# Patient Record
Sex: Male | Born: 1950 | Race: White | Hispanic: No | Marital: Married | Smoking: Former smoker
Health system: Southern US, Community
[De-identification: ages and names within clinical notes are randomized; demographics above are authoritative.]

## PROBLEM LIST (undated history)

## (undated) DIAGNOSIS — I739 Peripheral vascular disease, unspecified: Secondary | ICD-10-CM

## (undated) DIAGNOSIS — I714 Abdominal aortic aneurysm, without rupture, unspecified: Secondary | ICD-10-CM

## (undated) DIAGNOSIS — I10 Essential (primary) hypertension: Secondary | ICD-10-CM

## (undated) DIAGNOSIS — I509 Heart failure, unspecified: Secondary | ICD-10-CM

## (undated) DIAGNOSIS — I214 Non-ST elevation (NSTEMI) myocardial infarction: Secondary | ICD-10-CM

## (undated) DIAGNOSIS — D696 Thrombocytopenia, unspecified: Secondary | ICD-10-CM

## (undated) DIAGNOSIS — IMO0001 Reserved for inherently not codable concepts without codable children: Secondary | ICD-10-CM

## (undated) DIAGNOSIS — Z8719 Personal history of other diseases of the digestive system: Secondary | ICD-10-CM

## (undated) DIAGNOSIS — Z720 Tobacco use: Secondary | ICD-10-CM

## (undated) DIAGNOSIS — I2510 Atherosclerotic heart disease of native coronary artery without angina pectoris: Secondary | ICD-10-CM

## (undated) HISTORY — DX: Atherosclerotic heart disease of native coronary artery without angina pectoris: I25.10

## (undated) HISTORY — PX: CARDIAC CATHETERIZATION: SHX172

## (undated) HISTORY — DX: Heart failure, unspecified: I50.9

## (undated) HISTORY — PX: HERNIA REPAIR: SHX51

## (undated) HISTORY — PX: CORONARY STENT PLACEMENT: SHX1402

## (undated) HISTORY — DX: Abdominal aortic aneurysm, without rupture: I71.4

## (undated) HISTORY — DX: Peripheral vascular disease, unspecified: I73.9

## (undated) HISTORY — DX: Abdominal aortic aneurysm, without rupture, unspecified: I71.40

---

## 2007-02-10 ENCOUNTER — Ambulatory Visit: Payer: Self-pay | Admitting: Gastroenterology

## 2010-03-17 ENCOUNTER — Ambulatory Visit: Payer: Self-pay | Admitting: Surgery

## 2012-05-28 ENCOUNTER — Emergency Department (HOSPITAL_COMMUNITY)
Admission: EM | Admit: 2012-05-28 | Discharge: 2012-05-28 | Discharge status: Against Medical Advice | Payer: BC Managed Care – PPO | Attending: Emergency Medicine | Admitting: Emergency Medicine

## 2012-05-28 ENCOUNTER — Inpatient Hospital Stay (HOSPITAL_COMMUNITY)
Admission: EM | Admit: 2012-05-28 | Discharge: 2012-05-31 | DRG: 808 | Disposition: A | Discharge status: Home / Self Care | Payer: BC Managed Care – PPO | Attending: Cardiology | Admitting: Cardiology

## 2012-05-28 ENCOUNTER — Encounter (HOSPITAL_COMMUNITY): Payer: Self-pay

## 2012-05-28 ENCOUNTER — Emergency Department (HOSPITAL_COMMUNITY): Payer: BC Managed Care – PPO

## 2012-05-28 DIAGNOSIS — R079 Chest pain, unspecified: Secondary | ICD-10-CM | POA: Insufficient documentation

## 2012-05-28 DIAGNOSIS — F17200 Nicotine dependence, unspecified, uncomplicated: Secondary | ICD-10-CM | POA: Diagnosis present

## 2012-05-28 DIAGNOSIS — Z955 Presence of coronary angioplasty implant and graft: Secondary | ICD-10-CM

## 2012-05-28 DIAGNOSIS — I214 Non-ST elevation (NSTEMI) myocardial infarction: Principal | ICD-10-CM

## 2012-05-28 DIAGNOSIS — I10 Essential (primary) hypertension: Secondary | ICD-10-CM

## 2012-05-28 DIAGNOSIS — Z8249 Family history of ischemic heart disease and other diseases of the circulatory system: Secondary | ICD-10-CM

## 2012-05-28 DIAGNOSIS — I2510 Atherosclerotic heart disease of native coronary artery without angina pectoris: Secondary | ICD-10-CM | POA: Diagnosis present

## 2012-05-28 DIAGNOSIS — I739 Peripheral vascular disease, unspecified: Secondary | ICD-10-CM | POA: Diagnosis present

## 2012-05-28 DIAGNOSIS — D72819 Decreased white blood cell count, unspecified: Secondary | ICD-10-CM | POA: Diagnosis not present

## 2012-05-28 DIAGNOSIS — Z720 Tobacco use: Secondary | ICD-10-CM

## 2012-05-28 DIAGNOSIS — D696 Thrombocytopenia, unspecified: Secondary | ICD-10-CM | POA: Diagnosis present

## 2012-05-28 HISTORY — DX: Tobacco use: Z72.0

## 2012-05-28 HISTORY — DX: Essential (primary) hypertension: I10

## 2012-05-28 HISTORY — DX: Thrombocytopenia, unspecified: D69.6

## 2012-05-28 HISTORY — DX: Non-ST elevation (NSTEMI) myocardial infarction: I21.4

## 2012-05-28 LAB — POCT I-STAT TROPONIN I
Troponin i, poc: 0.12 ng/mL (ref 0.00–0.08)
Troponin i, poc: 3.67 ng/mL (ref 0.00–0.08)

## 2012-05-28 LAB — CK TOTAL AND CKMB (NOT AT ARMC)
CK, MB: 19.5 ng/mL (ref 0.3–4.0)
CK, MB: 14.4 ng/mL (ref 0.3–4.0)
Relative Index: 8.7 — ABNORMAL HIGH (ref 0.0–2.5)
Total CK: 199 U/L (ref 7–232)
Total CK: 165 U/L (ref 7–232)

## 2012-05-28 LAB — CBC
MCH: 37.1 pg — ABNORMAL HIGH (ref 26.0–34.0)
MCHC: 35.4 g/dL (ref 30.0–36.0)
MCV: 104.8 fL — ABNORMAL HIGH (ref 78.0–100.0)
MCV: 104.7 fL — ABNORMAL HIGH (ref 78.0–100.0)
Platelets: 131 10*3/uL — ABNORMAL LOW (ref 150–400)
Platelets: 116 10*3/uL — ABNORMAL LOW (ref 150–400)
RBC: 3.97 MIL/uL — ABNORMAL LOW (ref 4.22–5.81)
RDW: 12.7 % (ref 11.5–15.5)
RDW: 12.8 % (ref 11.5–15.5)
WBC: 4.1 10*3/uL (ref 4.0–10.5)

## 2012-05-28 LAB — HEPARIN LEVEL (UNFRACTIONATED): Heparin Unfractionated: 0.34 IU/mL (ref 0.30–0.70)

## 2012-05-28 LAB — COMPREHENSIVE METABOLIC PANEL
ALT: 24 U/L (ref 0–53)
AST: 38 U/L — ABNORMAL HIGH (ref 0–37)
Albumin: 3.7 g/dL (ref 3.5–5.2)
Alkaline Phosphatase: 98 U/L (ref 39–117)
CO2: 24 mEq/L (ref 19–32)
Chloride: 96 mEq/L (ref 96–112)
GFR calc non Af Amer: 54 mL/min — ABNORMAL LOW (ref >90)
Potassium: 3.9 mEq/L (ref 3.5–5.1)
Sodium: 134 mEq/L — ABNORMAL LOW (ref 135–145)
Total Bilirubin: 0.4 mg/dL (ref 0.3–1.2)

## 2012-05-28 LAB — URINALYSIS, ROUTINE W REFLEX MICROSCOPIC
Glucose, UA: NEGATIVE mg/dL
Hgb urine dipstick: NEGATIVE
Leukocytes, UA: NEGATIVE
Protein, ur: NEGATIVE mg/dL
Specific Gravity, Urine: 1.005 (ref 1.005–1.030)

## 2012-05-28 LAB — HEMOGLOBIN A1C: Hgb A1c MFr Bld: 5 % (ref <5.7)

## 2012-05-28 LAB — TSH: TSH: 2.018 u[IU]/mL (ref 0.350–4.500)

## 2012-05-28 LAB — BASIC METABOLIC PANEL
CO2: 25 mEq/L (ref 19–32)
Chloride: 97 mEq/L (ref 96–112)
Creatinine, Ser: 1.34 mg/dL (ref 0.50–1.35)
GFR calc Af Amer: 64 mL/min — ABNORMAL LOW (ref >90)
Potassium: 3.8 mEq/L (ref 3.5–5.1)

## 2012-05-28 LAB — TROPONIN I: Troponin I: 0.33 ng/mL (ref <0.30)

## 2012-05-28 MED ORDER — NICOTINE 14 MG/24HR TD PT24
14.0000 mg | MEDICATED_PATCH | Freq: Every day | TRANSDERMAL | Status: DC
  Administered 2012-05-28 – 2012-05-31 (×3): 14 mg via TRANSDERMAL
  Filled 2012-05-28 (×4): qty 1

## 2012-05-28 MED ORDER — CLOPIDOGREL BISULFATE 300 MG PO TABS
300.0000 mg | ORAL_TABLET | Freq: Once | ORAL | Status: AC
  Administered 2012-05-28: 300 mg via ORAL
  Filled 2012-05-28: qty 1

## 2012-05-28 MED ORDER — HEPARIN (PORCINE) IN NACL 100-0.45 UNIT/ML-% IJ SOLN: 12.0000 [IU]/kg/h | INTRAMUSCULAR | Status: DC

## 2012-05-28 MED ORDER — ONDANSETRON HCL 4 MG/2ML IJ SOLN: 4.0000 mg | Freq: Four times a day (QID) | INTRAMUSCULAR | Status: DC | PRN

## 2012-05-28 MED ORDER — ACETAMINOPHEN 325 MG PO TABS
650.0000 mg | ORAL_TABLET | Freq: Once | ORAL | Status: AC
  Administered 2012-05-28: 650 mg via ORAL
  Filled 2012-05-28: qty 2

## 2012-05-28 MED ORDER — ATORVASTATIN CALCIUM 80 MG PO TABS
80.0000 mg | ORAL_TABLET | Freq: Every day | ORAL | Status: DC
  Administered 2012-05-28 – 2012-05-29 (×2): 80 mg via ORAL
  Filled 2012-05-28 (×3): qty 1

## 2012-05-28 MED ORDER — METOPROLOL TARTRATE 1 MG/ML IV SOLN
5.0000 mg | Freq: Once | INTRAVENOUS | Status: AC
  Administered 2012-05-28: 5 mg via INTRAVENOUS
  Filled 2012-05-28: qty 5

## 2012-05-28 MED ORDER — METOPROLOL TARTRATE 1 MG/ML IV SOLN
5.0000 mg | INTRAVENOUS | Status: AC | PRN
  Administered 2012-05-28 (×3): 5 mg via INTRAVENOUS
  Filled 2012-05-28: qty 15

## 2012-05-28 MED ORDER — LORAZEPAM 1 MG PO TABS: 1.0000 mg | ORAL_TABLET | Freq: Four times a day (QID) | ORAL | Status: DC | PRN

## 2012-05-28 MED ORDER — ACETAMINOPHEN 325 MG PO TABS
650.0000 mg | ORAL_TABLET | ORAL | Status: DC | PRN
  Administered 2012-05-28 – 2012-05-29 (×5): 650 mg via ORAL
  Filled 2012-05-28 (×5): qty 2

## 2012-05-28 MED ORDER — HEPARIN (PORCINE) IN NACL 100-0.45 UNIT/ML-% IJ SOLN
750.0000 [IU]/h | INTRAMUSCULAR | Status: DC
  Administered 2012-05-28 – 2012-05-29 (×2): 750 [IU]/h via INTRAVENOUS
  Filled 2012-05-28 (×4): qty 250

## 2012-05-28 MED ORDER — NITROGLYCERIN IN D5W 200-5 MCG/ML-% IV SOLN
20.0000 ug/min | Freq: Once | INTRAVENOUS | Status: AC
  Administered 2012-05-28: 20 ug/min via INTRAVENOUS
  Filled 2012-05-28: qty 250

## 2012-05-28 MED ORDER — NITROGLYCERIN IN D5W 200-5 MCG/ML-% IV SOLN
20.0000 ug/min | INTRAVENOUS | Status: DC
  Administered 2012-05-30: 10 ug/min via INTRAVENOUS
  Filled 2012-05-28: qty 250

## 2012-05-28 MED ORDER — EPTIFIBATIDE 75 MG/100ML IV SOLN
1.0000 ug/kg/min | INTRAVENOUS | Status: DC
  Administered 2012-05-28 – 2012-05-29 (×3): 1 ug/kg/min via INTRAVENOUS
  Filled 2012-05-28 (×6): qty 100

## 2012-05-28 MED ORDER — LABETALOL HCL 5 MG/ML IV SOLN: 20.0000 mg | Freq: Four times a day (QID) | INTRAVENOUS | Status: DC | PRN

## 2012-05-28 MED ORDER — LISINOPRIL 5 MG PO TABS
5.0000 mg | ORAL_TABLET | Freq: Every day | ORAL | Status: DC
  Administered 2012-05-28 – 2012-05-31 (×3): 5 mg via ORAL
  Filled 2012-05-28 (×4): qty 1

## 2012-05-28 MED ORDER — HEPARIN BOLUS VIA INFUSION: 60.0000 [IU]/kg | Freq: Once | INTRAVENOUS | Status: DC

## 2012-05-28 MED ORDER — ONDANSETRON HCL 4 MG PO TABS: 4.0000 mg | ORAL_TABLET | Freq: Four times a day (QID) | ORAL | Status: DC | PRN

## 2012-05-28 MED ORDER — HEPARIN BOLUS VIA INFUSION
4000.0000 [IU] | Freq: Once | INTRAVENOUS | Status: AC
  Administered 2012-05-28: 4000 [IU] via INTRAVENOUS

## 2012-05-28 MED ORDER — METOPROLOL TARTRATE 25 MG PO TABS: 25.0000 mg | ORAL_TABLET | Freq: Two times a day (BID) | ORAL | Status: DC

## 2012-05-28 MED ORDER — CARVEDILOL 3.125 MG PO TABS
3.1250 mg | ORAL_TABLET | Freq: Two times a day (BID) | ORAL | Status: DC
  Administered 2012-05-28 – 2012-05-30 (×5): 3.125 mg via ORAL
  Filled 2012-05-28 (×8): qty 1

## 2012-05-28 NOTE — Progress Notes (Signed)
ANTICOAGULATION CONSULT NOTE - Follow-up  Pharmacy Consult for heparin + integrilin Indication: chest pain/ACS  Allergies  Allergen Reactions  . Codeine Itching and Rash    Patient Measurements: Height: 5\' 11"  (180.3 cm) Weight: 140 lb (63.504 kg) IBW/kg (Calculated) : 75.3  Heparin Dosing Weight: 64 kg  Vital Signs: Temp: 98.9 F (37.2 C) (09/21 1954) Temp src: Oral (09/21 1954) BP: 147/91 mmHg (09/21 1954) Pulse Rate: 74  (09/21 1954)  Labs:  Basename 05/28/12 2002 05/28/12 1830 05/28/12 1204 05/28/12 1203 05/28/12 0538 05/28/12 0512 05/28/12 0355  HGB 13.5 -- -- -- -- -- 14.7  HCT 38.1* -- -- -- -- -- 41.6  PLT 116* -- -- -- -- -- 131*  APTT -- -- -- -- 36 -- --  LABPROT -- -- -- -- 13.1 -- --  INR -- -- -- -- 1.00 -- --  HEPARINUNFRC 0.30 -- 0.34 -- -- -- --  CREATININE -- -- -- -- 1.38* -- 1.34  CKTOTAL -- 165 -- 199 -- -- --  CKMB -- 14.4* -- 19.5* -- -- --  TROPONINI -- 12.82* -- 12.69* -- 0.33* --    Estimated Creatinine Clearance: 50.5 ml/min (by C-G formula based on Cr of 1.38).  Assessment: 61 yo male presented with chest pain and +troponin. Pt continues on integrilin + heparin. Heparin level remains therapeutic at 0.3. No bleeding noted. H/H and plts with slight trend down. Will need to watch closely  Goal of Therapy:  INR 2-3 Heparin level 0.3-0.7 units/ml Monitor platelets by anticoagulation protocol: Yes   Plan:  1. Continue heparin at 750units/hr 2. Continue integrilin at 52mcg/kg/min 3. F/u AM heparin level and CBC  Jashay Roddy, Drake Leach 05/28/2012,9:09 PM

## 2012-05-28 NOTE — ED Provider Notes (Signed)
History     CSN: [id number]  Arrival date & time 05/28/12  0340   First MD Initiated Contact with Patient 05/28/12 0402      Chief Complaint  Patient presents with  . Chest Pain    (Consider location/radiation/quality/duration/timing/severity/associated sxs/prior treatment) HPI  Please note that this is a late entry. This patient was seen and evaluated by me shortly after his presentation to the emergency department with complaints of chest pain.  The patient is a 60 year old man has not received any regular medical care. He has history of hypertension and was prescribed metoprolol in December of 2011 but has not taken any of his prescribed medication. He smokes one pack per day of cigarettes and has a 45-pack-year smoking history. He also notes that his mother had early coronary artery disease.  The patient awoke from sleep about 2 hours prior to his presentation with severe, 10/10, aching left-sided chest pain which radiates to left shoulder. He had associated diaphoresis as well as nausea and shortness of breath. The patient said he waited before calling 911 in hopes that his pain would resolve but, it did not. When EMS responded, the patient was treated with 325 mg of aspirin as well as three 0.4 mg sublingual nitroglycerin tablets. The patient said that this helped but did not believe his chest pain. At the time of my evaluation he described his pain as 8/10.   Patient notes that he experienced similar but less severe pain about 6 months ago but did not seek treatment. At that time, the pain lasted several hours and resolved without intervention.  Past Medical History  Diagnosis Date  . Hypertension     No past surgical history on file.  No family history on file.  History  Substance Use Topics  . Smoking status: Current Every Day Smoker  . Smokeless tobacco: Not on file  . Alcohol Use: Yes   Patient drinks about 4 to 5 mixed drinks per day along with a couple of 12 oz  beers.    Review of Systems Gen: no weight loss, fevers, chills, night sweats Eyes: no discharge or drainage, no occular pain or visual changes Nose: no epistaxis or rhinorrhea Mouth: no dental pain, no sore throat Neck: no neck pain Lungs: As per history of present illness, otherwise negative CV: As per history of present illness, otherwise negative Abd: no abdominal pain, nausea, vomiting GU: no dysuria or gross hematuria MSK: no myalgias or arthralgias Neuro: no headache, no focal neurologic deficits Skin: no rash Psyche: negative.   Allergies  Codeine  Home Medications   Current Outpatient Rx  Name Route Sig Dispense Refill  . BC FAST PAIN RELIEF PO Oral Take 1 Package by mouth every 8 (eight) hours as needed. Headache or pain    . POLYETHYLENE GLYCOL 3350 PO PACK Oral Take 17 g by mouth daily as needed. constipation      BP 164/92  Pulse 72  Temp 97.8 F (36.6 C) (Oral)  Resp 14  Ht 5\' 11"  (1.803 m)  Wt 140 lb (63.504 kg)  BMI 19.53 kg/m2  SpO2 100%  Physical Exam  Gen: well developed and well nourished appearing, appears uncomfortable Head: NCAT Eyes: PERL, EOMI Nose: no epistaixis or rhinorrhea Mouth/throat: mucosa is moist and pink Neck: supple, no stridor Lungs: CTA B, no wheezing, rhonchi or rales, RR 24/min CV: RRR, no murmur, extremities are well perfused.  Abd: soft, notender, nondistended Back: no ttp, no cva ttp Skin: no rashese, wnl  Neuro: CN ii-xii grossly intact, no focal deficits Psyche; normal affect,  calm and cooperative.   ED Course  Procedures (including critical care time)  Results for orders placed during the hospital encounter of 05/28/12 (from the past 24 hour(s))  CBC     Status: Abnormal   Collection Time   05/28/12  3:55 AM      Component Value Range   WBC 4.1  4.0 - 10.5 K/uL   RBC 3.97 (*) 4.22 - 5.81 MIL/uL   Hemoglobin 14.7  13.0 - 17.0 g/dL   HCT 16.1  09.6 - 04.5 %   MCV 104.8 (*) 78.0 - 100.0 fL   MCH 37.0 (*)  26.0 - 34.0 pg   MCHC 35.3  30.0 - 36.0 g/dL   RDW 40.9  81.1 - 91.4 %   Platelets 131 (*) 150 - 400 K/uL  BASIC METABOLIC PANEL     Status: Abnormal   Collection Time   05/28/12  3:55 AM      Component Value Range   Sodium 135  135 - 145 mEq/L   Potassium 3.8  3.5 - 5.1 mEq/L   Chloride 97  96 - 112 mEq/L   CO2 25  19 - 32 mEq/L   Glucose, Bld 81  70 - 99 mg/dL   BUN 8  6 - 23 mg/dL   Creatinine, Ser 7.82  0.50 - 1.35 mg/dL   Calcium 9.3  8.4 - 95.6 mg/dL   GFR calc non Af Amer 56 (*) >90 mL/min   GFR calc Af Amer 64 (*) >90 mL/min  TROPONIN I     Status: Abnormal   Collection Time   05/28/12  5:12 AM      Component Value Range   Troponin I 0.33 (*) <0.30 ng/mL  POCT I-STAT TROPONIN I     Status: Abnormal   Collection Time   05/28/12  5:27 AM      Component Value Range   Troponin i, poc 0.12 (*) 0.00 - 0.08 ng/mL   Comment NOTIFIED PHYSICIAN     Comment 3           COMPREHENSIVE METABOLIC PANEL     Status: Abnormal   Collection Time   05/28/12  5:38 AM      Component Value Range   Sodium 134 (*) 135 - 145 mEq/L   Potassium 3.9  3.5 - 5.1 mEq/L   Chloride 96  96 - 112 mEq/L   CO2 24  19 - 32 mEq/L   Glucose, Bld 79  70 - 99 mg/dL   BUN 8  6 - 23 mg/dL   Creatinine, Ser 2.13 (*) 0.50 - 1.35 mg/dL   Calcium 9.4  8.4 - 08.6 mg/dL   Total Protein 7.4  6.0 - 8.3 g/dL   Albumin 3.7  3.5 - 5.2 g/dL   AST 38 (*) 0 - 37 U/L   ALT 24  0 - 53 U/L   Alkaline Phosphatase 98  39 - 117 U/L   Total Bilirubin 0.4  0.3 - 1.2 mg/dL   GFR calc non Af Amer 54 (*) >90 mL/min   GFR calc Af Amer 62 (*) >90 mL/min  MAGNESIUM     Status: Normal   Collection Time   05/28/12  5:38 AM      Component Value Range   Magnesium 2.0  1.5 - 2.5 mg/dL  PROTIME-INR     Status: Normal   Collection Time   05/28/12  5:38 AM  Component Value Range   Prothrombin Time 13.1  11.6 - 15.2 seconds   INR 1.00  0.00 - 1.49  APTT     Status: Normal   Collection Time   05/28/12  5:38 AM      Component Value  Range   aPTT 36  24 - 37 seconds  URINALYSIS, ROUTINE W REFLEX MICROSCOPIC     Status: Normal   Collection Time   05/28/12  5:57 AM      Component Value Range   Color, Urine YELLOW  YELLOW   APPearance CLEAR  CLEAR   Specific Gravity, Urine 1.005  1.005 - 1.030   pH 6.0  5.0 - 8.0   Glucose, UA NEGATIVE  NEGATIVE mg/dL   Hgb urine dipstick NEGATIVE  NEGATIVE   Bilirubin Urine NEGATIVE  NEGATIVE   Ketones, ur NEGATIVE  NEGATIVE mg/dL   Protein, ur NEGATIVE  NEGATIVE mg/dL   Urobilinogen, UA 0.2  0.0 - 1.0 mg/dL   Nitrite NEGATIVE  NEGATIVE   Leukocytes, UA NEGATIVE  NEGATIVE    Dg Chest Portable 1 View  05/28/2012  *RADIOLOGY REPORT*  Clinical Data: Chest pain  PORTABLE CHEST - 1 VIEW  Comparison: None  Findings: The heart size and mediastinal contours are within normal limits.  Both lungs are clear.  The visualized skeletal structures are unremarkable.  IMPRESSION: Negative exam.   Original Report Authenticated By: Rosealee Albee, M.D.      EKG: NSR, normal axis, normal intervals, Q waves in leads V1 and V2 suggest prior MI, ST depression in inferolateral leads is concerning for acute ischemia. No olds for comparison  DDX: ACS, pneumothorax, pneumonia, pericardial or pleural effusion, gastritis, GERD/PUD, musculoskeletal pain.     MDM  This is a high risk patient with sx consistent with ACS and dynamic EKG changes along with positive troponin. We are treating for NSTEMI. Patient received aspirin prior to arrival. Started on a nitroglycerin drip at 20 mcg per minute. This has been titrated to chest pain free. The patient is currently chest pain-free. He has received metoprolol 5 mg in 5 minute increments for a total of 3 doses along with Plavix and heparin load followed by gtt.    I consulted Dr. Tresa Endo, on call Cardiologist, by phone. He declined to evaluate the patient in the ED but asked that we admit the patient to the [facility] on call. He agrees with ED management and  has no further recommendations regarding treatment.    Call placed to [facility] at [phone].  Call back received from Dr. Lovell Sheehan at [phone].  I gave Dr. Lovell Sheehan a thorough presentation regarding this patient and asked her to admit the patient with plan for Cardiology consultation.  She stated that she would be seen by the oncoming service after 0700.  I called the Cardiology service back and spoke with PA, Corine Shelter, and asked that their service consult in the ED. He reports that oncoming Cardiology team will see the patient at 0730.   Of note, patient has near resolution of ST depression in inferolateral leads with adequate treatment of chest pain.   CRITICAL CARE Performed by: Brandt Loosen   Total critical care time: 40  Critical care time was exclusive of separately billable procedures and treating other patients.  Critical care was necessary to treat or prevent imminent or life-threatening deterioration.  Critical care was time spent personally by me on the following activities: development of treatment plan with patient and/or surrogate as well  as nursing, discussions with consultants, evaluation of patient's response to treatment, examination of patient, obtaining history from patient or surrogate, ordering and performing treatments and interventions, ordering and review of laboratory studies, ordering and review of radiographic studies, pulse oximetry and re-evaluation of patient's condition.         Brandt Loosen, MD 05/28/12 [phone]

## 2012-05-28 NOTE — ED Notes (Signed)
Troponin reported to dr Lavella Lemons. 0.33

## 2012-05-28 NOTE — ED Notes (Signed)
Pt presents with chest pain onset last night while asleep, sts center of chest and pressure like, making left arm numb, hx of htn, ekg with ems noted depression and ems reports onset of septal elevation.

## 2012-05-28 NOTE — Progress Notes (Signed)
ANTICOAGULATION CONSULT NOTE - Initial Consult  Pharmacy Consult for heparin  Indication: chest pain/ACS  Allergies  Allergen Reactions  . Codeine Itching and Rash    Patient Measurements: Height: 5\' 11"  (180.3 cm) Weight: 140 lb (63.504 kg) IBW/kg (Calculated) : 75.3  Heparin Dosing Weight: 64 kg  Vital Signs: Temp: 97.8 F (36.6 C) (09/21 0344) Temp src: Oral (09/21 0344) BP: 164/92 mmHg (09/21 0600) Pulse Rate: 72  (09/21 0600)  Labs:  Alvira Philips 05/28/12 0538 05/28/12 0512 05/28/12 0355  HGB -- -- 14.7  HCT -- -- 41.6  PLT -- -- 131*  APTT 36 -- --  LABPROT 13.1 -- --  INR 1.00 -- --  HEPARINUNFRC -- -- --  CREATININE -- -- 1.34  CKTOTAL -- -- --  CKMB -- -- --  TROPONINI -- 0.33* --    Estimated Creatinine Clearance: 52 ml/min (by C-G formula based on Cr of 1.34).   Medical History: Past Medical History  Diagnosis Date  . Hypertension     Medications:   (Not in a hospital admission) Scheduled:    . clopidogrel  300 mg Oral Once  . heparin  60 Units/kg Intravenous Once  . nitroGLYCERIN  20 mcg/min Intravenous Once    Assessment: 61 yo male presented with chest pain and +troponin. Pharmacy consulted to manage IV heparin.  Goal of Therapy:  INR 2-3 Heparin level 0.3-0.7 units/ml Monitor platelets by anticoagulation protocol: Yes   Plan:  1. Heparin 4000 units IV x 1, then IV infusion of 750 units/hr. 2. Heparin level in 6 hours.  3. Daily CBC, heparin level  Emeline Gins 05/28/2012,6:18 AM

## 2012-05-28 NOTE — ED Notes (Signed)
Pt here for complaints of chest pain by ems, pt sts woke from sleep with pain several hours ago

## 2012-05-28 NOTE — ED Notes (Signed)
Pt ambulated to restroom. 

## 2012-05-28 NOTE — Consult Note (Addendum)
Reason for Consult: NSTEMI  Referring Physician: ER MD and [facility]   Adam Lopez is an 61 y.o. male.    Chief Complaint: chest pain that awakened him from sleep   HPI: 61 year old white married male with no prior cardiac history presented to the emergency room this morning after developing chest pain that awakened him from sleep. Pain was a numbness in the left anterior chest radiation down his left arm left arm was numb as well. This was associated with nausea diaphoresis and shortness of breath. His EKG by EMS revealed ST depression in II,III and aVF as well as the lateral leads. The patient was given nitroglycerin by EMS with improvement of his chest pain. Here in the emergency room he is currently on IV heparin IV nitroglycerin at 20 mcgs/min and he is pain-free.  EKG now is improved with less ST depression.  Minutes several weeks ago he also had an episode of chest pain was not as severe and did not last long while he was awake.  History of hypertension but no longer medicated, He also has a family history of coronary disease with his mother and his sister.  Patient has received 325 mg of aspirin, 3--0.4 mg sublingual nitroglycerin and 5 mg of IV Lopressor x3.  Past Medical History  Diagnosis Date  . Hypertension   . NSTEMI (non-ST elevated myocardial infarction). 05/28/12 05/28/2012  . HTN (hypertension) 05/28/2012  . Tobacco abuse 05/28/2012    Past Surgical History  Procedure Date  . Hernia repair     Family History  Problem Relation Age of Onset  . Coronary artery disease Mother   . Coronary artery disease Sister    Social History:  reports that he has been smoking Cigarettes.  He has never used smokeless tobacco. He reports that he drinks about 25.2 ounces of alcohol per week. He reports that he does not use illicit drugs. He is married and unemployed.  He smokes One pack per day and has for many many years  Allergies:  Allergies  Allergen Reactions  . Codeine  Itching and Rash    Outpatient medications:  Prilosec 20 mg daily  MiraLAX 17 g in liquid as needed for constipation BC powders as needed   Results for orders placed during the hospital encounter of 05/28/12 (from the past 48 hour(s))  CBC     Status: Abnormal   Collection Time   05/28/12  3:55 AM      Component Value Range Comment   WBC 4.1  4.0 - 10.5 K/uL    RBC 3.97 (*) 4.22 - 5.81 MIL/uL    Hemoglobin 14.7  13.0 - 17.0 g/dL    HCT 16.1  09.6 - 04.5 %    MCV 104.8 (*) 78.0 - 100.0 fL    MCH 37.0 (*) 26.0 - 34.0 pg    MCHC 35.3  30.0 - 36.0 g/dL    RDW 40.9  81.1 - 91.4 %    Platelets 131 (*) 150 - 400 K/uL   BASIC METABOLIC PANEL     Status: Abnormal   Collection Time   05/28/12  3:55 AM      Component Value Range Comment   Sodium 135  135 - 145 mEq/L    Potassium 3.8  3.5 - 5.1 mEq/L    Chloride 97  96 - 112 mEq/L    CO2 25  19 - 32 mEq/L    Glucose, Bld 81  70 - 99 mg/dL    BUN  8  6 - 23 mg/dL    Creatinine, Ser 4.09  0.50 - 1.35 mg/dL    Calcium 9.3  8.4 - 81.1 mg/dL    GFR calc non Af Amer 56 (*) >90 mL/min    GFR calc Af Amer 64 (*) >90 mL/min   TROPONIN I     Status: Abnormal   Collection Time   05/28/12  5:12 AM      Component Value Range Comment   Troponin I 0.33 (*) <0.30 ng/mL   POCT I-STAT TROPONIN I     Status: Abnormal   Collection Time   05/28/12  5:27 AM      Component Value Range Comment   Troponin i, poc 0.12 (*) 0.00 - 0.08 ng/mL    Comment NOTIFIED PHYSICIAN      Comment 3            COMPREHENSIVE METABOLIC PANEL     Status: Abnormal   Collection Time   05/28/12  5:38 AM      Component Value Range Comment   Sodium 134 (*) 135 - 145 mEq/L    Potassium 3.9  3.5 - 5.1 mEq/L    Chloride 96  96 - 112 mEq/L    CO2 24  19 - 32 mEq/L    Glucose, Bld 79  70 - 99 mg/dL    BUN 8  6 - 23 mg/dL    Creatinine, Ser 9.14 (*) 0.50 - 1.35 mg/dL    Calcium 9.4  8.4 - 78.2 mg/dL    Total Protein 7.4  6.0 - 8.3 g/dL    Albumin 3.7  3.5 - 5.2 g/dL    AST 38  (*) 0 - 37 U/L    ALT 24  0 - 53 U/L    Alkaline Phosphatase 98  39 - 117 U/L    Total Bilirubin 0.4  0.3 - 1.2 mg/dL    GFR calc non Af Amer 54 (*) >90 mL/min    GFR calc Af Amer 62 (*) >90 mL/min   MAGNESIUM     Status: Normal   Collection Time   05/28/12  5:38 AM      Component Value Range Comment   Magnesium 2.0  1.5 - 2.5 mg/dL   PROTIME-INR     Status: Normal   Collection Time   05/28/12  5:38 AM      Component Value Range Comment   Prothrombin Time 13.1  11.6 - 15.2 seconds    INR 1.00  0.00 - 1.49   APTT     Status: Normal   Collection Time   05/28/12  5:38 AM      Component Value Range Comment   aPTT 36  24 - 37 seconds   URINALYSIS, ROUTINE W REFLEX MICROSCOPIC     Status: Normal   Collection Time   05/28/12  5:57 AM      Component Value Range Comment   Color, Urine YELLOW  YELLOW    APPearance CLEAR  CLEAR    Specific Gravity, Urine 1.005  1.005 - 1.030    pH 6.0  5.0 - 8.0    Glucose, UA NEGATIVE  NEGATIVE mg/dL    Hgb urine dipstick NEGATIVE  NEGATIVE    Bilirubin Urine NEGATIVE  NEGATIVE    Ketones, ur NEGATIVE  NEGATIVE mg/dL    Protein, ur NEGATIVE  NEGATIVE mg/dL    Urobilinogen, UA 0.2  0.0 - 1.0 mg/dL    Nitrite NEGATIVE  NEGATIVE  Leukocytes, UA NEGATIVE  NEGATIVE MICROSCOPIC NOT DONE ON URINES WITH NEGATIVE PROTEIN, BLOOD, LEUKOCYTES, NITRITE, OR GLUCOSE <1000 mg/dL.  POCT I-STAT TROPONIN I     Status: Abnormal   Collection Time   06/22/2012  8:03 AM      Component Value Range Comment   Troponin i, poc 3.67 (*) 0.00 - 0.08 ng/mL    Comment NOTIFIED PHYSICIAN      Comment 3             Dg Chest Portable 1 View  2012-06-22  *RADIOLOGY REPORT*  Clinical Data: Chest pain  PORTABLE CHEST - 1 VIEW  Comparison: None  Findings: The heart size and mediastinal contours are within normal limits.  Both lungs are clear.  The visualized skeletal structures are unremarkable.  IMPRESSION: Negative exam.   Original Report Authenticated By: Rosealee Albee, M.D.     ROS: General:No recent colds or fevers no recent weight loss Skin:No rashes or ulcers HEENT: no Blurred vision no double vision ZO:XWRUE pain as described  See HPI; Also notes claudication with walking L>R PUL: Shortness of breath with this chest pain this morning, continued tobacco use GI:The diarrhea constipation or melena, History of hemorrhoidectomy in further bleeding since that and GU:No hematuria or dysuria AV:[id number] arthritic pains Neuro:No syncope no lightheadedness no dizziness Endo:No diabetes or thyroid disease   Blood pressure 147/80, pulse 74, temperature 97.8 F (36.6 C), temperature source Oral, resp. rate 17, height 5\' 11"  (1.803 m), weight 63.504 kg (140 lb), SpO2 100.00%. PE: General: Alert oriented white male currently without pain no acute distress Skin:Warm and dry brisk capillary refill HEENT:Normocephalic sclera clear Neck:Supple no JVD no carotid bruit Heart:S1-S2 regular rate and rhythm without murmur gallop rub or click Lungs:Diminished in the bases with rhonchi and occasional crackles WGN:FAOZ non Tender positive bowel sounds do not palpate liver spleen or masses Ext:2+ pedal pulses bilaterally, 2+ pedal pulses bilaterally; bilateral Femoral bruits; R groin surgical scar from prior Herniorraphy Neuro:Alert and oriented moves all extremities follows commands   Assessment/Plan Active Problems:  NSTEMI (non-ST elevated myocardial infarction). 06/22/2012  HTN (hypertension)  Tobacco abuse  PLAN:  Pt. presents with non-ST elevation MI with inferior lateral ischemia on his EKG That has improved with treatment.  Currently on IV nitroglycerin and IV heparin Current peak troponin of 3.67.  Patient is currently pain-free. Patient was admitted by [facility], we will be glad to assume care of this cardiac patient. We'll plan cardiac catheterization on Monday unless patient becomes unstable in the meantime. Also evaluate cholesterol panel. Pt is adamant about  leaving the hospital, his family would like him to stay.  He stated he would sign out AMA.  We discussed the danger of leaving the hospital with evolving MI, and high risk of death.  INGOLD,LAURA R 06-22-2012, 8:26 AM  ATTENDING ATTESTATION:  I have seen and examined the patient along with Nada Boozer, NP.  I have reviewed the chart, notes and new data.  I agree with Laura's findings, examination & recommendations as noted above.  Brief Description: 61 year old chronic smoker with symptoms that sound like claudication, is hypertensive on monitor, and had at least one episode of exertional chest pain several weeks ago who presents now after being awakened by substernal chest discomfort with nausea and shortness of breath.  Initial ECG findings demonstrated inferior posterolateral ST depressions consistent with ischemia and his troponins are now positive consistent with non-ST elevation MI.  He also has a FH of CAD. With only  1 episode of Angina, his TIMI Risk score is 3 - moderate to high risk.     Key new complaints: Has HA from NTG  Key examination changes: I agree with findings above, no rales, no JVD c/w CHF.  Currently CP free, but BP elevated.  Key new findings / data: ECG & troponin noted.  Renal Fxn normal.  I spent at least 15 minutes (> 50% of the time in consultation) discussing his condition along with the options for care.  I feel that it is of paramount importance for him to remain in the hospital on intravenous nitroglycerin heparin to keep her chest pain-free. Any further episodes of chest pain with potentially warrant taking for cardiac catheterization lab urgently, otherwise if stable plan on catheterization on Monday morning. Will happily take them our service. Appreciate [facility] assistance with admission.  PLAN:  Admit to step down unit on IV heparin and nitroglycerin. He was loaded on a clopidogrel per ACS guidelines; would consider changing to Brilinta or  Prasugrel it PCI is warranted. For initial consultation Dr. Tresa Endo the patient was started on Integrilin drip which will continue.  Was administered IV beta blockers the emergency room and and start on standing beta blocker regimen.  We'll initiate statin therapy and check lipid panel.  2D Echocardiogram in the morning.  His long-term smoker, will use a NicoDerm Patch for smoking cessation counseling which I did spend at least 5 minutes discussing the importance of smoking cessation. He asked with a NicoDerm patch.  With his bilateral claudication, would recommend outpatient lower extremity Dopplers with ABIs on discharge.  Marykay Lex, M.D., M.S. [facility] [address] [place], [place]  [address]  [phone]  05/28/2012 11:17 AM

## 2012-05-28 NOTE — H&P (Signed)
[facility] History and Physical  Shriram Jin VQQ:[id number] DOB: [birth year] DOA: 05/28/2012   PCP: No primary provider on file.   Chief Complaint: chest pain this morning.   HPI: Adam Lopez is a 61 y.o. male came in to ED for substernal chest pain with radiation to the left arm associated with  Nausea, and shortness of breath. His EKG was abnormal with st t wave changes. Cardiology consult was obtained by ED and he was started on iv nitroglycerin and iv heparin for NSTEMI. His chest pain resolved and his EKG changes improved. He is being admitted for management of NSTEMI.   Review of Systems: The patient denies anorexia, fever, weight loss,, vision loss, decreased hearing, hoarseness, , syncope,, peripheral edema, balance deficits, hemoptysis, abdominal pain, melena, hematochezia, severe indigestion/heartburn, hematuria, incontinence, genital sores, muscle weakness, suspicious skin lesions, transient blindness, difficulty walking, depression, unusual weight change, abnormal bleeding, enlarged lymph nodes, angioedema, and breast masses.    Past Medical History  Diagnosis Date  . Hypertension    No past surgical history on file. Social History:  reports that he has been smoking.  He does not have any smokeless tobacco history on file. He reports that he drinks alcohol. He reports that he does not use illicit drugs.   Allergies  Allergen Reactions  . Codeine Itching and Rash    No family history on file.   Prior to Admission medications   Medication Sig Start Date End Date Taking? Authorizing Provider  Aspirin-Caffeine (BC FAST PAIN RELIEF PO) Take 1 Package by mouth every 8 (eight) hours as needed. Headache or pain   Yes Historical Provider, MD  polyethylene glycol (MIRALAX / GLYCOLAX) packet Take 17 g by mouth daily as needed. constipation   Yes Historical Provider, MD   Physical Exam: Filed Vitals:   05/28/12 0600 05/28/12 0615 05/28/12 0617 05/28/12 0630  BP:  164/92 150/91  147/80  Pulse: 72 71  74  Temp:      TempSrc:      Resp: 14 16  17   Height:   5\' 11"  (1.803 m)   Weight:   63.504 kg (140 lb)   SpO2: 100% 100%  100%    Constitutional: Vital signs reviewed.  Patient is a well-developed and well-nourished  in no acute distress and cooperative with exam. Alert and oriented x3.  Head: Normocephalic and atraumatic Ear: TM normal bilaterally Mouth: no erythema or exudates, MMM Eyes: PERRL, EOMI, conjunctivae normal, No scleral icterus.  Neck: Supple, Trachea midline normal ROM, No JVD, mass, thyromegaly, or carotid bruit present.  Cardiovascular: RRR, S1 normal, S2 normal, no MRG, pulses symmetric and intact bilaterally Pulmonary/Chest: CTAB, no wheezes, rales, or rhonchi Abdominal: Soft. Non-tender, non-distended, bowel sounds are normal, no masses, organomegaly, or guarding present.  Musculoskeletal: No joint deformities, erythema, or stiffness, ROM full and no nontender Hematology: no cervical, inginal, or axillary adenopathy.  Neurological: A&O x3, Strength is normal and symmetric bilaterally, cranial nerve II-XII are grossly intact, no focal motor deficit, sensory intact to light touch bilaterally.  Skin: Warm, dry and intact. No rash, cyanosis, or clubbing.  Psychiatric: Normal mood and affect.  Labs on Admission:  Basic Metabolic Panel:  Lab 05/28/12 4332 05/28/12 0355  NA 134* 135  K 3.9 3.8  CL 96 97  CO2 24 25  GLUCOSE 79 81  BUN 8 8  CREATININE 1.38* 1.34  CALCIUM 9.4 9.3  MG 2.0 --  PHOS -- --   Liver Function Tests:  Lab 05/28/12 9518  AST 38*  ALT 24  ALKPHOS 98  BILITOT 0.4  PROT 7.4  ALBUMIN 3.7   No results found for this basename: LIPASE:5,AMYLASE:5 in the last 168 hours No results found for this basename: AMMONIA:5 in the last 168 hours CBC:  Lab 05/28/12 0355  WBC 4.1  NEUTROABS --  HGB 14.7  HCT 41.6  MCV 104.8*  PLT 131*   Cardiac Enzymes:  Lab 05/28/12 0512  CKTOTAL --  CKMB --    CKMBINDEX --  TROPONINI 0.33*    BNP (last 3 results) No results found for this basename: PROBNP:3 in the last 8760 hours CBG: No results found for this basename: GLUCAP:5 in the last 168 hours  Radiological Exams on Admission: Dg Chest Portable 1 View  05/28/2012  *RADIOLOGY REPORT*  Clinical Data: Chest pain  PORTABLE CHEST - 1 VIEW  Comparison: None  Findings: The heart size and mediastinal contours are within normal limits.  Both lungs are clear.  The visualized skeletal structures are unremarkable.  IMPRESSION: Negative exam.   Original Report Authenticated By: Rosealee Albee, M.D.     EKG: ST T WAVE DEPRESSIONS  Assessment/Plan Active Problems:    1. Nstemi:  - admit to step down - continue with IV Heparin and IV nitroglycerin - continue with anti platelets medication - cardiology will take over the care as he might need possibly cardiac catheterization.  - 2 D echo pending.   2. Hypertension; controlled.  - resume b blockers.  3. DVT prophylaxis: on therapeutic anticoagulation.    Time spent: 40 min  Adam Lopez [facility] Pager [phone]  If 7PM-7AM, please contact night-coverage [email or web address] Password [facility] 05/28/2012, 8:03 AM

## 2012-05-28 NOTE — Progress Notes (Signed)
Pharmacy Consult - Heparin  First Heparin level therapeutic at 0.34 Goal range = 0.3-0.5 with Integrilin  Plan:  1) Continue heparin at current rate 2) Recheck heparin level with CBC for Integrilin  Thank you. Okey Regal, PharmD [phone]

## 2012-05-28 NOTE — Progress Notes (Signed)
  Echocardiogram 2D Echocardiogram has been performed.  Orva Gwaltney 05/28/2012, 3:25 PM

## 2012-05-28 NOTE — Progress Notes (Signed)
ANTICOAGULATION CONSULT NOTE - Follow Up Consult  Pharmacy Consult for integrillin Indication: NSTEMI  Allergies  Allergen Reactions  . Codeine Itching and Rash    Patient Measurements: Height: 5\' 11"  (180.3 cm) Weight: 140 lb (63.504 kg) IBW/kg (Calculated) : 75.3    Vital Signs: Temp: 97.8 F (36.6 C) (09/21 0935) Temp src: Oral (09/21 0935) BP: 166/89 mmHg (09/21 1200) Pulse Rate: 74  (09/21 1200)  Labs:  Basename 05/28/12 0538 05/28/12 0512 05/28/12 0355  HGB -- -- 14.7  HCT -- -- 41.6  PLT -- -- 131*  APTT 36 -- --  LABPROT 13.1 -- --  INR 1.00 -- --  HEPARINUNFRC -- -- --  CREATININE 1.38* -- 1.34  CKTOTAL -- -- --  CKMB -- -- --  TROPONINI -- 0.33* --    Estimated Creatinine Clearance: 50.5 ml/min (by C-G formula based on Cr of 1.38).   Medications:  Scheduled:    . acetaminophen  650 mg Oral Once  . atorvastatin  80 mg Oral q1800  . clopidogrel  300 mg Oral Once  . heparin  4,000 Units Intravenous Once  . metoprolol  5 mg Intravenous Once  . metoprolol tartrate  25 mg Oral BID  . nicotine  14 mg Transdermal Daily  . nitroGLYCERIN  20 mcg/min Intravenous Once  . DISCONTD: heparin  60 Units/kg Intravenous Once    Assessment: 61 yo male presented with chest pain with radiation to arm 10/10 and +troponin 0.33. Patient is smoker with med noncompliance. Pharmacy consulted to manage Iintegrillin and heparin. No noted bleeding plts 131. Crcl 50.29ml/min with scr trend up to 1.38 therefore will dose for crcl,<64ml/min. Will not bolus as patient is on heparin and received plavix load.  Goal of Therapy:  Heparin level 0.3-0.7 units/ml Monitor platelets by anticoagulation protocol: Yes   Plan:  -Initiate Integrillin infusion 55mcg/kg/min  -Follow up 8 hour CBC -Monitor for s/sx of bleeding  Bola A. Wandra Feinstein D Clinical Pharmacist Pager:[phone] Phone [phone] 05/28/2012 12:17 PM

## 2012-05-28 NOTE — ED Notes (Signed)
Pt was given ntg and asa by ems

## 2012-05-29 ENCOUNTER — Encounter (HOSPITAL_COMMUNITY): Payer: Self-pay | Admitting: Cardiology

## 2012-05-29 DIAGNOSIS — D72819 Decreased white blood cell count, unspecified: Secondary | ICD-10-CM | POA: Diagnosis not present

## 2012-05-29 DIAGNOSIS — D696 Thrombocytopenia, unspecified: Secondary | ICD-10-CM | POA: Diagnosis present

## 2012-05-29 HISTORY — DX: Thrombocytopenia, unspecified: D69.6

## 2012-05-29 LAB — BASIC METABOLIC PANEL
BUN: 13 mg/dL (ref 6–23)
Calcium: 9.3 mg/dL (ref 8.4–10.5)
Creatinine, Ser: 1.36 mg/dL — ABNORMAL HIGH (ref 0.50–1.35)
GFR calc non Af Amer: 55 mL/min — ABNORMAL LOW (ref >90)
Glucose, Bld: 88 mg/dL (ref 70–99)
Potassium: 4 mEq/L (ref 3.5–5.1)

## 2012-05-29 LAB — CBC
HCT: 37.1 % — ABNORMAL LOW (ref 39.0–52.0)
MCH: 37.1 pg — ABNORMAL HIGH (ref 26.0–34.0)
MCHC: 35 g/dL (ref 30.0–36.0)
MCV: 106 fL — ABNORMAL HIGH (ref 78.0–100.0)
RDW: 13 % (ref 11.5–15.5)

## 2012-05-29 LAB — LIPID PANEL
HDL: 55 mg/dL (ref >39)
LDL Cholesterol: 89 mg/dL (ref 0–99)

## 2012-05-29 MED ORDER — BUTALBITAL-APAP-CAFFEINE 50-325-40 MG PO TABS
2.0000 | ORAL_TABLET | Freq: Four times a day (QID) | ORAL | Status: DC | PRN
  Filled 2012-05-29: qty 2

## 2012-05-29 MED ORDER — ZOLPIDEM TARTRATE 5 MG PO TABS: 10.0000 mg | ORAL_TABLET | Freq: Every evening | ORAL | Status: DC | PRN

## 2012-05-29 MED ORDER — POLYETHYLENE GLYCOL 3350 17 G PO PACK
17.0000 g | PACK | Freq: Every day | ORAL | Status: DC | PRN
  Filled 2012-05-29: qty 1

## 2012-05-29 MED ORDER — ASPIRIN 81 MG PO CHEW
324.0000 mg | CHEWABLE_TABLET | ORAL | Status: AC
  Administered 2012-05-30: 324 mg via ORAL
  Filled 2012-05-29: qty 4

## 2012-05-29 MED ORDER — SODIUM CHLORIDE 0.9 % IJ SOLN: 3.0000 mL | INTRAMUSCULAR | Status: DC | PRN

## 2012-05-29 MED ORDER — SODIUM CHLORIDE 0.9 % IJ SOLN
3.0000 mL | Freq: Two times a day (BID) | INTRAMUSCULAR | Status: DC
  Administered 2012-05-29: 3 mL via INTRAVENOUS

## 2012-05-29 MED ORDER — SODIUM CHLORIDE 0.9 % IV SOLN: 250.0000 mL | INTRAVENOUS | Status: DC | PRN

## 2012-05-29 MED ORDER — SODIUM CHLORIDE 0.9 % IV SOLN
1.0000 mL/kg/h | INTRAVENOUS | Status: DC
  Administered 2012-05-29: 1 mL/kg/h via INTRAVENOUS

## 2012-05-29 MED ORDER — ASPIRIN 325 MG PO TABS
325.0000 mg | ORAL_TABLET | Freq: Every day | ORAL | Status: DC
  Administered 2012-05-29: 325 mg via ORAL
  Filled 2012-05-29 (×3): qty 1

## 2012-05-29 MED ORDER — DOCUSATE SODIUM 100 MG PO CAPS
100.0000 mg | ORAL_CAPSULE | Freq: Two times a day (BID) | ORAL | Status: DC
  Administered 2012-05-29 (×2): 100 mg via ORAL
  Filled 2012-05-29 (×6): qty 1

## 2012-05-29 MED ORDER — DIAZEPAM 5 MG PO TABS
5.0000 mg | ORAL_TABLET | ORAL | Status: AC
  Administered 2012-05-30: 5 mg via ORAL
  Filled 2012-05-29: qty 1

## 2012-05-29 NOTE — Progress Notes (Signed)
Subjective: No further chest pain More relaxed today about hospitalization  Objective: Vital signs in last 24 hours: Temp:  [97.8 F (36.6 C)-98.9 F (37.2 C)] 98.2 F (36.8 C) (09/22 0745) Pulse Rate:  [66-77] 72  (09/22 0329) Resp:  [16-20] 18  (09/22 0329) BP: (130-172)/(76-100) 136/76 mmHg (09/22 0329) SpO2:  [100 %] 100 % (09/22 0329) Weight change:  Last BM Date: 05/28/12 Intake/Output from previous day: -648 09/21 0701 - 09/22 0700 In: 1153.8 [P.O.:720; I.V.:433.8] Out: 1200 [Urine:1200] Intake/Output this shift:   PE: General:alert and oriented, pleasant affect Heart:S1S2 RRR Lungs:+ occ. Wheezes, no rales Abd:+ BS, soft non tender Ext:no edema, bilateral femoral bruit; R groin scar & tenderness from prior herniorraphy. Neuro: CN II-XII grossly intact.  Lab Results:  Basename 05/29/12 0445 05/28/12 2002  WBC 3.1* 2.8*  HGB 13.0 13.5  HCT 37.1* 38.1*  PLT 115* 116*   BMET  Basename 05/29/12 0445 05/28/12 0538  NA 133* 134*  K 4.0 3.9  CL 99 96  CO2 24 24  GLUCOSE 88 79  BUN 13 8  CREATININE 1.36* 1.38*  CALCIUM 9.3 9.4    Basename 05/28/12 2357 05/28/12 1830  TROPONINI 5.36* 12.82*    Lab Results  Component Value Date   CHOL 168 05/29/2012   HDL 55 05/29/2012   LDLCALC 89 05/29/2012   TRIG 122 05/29/2012   CHOLHDL 3.1 05/29/2012   Lab Results  Component Value Date   HGBA1C 5.0 05/28/2012     Lab Results  Component Value Date   TSH 2.018 05/28/2012    Hepatic Function Panel  Basename 05/28/12 0538  PROT 7.4  ALBUMIN 3.7  AST 38*  ALT 24  ALKPHOS 98  BILITOT 0.4  BILIDIR --  IBILI --    Basename 05/29/12 0445  CHOL 168   No results found for this basename: PROTIME in the last 72 hours    EKG: Orders placed during the hospital encounter of 05/28/12  . EKG 12-LEAD  . EKG 12-LEAD  . ED EKG  . ED EKG  . ED EKG  . ED EKG  . EKG 12-LEAD  . EKG 12-LEAD  . EKG 12-LEAD  . EKG 12-LEAD  . EKG 12-LEAD  . EKG 12-LEAD  . EKG  12-LEAD    Studies/Results: Dg Chest Portable 1 View  05/28/2012  *RADIOLOGY REPORT*  Clinical Data: Chest pain  PORTABLE CHEST - 1 VIEW  Comparison: None  Findings: The heart size and mediastinal contours are within normal limits.  Both lungs are clear.  The visualized skeletal structures are unremarkable.  IMPRESSION: Negative exam.   Original Report Authenticated By: Rosealee Albee, M.D.     Medications: I have reviewed the patient's current medications.    Marland Kitchen acetaminophen  650 mg Oral Once  . atorvastatin  80 mg Oral q1800  . carvedilol  3.125 mg Oral BID WC  . lisinopril  5 mg Oral Daily  . metoprolol  5 mg Intravenous Once  . nicotine  14 mg Transdermal Daily  . DISCONTD: metoprolol tartrate  25 mg Oral BID   Assessment/Plan: Principal Problem:  *NSTEMI (non-ST elevated myocardial infarction). 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  NTG is at 20 mcg  BP improved control.   WBC and plts. Decreased. ? Check HIT? Stop integrillin Pk troponin is 12.82, pk MB 19.5  EKG with LVH Continues  with NTG H/A will add Fioricet for head ache -- will consider weaning once his blood pressure comes down. Plan for cath in am. Given Plavix 300 on admit but not on daily Plavix ? Wait until after cath?  Have added daily ASA.   LOS: 1 day   INGOLD,LAURA R 05/29/2012, 7:59 AM  I seen and evaluated the patient along with Nada Boozer, NP. I agree with her findings, examination and assessment/plan. Overall he is doing much better today, his troponin levels peaked and are now declining. We're closer monitoring his CBCs as he has had a slight drop in platelet count as well as white count and hemoglobin. This is likely dilutional but we need to cautious as we can potentially stop the Integrilin if the levels continue to drop.  His blood pressure is  improving some we have added carvedilol and lisinopril. Passes allow [place] to decrease the nitroglycerin drip to mitigate his headaches. I would not want to completely stop it however. The nitroglycerin is giving him a headache  As per yesterday the plan will be for cardiac catheterization in the morning of possible PCI by Dr. Tresa Endo. Will prep both groins would prefer to go the left groin as he has had prior surgery in the right groin for herniorrhaphy and is somewhat tender.  He is on aspirin with Plavix being stopped as is now on Integrilin. Beta blocker and ACE inhibitor.  Long-term smoker on NicoDerm patch seems to be stable with no real COPD findings.  2-D echocardiogram was performed yesterday we'll look forward to read today. Bilateral femoral bruits with claudication left worse than right. We'll need to have ABIs/LEA Dopplers on discharge.  Marykay Lex, M.D., M.S. [facility] [address] [place], [place]  [address]  [phone] Pager # [phone]  05/29/2012 8:50 AM

## 2012-05-29 NOTE — Progress Notes (Signed)
ANTICOAGULATION CONSULT NOTE - Follow Up Consult  Pharmacy Consult for Heparin and Integrilin Indication: NSTEMI  Allergies  Allergen Reactions  . Codeine Itching and Rash   Vital Signs: Temp: 98.2 F (36.8 C) (09/22 0745) Temp src: Oral (09/22 0745) BP: 136/76 mmHg (09/22 0329) Pulse Rate: 72  (09/22 0329)  Labs:  Alvira Philips 05/29/12 0445 05/28/12 2357 05/28/12 2002 05/28/12 1830 05/28/12 1204 05/28/12 1203 05/28/12 0538 05/28/12 0355  HGB 13.0 -- 13.5 -- -- -- -- --  HCT 37.1* -- 38.1* -- -- -- -- 41.6  PLT 115* -- 116* -- -- -- -- 131*  APTT -- -- -- -- -- -- 36 --  LABPROT -- -- -- -- -- -- 13.1 --  INR -- -- -- -- -- -- 1.00 --  HEPARINUNFRC 0.37 -- 0.30 -- 0.34 -- -- --  CREATININE 1.36* -- -- -- -- -- 1.38* 1.34  CKTOTAL -- -- -- 165 -- 199 -- --  CKMB -- -- -- 14.4* -- 19.5* -- --  TROPONINI -- 5.36* -- 12.82* -- 12.69* -- --    Estimated Creatinine Clearance: 51.2 ml/min (by C-G formula based on Cr of 1.36).   Medications:  Heparin @ 750 units/hr Integrilin @ 74mcg/kg/min  Assessment: 61yom continues on heparin and integrilin for NSTEMI, awaiting CATH tomorrow. Heparin level is therapeutic. Platelets are low but stable for now. Hgb/Hct with slight trend down. No bleeding noted. Renal function stable.  Goal of Therapy:  Heparin level 0.3-0.5 units/ml Monitor platelets by anticoagulation protocol: Yes   Plan:  1) Continue heparin at 750 units/hr 2) Continue integrilin at 1 mcg/kg/min 3) Follow up heparin level and CBC in AM  Fredrik Rigger 05/29/2012,9:16 AM

## 2012-05-30 ENCOUNTER — Encounter (HOSPITAL_COMMUNITY)
Admission: EM | Disposition: A | Discharge status: Home / Self Care | Payer: Self-pay | Source: Home / Self Care | Attending: Cardiology

## 2012-05-30 HISTORY — PX: LEFT HEART CATHETERIZATION WITH CORONARY ANGIOGRAM: SHX5451

## 2012-05-30 LAB — DIFFERENTIAL
Eosinophils Absolute: 0.1 10*3/uL (ref 0.0–0.7)
Eosinophils Relative: 3 % (ref 0–5)
Lymphs Abs: 0.4 10*3/uL — ABNORMAL LOW (ref 0.7–4.0)
Monocytes Absolute: 0.4 10*3/uL (ref 0.1–1.0)
Monocytes Relative: 14 % — ABNORMAL HIGH (ref 3–12)

## 2012-05-30 LAB — BASIC METABOLIC PANEL
BUN: 15 mg/dL (ref 6–23)
Calcium: 9.4 mg/dL (ref 8.4–10.5)
GFR calc non Af Amer: 57 mL/min — ABNORMAL LOW (ref >90)
Glucose, Bld: 84 mg/dL (ref 70–99)
Sodium: 135 mEq/L (ref 135–145)

## 2012-05-30 LAB — CBC
HCT: 39.8 % (ref 39.0–52.0)
MCHC: 34.9 g/dL (ref 30.0–36.0)
MCV: 105.9 fL — ABNORMAL HIGH (ref 78.0–100.0)
RDW: 12.9 % (ref 11.5–15.5)

## 2012-05-30 SURGERY — LEFT HEART CATHETERIZATION WITH CORONARY ANGIOGRAM
Anesthesia: LOCAL

## 2012-05-30 MED ORDER — SODIUM CHLORIDE 0.9 % IV SOLN: INTRAVENOUS | Status: DC

## 2012-05-30 MED ORDER — BIVALIRUDIN 250 MG IV SOLR
INTRAVENOUS | Status: AC
  Filled 2012-05-30: qty 250

## 2012-05-30 MED ORDER — FENTANYL CITRATE 0.05 MG/ML IJ SOLN
INTRAMUSCULAR | Status: AC
  Filled 2012-05-30: qty 2

## 2012-05-30 MED ORDER — LIDOCAINE HCL (PF) 1 % IJ SOLN
INTRAMUSCULAR | Status: AC
  Filled 2012-05-30: qty 30

## 2012-05-30 MED ORDER — CLOPIDOGREL BISULFATE 75 MG PO TABS
75.0000 mg | ORAL_TABLET | Freq: Every day | ORAL | Status: DC
Start: 2012-05-31 — End: 2012-05-31
  Filled 2012-05-30: qty 1

## 2012-05-30 MED ORDER — SODIUM CHLORIDE 0.9 % IV SOLN: 0.2500 mg/kg/h | INTRAVENOUS | Status: AC

## 2012-05-30 MED ORDER — NITROGLYCERIN IN D5W 200-5 MCG/ML-% IV SOLN: 2.0000 ug/min | INTRAVENOUS | Status: DC

## 2012-05-30 MED ORDER — MIDAZOLAM HCL 2 MG/2ML IJ SOLN
INTRAMUSCULAR | Status: AC
  Filled 2012-05-30: qty 2

## 2012-05-30 MED ORDER — NITROGLYCERIN 0.2 MG/ML ON CALL CATH LAB
INTRAVENOUS | Status: AC
  Filled 2012-05-30: qty 1

## 2012-05-30 MED ORDER — ONDANSETRON HCL 4 MG/2ML IJ SOLN: 4.0000 mg | Freq: Four times a day (QID) | INTRAMUSCULAR | Status: DC | PRN

## 2012-05-30 MED ORDER — ACETAMINOPHEN 325 MG PO TABS
650.0000 mg | ORAL_TABLET | ORAL | Status: DC | PRN
  Administered 2012-05-30: 650 mg via ORAL
  Filled 2012-05-30: qty 2

## 2012-05-30 MED ORDER — CLOPIDOGREL BISULFATE 300 MG PO TABS
ORAL_TABLET | ORAL | Status: AC
  Filled 2012-05-30: qty 1

## 2012-05-30 MED ORDER — ATORVASTATIN CALCIUM 20 MG PO TABS
20.0000 mg | ORAL_TABLET | Freq: Every day | ORAL | Status: DC
  Administered 2012-05-30 (×2): 20 mg via ORAL
  Filled 2012-05-30 (×2): qty 1

## 2012-05-30 MED ORDER — BIVALIRUDIN BOLUS VIA INFUSION: 0.1000 mg/kg | Freq: Once | INTRAVENOUS | Status: DC

## 2012-05-30 MED ORDER — SODIUM CHLORIDE 0.9 % IV SOLN: 0.2500 mg/kg/h | INTRAVENOUS | Status: DC

## 2012-05-30 MED ORDER — ASPIRIN EC 81 MG PO TBEC
81.0000 mg | DELAYED_RELEASE_TABLET | Freq: Every day | ORAL | Status: DC
  Administered 2012-05-31: 81 mg via ORAL
  Filled 2012-05-30: qty 1

## 2012-05-30 NOTE — Progress Notes (Signed)
[facility] Progress Note  Subjective:  No further chest pain. No active bleeding.  Objective:   Vital Signs in the last 24 hours: Temp:  [97.6 F (36.4 C)-98.8 F (37.1 C)] 98.4 F (36.9 C) (09/23 0800) Pulse Rate:  [80-85] 85  (09/23 0800) Resp:  [10-22] 18  (09/23 0800) BP: (134-153)/(73-94) 153/89 mmHg (09/23 0800) SpO2:  [95 %-99 %] 95 % (09/23 0800)  Intake/Output from previous day: 09/22 0701 - 09/23 0700 In: 2491.8 [P.O.:1320; I.V.:1061.8; IV Piggyback:110] Out: 1928 [Urine:1925; Stool:3]  Scheduled:   . aspirin  324 mg Oral Pre-Cath  . aspirin  325 mg Oral Daily  . atorvastatin  20 mg Oral q1800  . bivalirudin      . carvedilol  3.125 mg Oral BID WC  . clopidogrel      . diazepam  5 mg Oral On Call  . docusate sodium  100 mg Oral BID  . fentaNYL      . lidocaine      . lisinopril  5 mg Oral Daily  . midazolam      . midazolam      . nicotine  14 mg Transdermal Daily  . nitroGLYCERIN      . sodium chloride  3 mL Intravenous Q12H  . DISCONTD: atorvastatin  80 mg Oral q1800    Physical Exam:   General appearance: alert, cooperative and no distress Neck: no JVD Lungs: clear to auscultation bilaterally Heart: regular rate and rhythm Abdomen: soft, non-tender; bowel sounds normal; no masses,  no organomegaly Extremities: no edema, redness or tenderness in the calves or thighs   Rate: 85  Rhythm: normal sinus rhythm  Lab Results:    Basename 05/30/12 0600 05/29/12 0445  NA 135 133*  K 4.2 4.0  CL 100 99  CO2 24 24  GLUCOSE 84 88  BUN 15 13  CREATININE 1.32 1.36*    Basename 05/28/12 2357 05/28/12 1830  TROPONINI 5.36* 12.82*   Hepatic Function Panel  Basename 05/28/12 0538  PROT 7.4  ALBUMIN 3.7  AST 38*  ALT 24  ALKPHOS 98  BILITOT 0.4  BILIDIR --  IBILI --    Basename 05/30/12 0600  INR 0.94    Lipid Panel     Component Value Date/Time   CHOL 168 05/29/2012 0445   TRIG 122 05/29/2012 0445   HDL 55 05/29/2012 0445   CHOLHDL 3.1 05/29/2012 0445   VLDL 24 05/29/2012 0445   LDLCALC 89 05/29/2012 0445     Imaging:  No results found.    Assessment/Plan:   Principal Problem:  *NSTEMI (non-ST elevated myocardial infarction). 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 Platelets decreased may be contributed by 2b3a inhibition. For cath and possible PCI today.  Lennette Bihari, MD, [facility] 05/30/2012, 11:39 AM

## 2012-05-30 NOTE — CV Procedure (Signed)
Cardiac catherization/PCI RCA  Adam Lopez, 61 y.o., male  Full note dictated; see diagram  DICTATION # [phone], [id number]  Ao: 135/81 LV:135/11  Calcified coronarries  LM: no sig luminal obstruction LAD: 205 prox and mid LCX; irregularites prox, 40 diffuse distal RCA: 99% just [prox to crux with 80% beyond crux  LV: hyperdynamic with near cavity obliteration  PCI RCA with ultimate 3.0 x 28 BMS Vision post dilated to 3.2 to 3.08 taper.  Tolerated well.  Lennette Bihari, MD, [facility] 05/30/2012 11:48 AM

## 2012-05-30 NOTE — Progress Notes (Signed)
54fr sheath removed from right groin. Manual pressure held for 25 minutes. Patient noted to have small amount of capillary oozing around insertion site. Small raised area lateral to insertion site noted, patient states "I had hernia surgery there a while ago". Patient verbalized understanding of post sheath removal instructions. +2 pedal pulses, vital signs stable.

## 2012-05-30 NOTE — Progress Notes (Signed)
Cath lab RN and [phone] RN at bedside for sheath pull. Pt had some oozing at groin site. Both RN's held pressure for 30 minutes. VSS. OOzing under control. Pt aware of post sheath removal instructions. Demonstrated. Will continue to monitor site. Right groin level 0.

## 2012-05-30 NOTE — Care Management Note (Addendum)
    Page 1 of 1   05/31/2012     10:05:55 AM   CARE MANAGEMENT NOTE 05/31/2012  Patient:  Adam Lopez, Adam Lopez   Account Number:  [id number]  Date Initiated:  05/30/2012  Documentation initiated by:  Junius Creamer  Subjective/Objective Assessment:   adm w mi     Action/Plan:   lives w wife   Anticipated DC Date:     Anticipated DC Plan:  HOME/SELF CARE      DC Planning Services  CM consult      Choice offered to / List presented to:             Status of service:   Medicare Important Message given?   (If response is "NO", the following Medicare IM given date fields will be blank) Date Medicare IM given:   Date Additional Medicare IM given:    Discharge Disposition:  HOME/SELF CARE  Per UR Regulation:  Reviewed for med. necessity/level of care/duration of stay  If discussed at Long Length of Stay Meetings, dates discussed:    Comments:  9/24 10am debbie Elieser Tetrick rn,bsn [phone] gave pt 30day free effient card and effient copay assist card. pt has 138.37 per month copay w [facility].  9/23 8:52a debbie Vandy Fong rn,bsn [phone]

## 2012-05-31 LAB — CBC
HCT: 37.3 % — ABNORMAL LOW (ref 39.0–52.0)
Hemoglobin: 12.9 g/dL — ABNORMAL LOW (ref 13.0–17.0)
MCH: 36.6 pg — ABNORMAL HIGH (ref 26.0–34.0)
MCHC: 34.6 g/dL (ref 30.0–36.0)
MCV: 106 fL — ABNORMAL HIGH (ref 78.0–100.0)
Platelets: 106 10*3/uL — ABNORMAL LOW (ref 150–400)
RBC: 3.52 MIL/uL — ABNORMAL LOW (ref 4.22–5.81)
RDW: 12.9 % (ref 11.5–15.5)
WBC: 4.7 10*3/uL (ref 4.0–10.5)

## 2012-05-31 LAB — BASIC METABOLIC PANEL
CO2: 22 mEq/L (ref 19–32)
Calcium: 9 mg/dL (ref 8.4–10.5)
Chloride: 99 mEq/L (ref 96–112)
Glucose, Bld: 68 mg/dL — ABNORMAL LOW (ref 70–99)
Potassium: 3.8 mEq/L (ref 3.5–5.1)
Sodium: 134 mEq/L — ABNORMAL LOW (ref 135–145)

## 2012-05-31 MED ORDER — PRASUGREL HCL 10 MG PO TABS: 10.0000 mg | ORAL_TABLET | Freq: Every day | ORAL | Status: DC

## 2012-05-31 MED ORDER — NITROGLYCERIN 0.4 MG SL SUBL: 0.4000 mg | SUBLINGUAL_TABLET | SUBLINGUAL | Status: DC | PRN

## 2012-05-31 MED ORDER — CARVEDILOL 6.25 MG PO TABS
6.2500 mg | ORAL_TABLET | Freq: Two times a day (BID) | ORAL | Status: DC
  Administered 2012-05-31: 6.25 mg via ORAL
  Filled 2012-05-31 (×3): qty 1

## 2012-05-31 MED ORDER — CARVEDILOL 6.25 MG PO TABS: 6.2500 mg | ORAL_TABLET | Freq: Two times a day (BID) | ORAL | Status: DC

## 2012-05-31 MED ORDER — LISINOPRIL 5 MG PO TABS: 5.0000 mg | ORAL_TABLET | Freq: Every day | ORAL | Status: DC

## 2012-05-31 MED ORDER — ATORVASTATIN CALCIUM 20 MG PO TABS: 20.0000 mg | ORAL_TABLET | Freq: Every day | ORAL | Status: DC

## 2012-05-31 MED ORDER — ASPIRIN 81 MG PO TBEC: 81.0000 mg | DELAYED_RELEASE_TABLET | Freq: Four times a day (QID) | ORAL | Status: DC | PRN

## 2012-05-31 MED FILL — Dextrose Inj 5%: INTRAVENOUS | Qty: 50 | Status: AC

## 2012-05-31 NOTE — Cardiovascular Report (Signed)
NAME:  Adam Lopez, Adam Lopez NO.:  [id number]  MEDICAL RECORD NO.:  [id number]  LOCATION:  2923                         FACILITY:  [facility]  PHYSICIAN:  Nicki Guadalajara, M.D.          DATE OF BIRTH:  DATE OF PROCEDURE: DATE OF DISCHARGE:                           CARDIAC CATHETERIZATION   PROCEDURE:  Left heart catheterization:  [facility] coronary angiography; left ventriculography; percutaneous coronary intervention with PTCA stenting of high-grade right coronary artery stenosis utilizing bare-metal stent.  INDICATIONS:  Mr. Adam Lopez is a 61 year old gentleman, who presented to [facility] on May 28, 2012.  He was found to have inferolateral ST-segment changes.  He subsequently has ruled in for a non ST-segment elevation myocardial infarction.  In the emergency room, he received 300 mg of Plavix by the ER physician.  He subsequently was started on Integrilin with resolution of his chest pain.  Troponin peaked at 12.  The patient was noted to develop some mild thrombocytopenia with initial platelet count at 131,000 dropping to 97,000.  He now presents for cardiac catheterization and possible intervention.  DESCRIPTION OF PROCEDURE:  After premedication with Versed 2 mg plus fentanyl 50 mcg, the patient was prepped and draped in usual fashion. His right femoral artery was punctured anteriorly, and initially a 5- [place] sheath was inserted without difficulty.  Diagnostic catheterization was done utilizing 5-French left coronary catheters.  A no-torque right catheter was necessary to selectively engage right coronary artery due to the FR4 catheter selectively engaging the conus branch.  A 5-French pigtail catheter was used for RAO ventriculography. With the demonstration of high-grade subtotal RCA stenosis with clot followed by diffuse narrowing, the decision was made to attempt intervention to this vessel.  The patient was still on Integrilin, low- dose infusion.   The decision was made in light of his platelets to consider using a bare-metal stent.  Plavix 300 mg was administered. Angiomax bolus plus infusion was given.  ACT was documented to be therapeutic.  The arterial sheath was upgraded to a 6-French sheath.  A 6-French sheath 3DRC guide was used.  An intuition wire was advanced down the right coronary artery.  Initial dilatation was done with a 2.0 x 20 mm Sprinter Legend balloon up to 12 atmospheres.  It was felt that a 28 mm stent would be necessary.  A 3.0 x 28 mm Multi-link Vision stent was then attempted to be inserted.  However, this was unable to reach the most distal aspect due to the calcification in the vessel.  A buddy wire was then used with an additional wire being the Luge wire.  Again, this still was unsuccessful to get beyond the most distal aspect of the lesion.  The stent was then removed.  The Sprinter balloon was then reinserted and more aggressive dilatation was made at the site.  The balloon was then removed, and the Multi-link Vision stent was then reinserted and this time, was able to be advanced just proximal to the PDA takeoff.  The buddy wire was then removed.  The stent was dilated x2 at 11 and 12 atmospheres.  A 3.25 x 21 mm noncompliant Sprinter balloon was used for post stent  dilatation with stent taper from 3.2 mm to 3.08 mm.  [facility] angiography confirmed an excellent angiographic result.  The arterial sheath was sutured in place.  The Integrilin was discontinued. Plans are for sheath removal later today.  HEMODYNAMIC DATA:  Central aortic pressure 135/81.  Left ventricular pressure 135/11.  ANGIOGRAPHIC DATA:  There was significant coronary calcification involving the left main, LAD, circumflex and right coronary artery.  The left main had a superior rim of calcification, but did not have significant focal stenosis intraluminally.  The left main bifurcated into an LAD and left circumflex system.  There was  mild luminal irregularity with 20% narrowing in the proximal and mid segment of the LAD before and after the 1st diagonal vessel.  The circumflex vessel had mild luminal irregularity proximally with an eccentric calcified segment.  There was 30% narrowing in the marginal branch and 40% narrowing diffusely in the distal AV groove circumflex.  The right coronary artery was a large caliber dominant vessel.  There was 99% stenosis with thrombus proximal to the acute marginal branch. Following this, there was diffuse 80% stenosis extending proximal to the PDA takeoff.  Following predilatation with ultimate stenting with a 3.0 x 28 mm bare-metal Multi-link Vision stent, post dilated with a 3.25 x 21 mm noncompliant balloon, the entire region was reduced to 0%.  There was brisk TIMI-3 flow.  There was no evidence for dissection.  RAO ventriculography done prior to the intervention showed a LVH with near cavity obliteration.  Ejection fraction greater than 65% without focal segmental wall motion abnormality.  IMPRESSION: 1. Left ventricular hypertrophy with ejection fraction greater than     65% and near mid cavity obliteration. 2. Significant coronary calcification with luminal irregularities of     20% in the LAD 30-40% in the circumflex; and a dominant right     coronary artery with 99% stenosis with thrombus proximal to the     acute margin and diffuse 80% narrowing after the acute margin. 3. Successful percutaneous transluminal coronary angioplasty/stenting     of the right coronary artery with ultimate insertion of a bare-     metal 3.0 x 28 mm Multi-link Vision stent, post dilated with stent     taper from 3.2 to 3.08 mm. 4. Bivalirudin/300 mg Plavix/IC and IV nitroglycerin, and the edges of     the patient being on Integrilin.  The Integrilin was discontinued     at the completion of the procedure.  The platelet count will be     followed closely.  He tolerated the procedure well  and returned to     his room in satisfactory condition.          ______________________________ Nicki Guadalajara, M.D.     TK/MEDQ  D:  05/30/2012  T:  05/31/2012  Job:  [id number]  cc:   Landry Corporal, MD

## 2012-05-31 NOTE — Discharge Summary (Signed)
Physician Discharge Summary  Patient ID: Adam Lopez MRN: [id number] DOB/AGE: 61-Apr-1952 61 y.o.  Admit date: 05/28/2012 Discharge date: 06/07/2012  Admission Diagnoses:  NSTEMI  Discharge Diagnoses:  Principal Problem:  *NSTEMI (non-ST elevated myocardial infarction). 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 Pain was a numbness in the left anterior chest radiation down his left arm  And the left arm was numb as well. This was associated with nausea, diaphoresis and shortness of breath. His EKG by EMS revealed ST depression in II,III and aVF as well as the lateral leads. The patient was given nitroglycerin by EMS with improvement of his chest pain. Here in the emergency room he is currently on IV heparin, IV nitroglycerin at 20 mcgs/min and he is pain-free. EKG improved with less ST depression.  Several weeks ago he also had an episode of chest pain that was not as severe and did not last long. He has a history of tobacco abuse, hypertension but no longer medicated. He also has a family history of coronary disease with his mother and his sister.   Patient has received 325 mg of aspirin, 3--0.4 mg sublingual nitroglycerin and 5 mg of IV Lopressor x3.  He was taken to the cath lab on 9/23 which revealed a 99% lesion in the RCA.  A Vision BMS was inserted.  Troponin peaked at 12.82.  Platelets decreased from 131 to 97.  Cardiac rehab involved.  The patient was seen by Dr. Herbie Baltimore and he feels he is stable for DC home on ASA, effient for one month then ASA, Plavix for one year,  Lipitor, coreg and ACE.   Outpatient considerations:  Change Effient to Plavix one month from discharge.     Consults: None  Significant Diagnostic Studies:  Left heart cath with PCI  DICTATION # [id number], [id number]  Ao: 135/81  LV:135/11  Calcified coronarries  LM: no sig luminal obstruction  LAD: 205 prox and mid  LCX; irregularites prox, 40 diffuse distal  RCA: 99% just [prox to crux with 80% beyond crux  LV: hyperdynamic with near cavity obliteration  PCI RCA with ultimate 3.0 x 28 BMS Vision post dilated to 3.2 to 3.08 taper.  Tolerated well.  Lennette Bihari, MD, [facility]  05/30/2012  11:48 AM   05/28/12, Echo, Study Conclusions  - Left ventricle: The cavity size was normal. Wall thickness was increased in a pattern of mild LVH. There was mild concentric hypertrophy. Systolic function was normal. The estimated ejection fraction was in the range of 50% to 55%. Left ventricular diastolic function parameters were normal. - Aortic valve: Trileaflet; mildly thickened leaflets. There was no stenosis. No regurgitation. - Mitral valve: Mildly calcified annulus. Mildly thickened leaflets . Systolic bowing without prolapse. Trivial regurgitation. - Atrial septum: No defect or patent foramen ovale was identified. - Tricuspid valve: Mild regurgitation.  Lipid Panel     Component Value Date/Time   CHOL 168 05/29/2012 0445   TRIG 122 05/29/2012 0445   HDL 55 05/29/2012 0445   CHOLHDL 3.1 05/29/2012 0445   VLDL 24 05/29/2012 0445   LDLCALC 89 05/29/2012 0445   CBC    Component Value Date/Time   WBC 4.7 05/31/2012 0448   RBC 3.52* 05/31/2012 0448  HGB 12.9* 05/31/2012 0448   HCT 37.3* 05/31/2012 0448   PLT 106* 05/31/2012 0448   MCV 106.0* 05/31/2012 0448   MCH 36.6* 05/31/2012 0448   MCHC 34.6 05/31/2012 0448   RDW 12.9 05/31/2012 0448   LYMPHSABS 0.4* 05/30/2012 0600   MONOABS 0.4 05/30/2012 0600   EOSABS 0.1 05/30/2012 0600   BASOSABS 0.0 05/30/2012 0600    BMET    Component Value Date/Time   NA 134* 05/31/2012 0448   K 3.8 05/31/2012 0448   CL 99 05/31/2012 0448   CO2 22 05/31/2012  0448   GLUCOSE 68* 05/31/2012 0448   BUN 13 05/31/2012 0448   CREATININE 1.33 05/31/2012 0448   CALCIUM 9.0 05/31/2012 0448   GFRNONAA 56* 05/31/2012 0448   GFRAA 65* 05/31/2012 0448   Treatments: PCI to RCA.  ASA, Effient,  Coreg, ACE, lipitor  Discharge Exam: Blood pressure 152/82, pulse 77, temperature 98.1 F (36.7 C), temperature source Oral, resp. rate 19, height 5\' 11"  (1.803 m), weight 63.504 kg (140 lb), SpO2 98.00%.   Disposition: 01-Home or Self Care      Discharge Orders    Future Orders Please Complete By Expires   Amb Referral to Cardiac Rehabilitation      Comments:   Referring to [facility] Phase 2   Diet - low sodium heart healthy      Increase activity slowly      Discharge instructions      Comments:   1.  No driving or lifting more than a gallon of            milk for three days. 2.  Stop smoking.       Medication List     As of 06/07/2012 10:49 AM    STOP taking these medications         BC FAST PAIN RELIEF PO      TAKE these medications         aspirin 81 MG EC tablet   Take 1 tablet (81 mg total) by mouth every 6 (six) hours as needed for pain.      atorvastatin 20 MG tablet   Commonly known as: LIPITOR   Take 1 tablet (20 mg total) by mouth daily at 6 PM.      carvedilol 6.25 MG tablet   Commonly known as: COREG   Take 1 tablet (6.25 mg total) by mouth 2 (two) times daily with a meal.      lisinopril 5 MG tablet   Commonly known as: PRINIVIL,ZESTRIL   Take 1 tablet (5 mg total) by mouth daily.      nitroGLYCERIN 0.4 MG SL tablet   Commonly known as: NITROSTAT   Place 1 tablet (0.4 mg total) under the tongue every 5 (five) minutes as needed for chest pain.      polyethylene glycol packet   Commonly known as: MIRALAX / GLYCOLAX   Take 17 g by mouth daily as needed. constipation      prasugrel 10 MG Tabs   Commonly known as: EFFIENT   Take 1 tablet (10 mg total) by mouth daily.        Follow-up Information    Follow up with  HAGER, BRYAN, PA. (Our office will call you with the appointment date and time. )    Contact information:   [address] [place] [address] [phone]          Signed: Wilburt Finlay 06/07/2012, 10:49 AM  I saw & examined the patient along with Mr. Leron Croak on the day of discharge & agree with his summary as noted above.  Marykay Lex, M.D., M.S. [facility] [address] [place], [place]  [address]  [phone] Pager # [phone]  06/07/2012 3:24 PM

## 2012-05-31 NOTE — Progress Notes (Signed)
61 y/o man with no prior PMH reported, admitted 05/29/12 with NSTEMI.  Treated upstream with IV Heparin, NTG gtt with Integrelin.  Taken to cardiac catheterization on 9/24 - culprit lesion was dRCA-PDA. PCI with BMS 2.0 mm x 28 mm.  Subjective: He feels fine, better today.  No CP or SOB getting up to bathroom. Has not yet ambulated the halls.  Objective: Vital signs in last 24 hours: Temp:  [97.6 F (36.4 C)-98.5 F (36.9 C)] 98 F (36.7 C) (09/24 0605) Pulse Rate:  [78-87] 80  (09/23 1957) Resp:  [16-22] 16  (09/24 0605) BP: (137-170)/(78-94) 155/85 mmHg (09/24 0605) SpO2:  [94 %-96 %] 94 % (09/24 0605) Weight change:  Last BM Date: 05/29/12 Intake/Output from previous day: -648 09/23 0701 - 09/24 0700 In: 2373.5 [P.O.:120; I.V.:2253.5] Out: 1125 [Urine:1125] Intake/Output this shift:   PE: General:alert and oriented, pleasant affect Heart:S1S2 RRR Lungs:occ end exp Wheezes, no rales; mostly CTAB after cough Abd:+ BS, soft non tender Ext:no edema, bilateral femoral bruit; R groin access site with mild bruising & persistent bruit, just mild & expected tenderness.  Neuro: CN II-XII grossly intact.  Lab Results:  Basename 05/31/12 0448 05/30/12 0600  WBC 4.7 2.8*  HGB 12.9* 13.9  HCT 37.3* 39.8  PLT 106* 97*   BMET  Basename 05/31/12 0448 05/30/12 0600  NA 134* 135  K 3.8 4.2  CL 99 100  CO2 22 24  GLUCOSE 68* 84  BUN 13 15  CREATININE 1.33 1.32  CALCIUM 9.0 9.4    Basename 05/28/12 2357 05/28/12 1830  TROPONINI 5.36* 12.82*   Lab Results  Component Value Date   CHOL 168 05/29/2012   HDL 55 05/29/2012   LDLCALC 89 05/29/2012   TRIG 122 05/29/2012   CHOLHDL 3.1 05/29/2012   Lab Results  Component Value Date   HGBA1C 5.0 05/28/2012     Lab Results  Component Value Date   TSH 2.018 05/28/2012    Hepatic Function Panel No results found for this basename: PROT,ALBUMIN,AST,ALT,ALKPHOS,BILITOT,BILIDIR,IBILI in the last 72 hours  Basename 05/29/12 0445  CHOL  168   No results found for this basename: PROTIME in the last 72 hours  ECG: NSR, LVH with repolarization abnormality  Studies/Results: Cardiac Cath: Calcified vessels.  *LM: no sig luminal obstruction; * LAD: 20% prox and mid; * LCX; irregularites prox, 40 diffuse distal; * RCA: 99% just prox to crux with 80% beyond crux in PDA (Culprit) -- PCI RCA with ultimate 3.0 x 28 BMS Vision post dilated to 3.2 to 3.08 taper LV: hyperdynamic with near cavity obliteration   Medications: I have reviewed the patient's current medications.   Marland Kitchen aspirin EC  81 mg Oral Daily  . aspirin  325 mg Oral Daily  . atorvastatin  20 mg Oral q1800  . clopidogrel      . clopidogrel  75 mg Oral Q breakfast  . docusate sodium  100 mg Oral BID  . fentaNYL      . lidocaine      . lisinopril  5 mg Oral Daily  . midazolam      . midazolam      . nicotine  14 mg Transdermal Daily  . nitroGLYCERIN      .  carvedilol  3.125 mg Oral BID WC   Assessment/Plan: Principal Problem:  *NSTEMI (non-ST elevated myocardial infarction). 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 EF preserved & no CHF or arrythmias  Will need DAPT on d/c -- with use 1 month of ASA/Prasugrel then convert to Clopidogrel to continue year of DAPT per PCI-CURE trial; CM consult to assist with 1st month of Prasugrel.  On statin - would d/c on Lipitor 40mg   On BB - have increased Coreg to 6.25mg  bid, along with ACE-I with adequate BP control.  Thrombocytopenia & leukopenia - platelet level & WBC rebounding;  Will need to check CBC by end of week (Thurs-Fri)  DISPO:  [facility] consult to walk in halls & provide Phase 1 consult; will also order Phase 2 consult.    Pending ambulation without difficulty - angina of groin access site concerns -- anticipate d/c home today with plan  to ROV with me @ [facility] in ~2 weeks.    Will get ABIs/LE A dopplers as OP to evaluate claudication  Tobacco cessation -- recommend continued use of Nicoderm patch as he has been doing fine while in-patient.  I counseled him & he is agreeable to try.  Marykay Lex, M.D., M.S. [facility] [address] [place], [place]  [address]  [phone] Pager # [phone]  05/31/2012 8:02 AM

## 2012-05-31 NOTE — Progress Notes (Signed)
Pt walked independently 740 ft. No problems. Ed completed. Pt thinking of quitting smoking. Sts he hasn't made up his mind yet. Provided resources and encouraged him to make a decision while here in hospital. Requests his name be sent to [facility].  [phone] Ethelda Chick CES, ACSM

## 2012-07-15 ENCOUNTER — Ambulatory Visit (HOSPITAL_COMMUNITY)
Admission: RE | Admit: 2012-07-15 | Discharge: 2012-07-15 | Disposition: A | Discharge status: Home / Self Care | Payer: BC Managed Care – PPO | Source: Ambulatory Visit | Attending: Cardiovascular Disease | Admitting: Cardiovascular Disease

## 2012-07-15 DIAGNOSIS — I252 Old myocardial infarction: Secondary | ICD-10-CM | POA: Insufficient documentation

## 2012-07-15 DIAGNOSIS — I10 Essential (primary) hypertension: Secondary | ICD-10-CM | POA: Insufficient documentation

## 2012-07-15 DIAGNOSIS — I739 Peripheral vascular disease, unspecified: Secondary | ICD-10-CM | POA: Insufficient documentation

## 2012-07-15 DIAGNOSIS — F17200 Nicotine dependence, unspecified, uncomplicated: Secondary | ICD-10-CM | POA: Insufficient documentation

## 2012-07-15 DIAGNOSIS — E119 Type 2 diabetes mellitus without complications: Secondary | ICD-10-CM | POA: Insufficient documentation

## 2012-07-15 LAB — COMPREHENSIVE METABOLIC PANEL
BUN: 11 mg/dL (ref 6–23)
CO2: 25 mEq/L (ref 19–32)
Calcium: 9.9 mg/dL (ref 8.4–10.5)
GFR calc Af Amer: 60 mL/min — ABNORMAL LOW (ref >90)
GFR calc non Af Amer: 52 mL/min — ABNORMAL LOW (ref >90)
Glucose, Bld: 89 mg/dL (ref 70–99)
Total Protein: 8.2 g/dL (ref 6.0–8.3)

## 2012-07-15 LAB — CBC
HCT: 40.5 % (ref 39.0–52.0)
Hemoglobin: 14.3 g/dL (ref 13.0–17.0)
MCH: 36.1 pg — ABNORMAL HIGH (ref 26.0–34.0)
MCV: 102.3 fL — ABNORMAL HIGH (ref 78.0–100.0)
RBC: 3.96 MIL/uL — ABNORMAL LOW (ref 4.22–5.81)

## 2012-07-15 LAB — LIPID PANEL
Cholesterol: 172 mg/dL (ref 0–200)
HDL: 67 mg/dL (ref >39)
Total CHOL/HDL Ratio: 2.6 RATIO

## 2013-02-01 ENCOUNTER — Telehealth: Payer: Self-pay | Admitting: Cardiovascular Disease

## 2013-02-01 NOTE — Telephone Encounter (Signed)
Would like to have some samples of Effient 10mg  please!

## 2013-02-01 NOTE — Telephone Encounter (Signed)
Returned call. Pt informed samples at front desk for pick up.  Pt verbalized understanding and agreed w/ plan.

## 2013-03-17 ENCOUNTER — Telehealth: Payer: Self-pay | Admitting: Cardiovascular Disease

## 2013-03-17 NOTE — Telephone Encounter (Signed)
Adam Lopez wants to know if we have any samples Effient 10mg  .. Please Call.Marland Kitchen

## 2013-03-20 MED ORDER — PRASUGREL HCL 10 MG PO TABS: 10.0000 mg | ORAL_TABLET | Freq: Every day | ORAL | Status: DC

## 2013-03-20 NOTE — Telephone Encounter (Signed)
Samples left at front desk for pt pick up.  Lot: [id number]  Exp: 12/2013.  Left message that samples at front desk.

## 2013-04-19 ENCOUNTER — Telehealth: Payer: Self-pay | Admitting: Cardiovascular Disease

## 2013-04-19 NOTE — Telephone Encounter (Signed)
Pt was calling regarding samples of Effient 10 mg.

## 2013-04-20 MED ORDER — PRASUGREL HCL 10 MG PO TABS: 10.0000 mg | ORAL_TABLET | Freq: Every day | ORAL | Status: DC

## 2013-04-20 NOTE — Telephone Encounter (Signed)
Samples left at front desk for pt pick up.  Lot: [id number] D Exp: 04/2014.  Returned call and pt informed samples left at front desk.  Pt verbalized understanding and agreed w/ plan.

## 2013-05-11 ENCOUNTER — Encounter (HOSPITAL_COMMUNITY): Payer: Self-pay | Admitting: Emergency Medicine

## 2013-05-11 ENCOUNTER — Telehealth: Payer: Self-pay | Admitting: Cardiovascular Disease

## 2013-05-11 ENCOUNTER — Emergency Department (HOSPITAL_COMMUNITY)
Admission: EM | Admit: 2013-05-11 | Discharge: 2013-05-11 | Disposition: A | Discharge status: Home / Self Care | Payer: BC Managed Care – PPO | Attending: Emergency Medicine | Admitting: Emergency Medicine

## 2013-05-11 DIAGNOSIS — F17200 Nicotine dependence, unspecified, uncomplicated: Secondary | ICD-10-CM | POA: Insufficient documentation

## 2013-05-11 DIAGNOSIS — I252 Old myocardial infarction: Secondary | ICD-10-CM | POA: Insufficient documentation

## 2013-05-11 DIAGNOSIS — Z79899 Other long term (current) drug therapy: Secondary | ICD-10-CM | POA: Insufficient documentation

## 2013-05-11 DIAGNOSIS — Z862 Personal history of diseases of the blood and blood-forming organs and certain disorders involving the immune mechanism: Secondary | ICD-10-CM | POA: Insufficient documentation

## 2013-05-11 DIAGNOSIS — I10 Essential (primary) hypertension: Secondary | ICD-10-CM | POA: Insufficient documentation

## 2013-05-11 LAB — CBC
HCT: 40.7 % (ref 39.0–52.0)
Hemoglobin: 14 g/dL (ref 13.0–17.0)
MCHC: 34.4 g/dL (ref 30.0–36.0)
MCV: 96.9 fL (ref 78.0–100.0)
RDW: 12.4 % (ref 11.5–15.5)
WBC: 3.2 10*3/uL — ABNORMAL LOW (ref 4.0–10.5)

## 2013-05-11 LAB — BASIC METABOLIC PANEL
BUN: 15 mg/dL (ref 6–23)
Chloride: 95 mEq/L — ABNORMAL LOW (ref 96–112)
Creatinine, Ser: 1.44 mg/dL — ABNORMAL HIGH (ref 0.50–1.35)
GFR calc Af Amer: 59 mL/min — ABNORMAL LOW (ref >90)
Glucose, Bld: 87 mg/dL (ref 70–99)
Potassium: 4.8 mEq/L (ref 3.5–5.1)

## 2013-05-11 NOTE — Telephone Encounter (Signed)
Returned call.  Pt informed message received.  Informed he will need to schedule appt w/ Dr. Tresa Endo for f/u for advice on meds.  Pt stated he was told to see Dr. Tresa Endo before Thursday b/c he's getting two teeth extracted.  Pt informed he will need to be seen.  Pt stated he doesn't have the money to come in and the person he talked to earlier said he had to sign something if he came in.  Pt informed I talked w/ him earlier and told him that he would need to talk w/ the financial counselor r/t payment arrangements when he came in, but he was not expected to pay anything.  Pt verbalized understanding and agreed w/ plan.  Appt scheduled for Tuesday 9.9.14 at 11:15am w/ Dr. Tresa Endo for ER f/u and clearance for tooth extraction.

## 2013-05-11 NOTE — Telephone Encounter (Signed)
Pt was calling regarding his ER trip today for high BP, he stated that the ER told him to tell Dr. Tresa Endo to call the ER about the findings.

## 2013-05-11 NOTE — ED Provider Notes (Signed)
CSN: [id number]     Arrival date & time 05/11/13  1017 History   First MD Initiated Contact with Patient 05/11/13 1132     Chief Complaint  Patient presents with  . Hypertension   (Consider location/radiation/quality/duration/timing/severity/associated sxs/prior Treatment) The history is provided by the patient. No language interpreter was used.  Adam Lopez is a 62 y/o M with PMHx of HTN, NSTEMI in September 2013 with coronary stent placement in September 2013 presenting to the ED with an episode of elevated blood pressure. Patient reported that he has been monitoring his blood pressure closely due to getting his teeth extracted this coming Thursday. Patient reported that he was recently stopped on his blood thinner, Effient, for 7 days due to the surgery that is coming up-patient reported that he stopped his blood thinner medications this past Friday. Patient reported that his blood pressure is normally 155-170's/75-80's. Patient reported that he got his blood pressure checked by a medic and the medic read the blood pressure to be 200/100 - the medic recommended patient to be reassessed while in the ED. patient reported that this was one episode of elevated blood pressure. Patient denied chest pain, shortness of breath, difficulty breathing, headache, dizziness, numbness, tingling, sudden loss of vision, blurred vision, neck pain, back pain, fall, head injury.    Past Medical History  Diagnosis Date  . Hypertension   . NSTEMI (non-ST elevated myocardial infarction). 05/28/12 05/28/2012  . HTN (hypertension) 05/28/2012  . Tobacco abuse 05/28/2012  . Thrombocytopenia 05/29/2012   Past Surgical History  Procedure Laterality Date  . Hernia repair    . Coronary stent placement     Family History  Problem Relation Age of Onset  . Coronary artery disease Mother   . Coronary artery disease Sister    History  Substance Use Topics  . Smoking status: Current Every Day Smoker -- 1.00 packs/day  for 45 years    Types: Cigarettes  . Smokeless tobacco: Never Used  . Alcohol Use: 8.4 oz/week    14 Cans of beer per week    Review of Systems  Constitutional: Negative for fever and chills.  HENT: Negative for neck pain and neck stiffness.   Eyes: Negative for pain and visual disturbance.  Respiratory: Negative for chest tightness and shortness of breath.   Cardiovascular: Negative for chest pain.       Elevated blood pressure  Gastrointestinal: Negative for nausea, vomiting and abdominal pain.  Genitourinary: Negative for dysuria, decreased urine volume and difficulty urinating.  Musculoskeletal: Negative for back pain.  Neurological: Negative for dizziness, weakness, light-headedness, numbness and headaches.  All other systems reviewed and are negative.    Allergies  Codeine  Home Medications   Current Outpatient Rx  Name  Route  Sig  Dispense  Refill  . amoxicillin (AMOXIL) 500 MG capsule   Oral   Take 500 mg by mouth 2 (two) times daily. Started on 05-04-13. For 7 days. 1 dose left to complete therapy         . atorvastatin (LIPITOR) 20 MG tablet   Oral   Take 1 tablet (20 mg total) by mouth daily at 6 PM.   30 tablet   5   . carvedilol (COREG) 12.5 MG tablet   Oral   Take 12.5 mg by mouth 2 (two) times daily with a meal.         . lisinopril (PRINIVIL,ZESTRIL) 5 MG tablet   Oral   Take 10 mg by mouth daily.         Marland Kitchen  nitroGLYCERIN (NITROSTAT) 0.4 MG SL tablet   Sublingual   Place 1 tablet (0.4 mg total) under the tongue every 5 (five) minutes as needed for chest pain.   25 tablet   3   . polyethylene glycol (MIRALAX / GLYCOLAX) packet   Oral   Take 17 g by mouth daily as needed. constipation         . prasugrel (EFFIENT) 10 MG TABS tablet   Oral   Take 1 tablet (10 mg total) by mouth daily.   28 tablet   0    BP 182/92  Pulse 70  Temp(Src) 98.6 F (37 C) (Oral)  Resp 17  SpO2 100% Physical Exam  Nursing note and vitals  reviewed. Constitutional: He is oriented to person, place, and time. He appears well-developed and well-nourished. No distress.  HENT:  Head: Normocephalic and atraumatic.  Eyes: Conjunctivae and EOM are normal. Pupils are equal, round, and reactive to light. Right eye exhibits no discharge. Left eye exhibits no discharge.  Negative nystagmus  Neck: Normal range of motion. Neck supple.  Cardiovascular: Normal rate, regular rhythm and normal heart sounds.  Exam reveals no friction rub.   No murmur heard. Pulses:      Radial pulses are 2+ on the right side, and 2+ on the left side.       Dorsalis pedis pulses are 2+ on the right side, and 2+ on the left side.  Pulmonary/Chest: Effort normal and breath sounds normal. No respiratory distress. He has no wheezes. He has no rales.  Musculoskeletal: Normal range of motion.  Neurological: He is alert and oriented to person, place, and time. No cranial nerve deficit. He exhibits normal muscle tone. Coordination normal.  Cranial nerves III through XII grossly intact Strength 5+/5+ upper and lower extremities bilaterally with resistance, equal distribution identified Sensation intact  Skin: Skin is warm and dry. No rash noted. He is not diaphoretic. No erythema.  Psychiatric: He has a normal mood and affect. His behavior is normal. Thought content normal.    ED Course  Procedures (including critical care time)  2:15 PM This provider was at bedside discussing labs and EKG findings with patient and son. Discussed labs in depth, patient and son understood. Discussed plan for discharge. Stressed importance of patient following up with cardiologist regarding high blood pressure, recommended patient to be seen prior to his tooth extraction that is to be performed next Thursday. Patient denied chest pain, shortness of breath, difficulty breathing, symptoms associated with elevated blood pressure.   Date: 05/11/2013  Rate: 63  Rhythm: normal sinus rhythm   QRS Axis: normal  Intervals: normal  ST/T Wave abnormalities: nonspecific ST/T changes  Conduction Disutrbances:none  Narrative Interpretation:   Old EKG Reviewed: unchanged EKG was reviewed and analyzed by this provider and attending physician.  Labs Review Labs Reviewed  CBC - Abnormal; Notable for the following:    WBC 3.2 (*)    RBC 4.20 (*)    Platelets 121 (*)    All other components within normal limits  BASIC METABOLIC PANEL - Abnormal; Notable for the following:    Sodium 131 (*)    Chloride 95 (*)    Creatinine, Ser 1.44 (*)    GFR calc non Af Amer 51 (*)    GFR calc Af Amer 59 (*)    All other components within normal limits  POCT I-STAT TROPONIN I   Imaging Review No results found.  MDM   1. Elevated blood pressure  2. HTN (hypertension)     Patient presenting to emergency department with one episode of elevated blood pressure that occurred today-as per patient blood pressure was 200/100, denied any symptoms. Patient reported he wanted to get checked out. Patient reported that he takes his medication as prescribed, lisinopril and carvedilol for blood pressure. Alert and oriented. Lungs clear to auscultation bilaterally. Heart rate and rhythm normal. Negative neurological deficits noted. Cranial nerves III through XII grossly intact. Strength intact. Sensation intact. Pulses palpable to upper and lower tremors bilaterally. EKG negative ischemic changes identified. Troponins negative elevation. CMP noted mild hyponatremia, 131. Elevation in creatinine to be 1.44, when compared to creatinine level X months ago patient was 1.4 to-discussed with patient to continue to monitor creatinine with primary care provider. Discussed case and lab findings with Dr. Freida Busman, as per physician cleared patient for discharge.  Patient stable, afebrile. Blood pressure was evaluated at least every hour while being within the emergency department. Blood pressure properly controlled while  in the emergency department setting. Patient denied any symptoms. Denied chest pain, shortness of breath, difficulty breathing, headache, dizziness, blurred vision, neurological deficits. Patient discharged. Discharge patient with referral to cardiologist, Dr. Tresa Endo, to be seen reevaluated prior to tooth extraction surgery there is to be performed on Thursday of next week. Discussed with patient to continue to monitor symptoms and to continue to monitor blood pressure. Discussed with patient to continue to take blood pressure medications as prescribed. Discussed with patient to get creatinine levels rechecked with primary care provider by next week. Educated patient on symptoms to watch out for regarding elevated blood pressure side effects. Discussed with patient to continue to monitor symptoms and if symptoms are to worsen or change report back to emergency department-strict return instructions given. Patient agreed to plan of care, understood, all questions answered.     Raymon Mutton, PA-C 05/12/13 1478  Raymon Mutton, PA-C 05/12/13 [phone]

## 2013-05-11 NOTE — Telephone Encounter (Signed)
Pt has not scheduled appt as of yet.  Call to pt and left message to return call when message received.

## 2013-05-11 NOTE — ED Notes (Signed)
Pt. reports elevated blood pressure this morning = 200/100 , denies any pain or discomfort , respirations unlabored , history of hypertension .

## 2013-05-11 NOTE — Telephone Encounter (Signed)
Medic took his BP about 10 minutes ago it was 200.100.Told him to call here asap.

## 2013-05-11 NOTE — Telephone Encounter (Signed)
Pt stated he had a medic check his BP and it was real high.  Stated he told him to call his doctor.  Stated BP was 200/100.  Denied any symptoms.  Offered appt to be seen today and pt declined.  Stated he owes a balance and was told he couldn't be seen w/o paying something.  Pt asked to hold and RN spoke w/ Bjorn Loser in billing.  Stated pt can be seen today and will need to see Pam, financial counselor, to set up payment arrangements.  Pt informed and verbalized understanding.  RN offered first available appt this afternoon and pt asked if there was anything sooner.  Pt informed RN offered first available and advised he go to urgent care or ER if he wants to be seen sooner as he can only be given an appt that is available.  Pt stated he will call back.  Scheduling notified that pt may call back and to set up first available appt today regardless of bad debt.

## 2013-05-11 NOTE — ED Notes (Signed)
Lab at bedside with pt.

## 2013-05-12 NOTE — ED Provider Notes (Signed)
Medical screening examination/treatment/procedure(s) were performed by non-physician practitioner and as supervising physician I was immediately available for consultation/collaboration.   Haydan Wedig B. Bernette Mayers, MD 05/12/13 [phone]

## 2013-05-16 ENCOUNTER — Encounter: Payer: Self-pay | Admitting: Cardiovascular Disease

## 2013-05-16 ENCOUNTER — Ambulatory Visit (INDEPENDENT_AMBULATORY_CARE_PROVIDER_SITE_OTHER): Payer: BC Managed Care – PPO | Admitting: Cardiovascular Disease

## 2013-05-16 VITALS — BP 162/100 | HR 58 | Ht 70.0 in | Wt 139.9 lb

## 2013-05-16 DIAGNOSIS — I2510 Atherosclerotic heart disease of native coronary artery without angina pectoris: Secondary | ICD-10-CM

## 2013-05-16 DIAGNOSIS — I10 Essential (primary) hypertension: Secondary | ICD-10-CM

## 2013-05-16 DIAGNOSIS — R899 Unspecified abnormal finding in specimens from other organs, systems and tissues: Secondary | ICD-10-CM

## 2013-05-16 DIAGNOSIS — R6889 Other general symptoms and signs: Secondary | ICD-10-CM

## 2013-05-16 DIAGNOSIS — I739 Peripheral vascular disease, unspecified: Secondary | ICD-10-CM

## 2013-05-16 DIAGNOSIS — E785 Hyperlipidemia, unspecified: Secondary | ICD-10-CM

## 2013-05-16 MED ORDER — AMLODIPINE BESYLATE 5 MG PO TABS: 5.0000 mg | ORAL_TABLET | Freq: Every day | ORAL | Status: DC

## 2013-05-16 NOTE — Patient Instructions (Addendum)
Your physician has recommended you make the following change in your medication: start Amlodipine 5 mg and aspirin 81 mg. The new prescription has already been sent to your pharmacy.  Your physician recommends that you return for lab work in: 2 weeks fasting.  Your physician recommends that you schedule a follow-up appointment in: 3-4 WEEKS.

## 2013-05-31 LAB — BASIC METABOLIC PANEL
BUN: 17 mg/dL (ref 6–23)
Calcium: 9.5 mg/dL (ref 8.4–10.5)
Creat: 1.36 mg/dL — ABNORMAL HIGH (ref 0.50–1.35)
Glucose, Bld: 83 mg/dL (ref 70–99)
Potassium: 4.6 mEq/L (ref 3.5–5.3)

## 2013-05-31 LAB — LIPID PANEL
Cholesterol: 134 mg/dL (ref 0–200)
HDL: 67 mg/dL (ref >39)
LDL Cholesterol: 48 mg/dL (ref 0–99)
Total CHOL/HDL Ratio: 2 Ratio
Triglycerides: 97 mg/dL (ref <150)
VLDL: 19 mg/dL (ref 0–40)

## 2013-06-01 NOTE — Progress Notes (Signed)
Released into 'mychart'

## 2013-06-05 ENCOUNTER — Encounter: Payer: Self-pay | Admitting: Cardiovascular Disease

## 2013-06-05 DIAGNOSIS — E785 Hyperlipidemia, unspecified: Secondary | ICD-10-CM | POA: Insufficient documentation

## 2013-06-05 DIAGNOSIS — I2510 Atherosclerotic heart disease of native coronary artery without angina pectoris: Secondary | ICD-10-CM | POA: Insufficient documentation

## 2013-06-05 NOTE — Progress Notes (Signed)
Patient ID: Adam Parker Sr., male   DOB: 04/27/1951, 62 y.o.   MRN: [id number]     HPI: Adam Hadden Sr., is a 62 y.o. male who presents to the office for one year cardiology evaluation.  Adam Lopez has a history of hematologic problems including leukopenia and thrombocytopenia who presented to [facility] on 05/30/2012 with a non-ST segment elevation myocardial infarction. Acuet catheterization revealed 99% stenosis with significant thrombus in the right coronary artery. He has significant coronary calcification. He underwent insertion of a 3.0x20 mm bare metal vision stent in the right coronary artery post dilated 3.25 mm. 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 He also has mild/moderate disease in the iliac arteries bilaterally. He does note some left leg claudication his history of tobacco use, hyperlipidemia, and hypertension.  Over the past year, he denies recent chest pain.  He apparently stopped taking his effient 2 weeks ago. He denies recent chest pain. He has been recently checking his blood pressure. He states he went to [place] and his blood pressure was 190 systolic approximately 1 week ago. He was supposed to have his teeth pulled but because of his hypertensive episodes this was postponed. He apparently went to an EMS clinic in [place] and his blood pressure it was 200/100 but elderly decreased to 180/90. He now presents for evaluation.   Past Medical History  Diagnosis Date  . Hypertension   . NSTEMI (non-ST elevated myocardial infarction). 05/28/12 05/28/2012  . HTN (hypertension) 05/28/2012  . Tobacco abuse 05/28/2012  . Thrombocytopenia 05/29/2012    Past Surgical History  Procedure Laterality Date  . Hernia repair    . Coronary stent placement      Allergies  Allergen Reactions  . Codeine Itching and Rash    Current Outpatient Prescriptions  Medication Sig Dispense Refill  .  atorvastatin (LIPITOR) 20 MG tablet Take 1 tablet (20 mg total) by mouth daily at 6 PM.  30 tablet  5  . carvedilol (COREG) 12.5 MG tablet Take 12.5 mg by mouth 2 (two) times daily with a meal.      . lisinopril (PRINIVIL,ZESTRIL) 5 MG tablet Take 10 mg by mouth daily.      . nitroGLYCERIN (NITROSTAT) 0.4 MG SL tablet Place 1 tablet (0.4 mg total) under the tongue every 5 (five) minutes as needed for chest pain.  25 tablet  3  . prasugrel (EFFIENT) 10 MG TABS tablet Take 1 tablet (10 mg total) by mouth daily.  28 tablet  0  . psyllium (METAMUCIL) 58.6 % powder Take 1 packet by mouth daily.      Marland Kitchen amLODipine (NORVASC) 5 MG tablet Take 1 tablet (5 mg total) by mouth daily.  30 tablet  3   No current facility-administered medications for this visit.    History   Social History  . Marital Status: Married    Spouse Name: N/A    Number of Children: N/A  . Years of Education: N/A   Occupational History  . Not on file.   Social History Main Topics  . Smoking status: Former Smoker -- 1.00 packs/day for 45 years    Types: Cigarettes    Quit date: 07/08/2012  . Smokeless tobacco: Never Used  . Alcohol Use: 8.4 oz/week    14 Cans of beer per week  . Drug Use: No  . Sexual Activity: Not on file   Other Topics Concern  .  Not on file   Social History Narrative  . No narrative on file    Family History  Problem Relation Age of Onset  . Coronary artery disease Mother   . Coronary artery disease Sister    Social history married has 2 children one grandchild. He does try to exercise. There is a remote tobacco history but he quit many years ago. He does drink occasional alcohol.   ROS is negative for fevers, chills or night sweats.  He denies visual symptoms. He denies wheezing. He denies chest pain. He denies tachycardia palpitations. There is no presyncope or syncope. He denies change in bowel or bladder habits. He does note some mild claudication but this has not progressed in his left  leg. There is no edema.  Other system review is negative.  PE BP 162/100  Pulse 58  Ht 5\' 10"  (1.778 m)  Wt 139 lb 14.4 oz (63.458 kg)  BMI 20.07 kg/m2  General: Alert, oriented, no distress.  Skin: normal turgor, no rashes HEENT: Normocephalic, atraumatic. Pupils round and reactive; sclera anicteric;no lid lag.  Nose without nasal septal hypertrophy Mouth/Parynx benign; Mallinpatti scale 3 Neck: No JVD, no carotid briuts Lungs: clear to ausculatation and percussion; no wheezing or rales Heart: RRR, s1 s2 normal faint 1/6 systolic murmur at the Abdomen: soft, nontender; no hepatosplenomehaly, BS+; abdominal aorta nontender and not dilated by palpation. Pulses:  slightly diminished in his leg Extremities: no clubbing cyanosis or edema, Homan's sign negative  Neurologic: grossly nonfocal  ECG: Sinus rhythm at 58 beats per minute. A prominent Q wave in V1 and V 2 with R wave in V2.  Nonspecific ST changes.  LABS:  BMET    Component Value Date/Time   NA 129* 05/31/2013 0924   K 4.6 05/31/2013 0924   CL 94* 05/31/2013 0924   CO2 26 05/31/2013 0924   GLUCOSE 83 05/31/2013 0924   BUN 17 05/31/2013 0924   CREATININE 1.36* 05/31/2013 0924   CREATININE 1.44* 05/11/2013 1234   CALCIUM 9.5 05/31/2013 0924   GFRNONAA 51* 05/11/2013 1234   GFRAA 59* 05/11/2013 1234     Hepatic Function Panel     Component Value Date/Time   PROT 8.2 07/15/2012 0910   ALBUMIN 4.3 07/15/2012 0910   AST 27 07/15/2012 0910   ALT 19 07/15/2012 0910   ALKPHOS 103 07/15/2012 0910   BILITOT 0.6 07/15/2012 0910     CBC    Component Value Date/Time   WBC 3.2* 05/11/2013 1234   RBC 4.20* 05/11/2013 1234   HGB 14.0 05/11/2013 1234   HCT 40.7 05/11/2013 1234   PLT 121* 05/11/2013 1234   MCV 96.9 05/11/2013 1234   MCH 33.3 05/11/2013 1234   MCHC 34.4 05/11/2013 1234   RDW 12.4 05/11/2013 1234   LYMPHSABS 0.4* 05/30/2012 0600   MONOABS 0.4 05/30/2012 0600   EOSABS 0.1 05/30/2012 0600   BASOSABS 0.0 05/30/2012 0600     BNP No  results found for this basename: probnp    Lipid Panel     Component Value Date/Time   CHOL 134 05/31/2013 0924   TRIG 97 05/31/2013 0924   HDL 67 05/31/2013 0924   CHOLHDL 2.0 05/31/2013 0924   VLDL 19 05/31/2013 0924   LDLCALC 48 05/31/2013 0924     RADIOLOGY: No results found.    ASSESSMENT AND PLAN: Impression is that Mr. Intriago is a 62 year old gentleman who has a history of prior non-ST segment elevation myocardial infarction which time he underwent successful  intervention to subtotal right coronary artery with significant thrombus burden initially. He does have documented abdominal aortic aneurysm. He does have peripheral vascular disease with stable claudication symptoms. He now is off at the end in his is now is on Ambien therapy I am recommending that he resume at least a baby aspirin 81 mg daily. He is hypertensive and on starting him on amlodipine 5 mg daily for optimal blood pressure control per I did review his most recent laboratory. Serum creatinine was 1.44 per was mildly neutropenic with a white blood count of 3.2. Platelets were 121. He did quit tobacco use. I am recommending laboratory be rechecked in several weeks. I'll see him in 3-4 weeks for followup neurological evaluation.     Lennette Bihari, MD, [facility]  06/05/2013 8:31 PM

## 2013-06-06 ENCOUNTER — Encounter: Payer: Self-pay | Admitting: Cardiovascular Disease

## 2013-06-20 ENCOUNTER — Ambulatory Visit (INDEPENDENT_AMBULATORY_CARE_PROVIDER_SITE_OTHER): Payer: BC Managed Care – PPO | Admitting: Cardiovascular Disease

## 2013-06-20 VITALS — BP 130/80 | HR 61 | Ht 70.0 in | Wt 142.0 lb

## 2013-06-20 DIAGNOSIS — I10 Essential (primary) hypertension: Secondary | ICD-10-CM

## 2013-06-20 DIAGNOSIS — E785 Hyperlipidemia, unspecified: Secondary | ICD-10-CM

## 2013-06-20 DIAGNOSIS — I214 Non-ST elevation (NSTEMI) myocardial infarction: Secondary | ICD-10-CM

## 2013-06-20 DIAGNOSIS — I2510 Atherosclerotic heart disease of native coronary artery without angina pectoris: Secondary | ICD-10-CM

## 2013-06-20 NOTE — Patient Instructions (Signed)
Your physician recommends that you schedule a follow-up appointment in: 4 MONTHS. No changes were made today. Dr. Tresa Endo has cleared you to have your dental surgery. You will need to hold your asa FOR 5 DAYS.

## 2013-06-21 ENCOUNTER — Encounter: Payer: Self-pay | Admitting: Cardiovascular Disease

## 2013-06-21 NOTE — Progress Notes (Signed)
Patient ID: Adam Mathes Sr., male   DOB: 1951-02-20, 62 y.o.   MRN: [id number]      HPI: Adam Benavides Sr., is a 62 y.o. male who presents to the office for cardiology evaluation. He was recently seen several weeks ago which time he had stage II hypertension and had not been taking several medications.  Adam Lopez has a history of hematologic problems including leukopenia and thrombocytopenia who presented to [facility] on 05/30/2012 with a non-ST segment elevation myocardial infarction. Acute catheterization revealed 99% stenosis with significant thrombus in the right coronary artery. He has significant coronary calcification. He underwent insertion of a 3.0x20 mm bare metal vision stent in the right coronary artery post dilated 3.25 mm. 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 He also has mild/moderate disease in the iliac arteries bilaterally. He does note some left leg claudication his history of tobacco use, hyperlipidemia, and hypertension.  Over the past year, he denies recent chest pain.  He  stopped taking his effient 24weeks ago. He denies recent chest pain. Prior to his last office visit, he had been checking his blood pressure and had noted that this had significantly increased up to 200 systolic. He was evaluated at EMS clinic in [place] and his blood pressure was 200/100 and subsequently decreased to 180/90. I saw him on 06/01/2013, I started him on amlodipine 5 mg. His creatinine previously was 1.44 I recommended followup laboratory and an office visit several weeks he tells me he is in need for a dental procedure.   Past Medical History  Diagnosis Date  . Hypertension   . NSTEMI (non-ST elevated myocardial infarction). 05/28/12 05/28/2012  . HTN (hypertension) 05/28/2012  . Tobacco abuse 05/28/2012  . Thrombocytopenia 05/29/2012    Past Surgical History  Procedure Laterality Date  . Hernia  repair    . Coronary stent placement      Allergies  Allergen Reactions  . Codeine Itching and Rash    Current Outpatient Prescriptions  Medication Sig Dispense Refill  . amLODipine (NORVASC) 5 MG tablet Take 1 tablet (5 mg total) by mouth daily.  30 tablet  3  . aspirin 81 MG tablet Take 81 mg by mouth daily.      Marland Kitchen atorvastatin (LIPITOR) 20 MG tablet Take 1 tablet (20 mg total) by mouth daily at 6 PM.  30 tablet  5  . carvedilol (COREG) 12.5 MG tablet Take 12.5 mg by mouth 2 (two) times daily with a meal.      . lisinopril (PRINIVIL,ZESTRIL) 5 MG tablet Take 10 mg by mouth daily.      . psyllium (METAMUCIL) 58.6 % powder Take 1 packet by mouth daily.      . nitroGLYCERIN (NITROSTAT) 0.4 MG SL tablet Place 1 tablet (0.4 mg total) under the tongue every 5 (five) minutes as needed for chest pain.  25 tablet  3   No current facility-administered medications for this visit.    History   Social History  . Marital Status: Married    Spouse Name: N/A    Number of Children: N/A  . Years of Education: N/A   Occupational History  . Not on file.   Social History Main Topics  . Smoking status: Former Smoker -- 1.00 packs/day for 45 years    Types: Cigarettes    Quit date: 07/08/2012  . Smokeless tobacco: Never Used  . Alcohol Use: 8.4  oz/week    14 Cans of beer per week  . Drug Use: No  . Sexual Activity: Not on file   Other Topics Concern  . Not on file   Social History Narrative  . No narrative on file    Family History  Problem Relation Age of Onset  . Coronary artery disease Mother   . Coronary artery disease Sister    Social history married has 2 children one grandchild. He does try to exercise. There is a remote tobacco history but he quit many years ago. He does drink occasional alcohol.   ROS is negative for fevers, chills or night sweats.  He denies visual symptoms. He denies wheezing. He denies congestion. He denies chest pain. He denies tachycardia  palpitations. There is no presyncope or syncope. He denies change in bowel or bladder habits. He denied bleeding. He denied urinary symptoms. He does note some mild claudication but this has not progressed in his left leg. There is no edema.  Other comprehensive 12 point system review is negative.  PE BP 130/80  Pulse 61  Ht 5\' 10"  (1.778 m)  Wt 142 lb (64.411 kg)  BMI 20.37 kg/m2  General: Alert, oriented, no distress.  Skin: normal turgor, no rashes HEENT: Normocephalic, atraumatic. Pupils round and reactive; sclera anicteric;no lid lag.  Nose without nasal septal hypertrophy Mouth/Parynx benign; Mallinpatti scale 3 Neck: No JVD, no carotid briuts Lungs: clear to ausculatation and percussion; no wheezing or rales Heart: RRR, s1 s2 normal faint 1/6 systolic murmur at the Abdomen: soft, nontender; no hepatosplenomehaly, BS+; abdominal aorta nontender and not dilated by palpation. Pulses:  slightly diminished in his leg Extremities: no clubbing cyanosis or edema, Homan's sign negative  Neurologic: grossly nonfocal  ECG: Sinus rhythm at 61 beats per minute. A prominent Q wave in V1 and V 2 with R wave in V2.  Nonspecific ST changes.  LABS:  BMET    Component Value Date/Time   NA 129* 05/31/2013 0924   K 4.6 05/31/2013 0924   CL 94* 05/31/2013 0924   CO2 26 05/31/2013 0924   GLUCOSE 83 05/31/2013 0924   BUN 17 05/31/2013 0924   CREATININE 1.36* 05/31/2013 0924   CREATININE 1.44* 05/11/2013 1234   CALCIUM 9.5 05/31/2013 0924   GFRNONAA 51* 05/11/2013 1234   GFRAA 59* 05/11/2013 1234     Hepatic Function Panel     Component Value Date/Time   PROT 8.2 07/15/2012 0910   ALBUMIN 4.3 07/15/2012 0910   AST 27 07/15/2012 0910   ALT 19 07/15/2012 0910   ALKPHOS 103 07/15/2012 0910   BILITOT 0.6 07/15/2012 0910     CBC    Component Value Date/Time   WBC 3.2* 05/11/2013 1234   RBC 4.20* 05/11/2013 1234   HGB 14.0 05/11/2013 1234   HCT 40.7 05/11/2013 1234   PLT 121* 05/11/2013 1234   MCV 96.9  05/11/2013 1234   MCH 33.3 05/11/2013 1234   MCHC 34.4 05/11/2013 1234   RDW 12.4 05/11/2013 1234   LYMPHSABS 0.4* 05/30/2012 0600   MONOABS 0.4 05/30/2012 0600   EOSABS 0.1 05/30/2012 0600   BASOSABS 0.0 05/30/2012 0600     BNP No results found for this basename: probnp    Lipid Panel     Component Value Date/Time   CHOL 134 05/31/2013 0924   TRIG 97 05/31/2013 0924   HDL 67 05/31/2013 0924   CHOLHDL 2.0 05/31/2013 0924   VLDL 19 05/31/2013 0924   LDLCALC 48 05/31/2013  1610     RADIOLOGY: No results found.    ASSESSMENT AND PLAN:   Mr. Hoard is a 62 year old gentleman who is 1 year s/p  non-ST segment elevation myocardial infarction at which time he underwent successful intervention to subtotal right coronary artery with significant thrombus burden initially. He does have documented abdominal aortic aneurysm. He does have peripheral vascular disease with stable claudication symptoms. He had been on Effient for one year but stopped taking this last month. When I last saw him I recommended  that he resume at least a baby aspirin 81 mg daily. He was started on amlodipine 5 mg and his blood pressure today is improved to 122/78 when taken by me. His ECG remained stable. He is not having anginal symptoms. I have given him clearance to undergo his dental procedure. I suspect he will need to hold his baby aspirin for 5 days prior to the procedure but he is to contact his dentist with regards to this. As long as he remained stable, I will see him in 4 months for followup cardiology evaluation.    Lennette Bihari, MD, [facility]  06/21/2013 6:37 PM

## 2013-06-22 ENCOUNTER — Encounter: Payer: Self-pay | Admitting: Cardiovascular Disease

## 2013-06-26 ENCOUNTER — Telehealth: Payer: Self-pay | Admitting: Cardiovascular Disease

## 2013-06-26 MED ORDER — LISINOPRIL 10 MG PO TABS: 15.0000 mg | ORAL_TABLET | Freq: Every day | ORAL | Status: DC

## 2013-06-26 NOTE — Telephone Encounter (Signed)
Returned call and informed pt per instructions by MD/PA.  Pt verbalized understanding and agreed w/ plan.  Rx sent to [facility] pharmacy per pt request.

## 2013-06-26 NOTE — Telephone Encounter (Signed)
In office last week was given new BP Med Amlodipine 5mg   Has had foot swelling and rash. Said pharmacist suggested he skip a day  Skipped yesterday and swelling has gone away and rash improving.  Wants to see if Dr Tresa Endo will change to a different BP med,  Please call

## 2013-06-26 NOTE — Telephone Encounter (Signed)
Ok to dc amlodipine; try increasing lisinopril to 15 mg from 10 mg

## 2013-06-26 NOTE — Telephone Encounter (Signed)
Forwarded to Amber

## 2013-06-26 NOTE — Telephone Encounter (Signed)
Message printed on placed on Dr. Landry Dyke cart to review and advise.

## 2013-07-13 ENCOUNTER — Other Ambulatory Visit: Payer: Self-pay

## 2013-07-31 ENCOUNTER — Telehealth: Payer: Self-pay | Admitting: Cardiovascular Disease

## 2013-07-31 NOTE — Telephone Encounter (Signed)
Had a nosebleed this morning and just recently again after got out of shower.  Is this anything to be concerned about.  He is on blood thinner. Please call

## 2013-07-31 NOTE — Telephone Encounter (Signed)
Returned call and pt verified x 2.  Pt c/o nosebleed this morning.  Used cold washcloth and it stopped.  Pt stated he had another one after he took a shower.  RN asked pt if he blew his nose before or after the shower and pt stated he did.  Pt informed the steam from the shower likely opened the blood vessels in his nose and when he blew it, it caused the bleeding b/c he is on a blood thinner.  Pt stated he looked in his nose w/ a flashlight and saw some blood and wiped it.  Stated that's when it started bleeding again.  Pt advised to avoid picking at clots or blowing nose for a few hours after nosebleed stopped to prevent more bleeding.  Pt also advised to apply a thin layer of petroleum jelly on septum of nose to help it stay moist w/ dry heat (gas) and increase water intake.  Pt also advised to pinch nose, lean forward and apply cool compress to nose if bleed returns.  Do NOT swallow blood or hold head back.  Pt verbalized understanding and agreed w/ plan.  Pt will call back if needed.

## 2013-08-04 ENCOUNTER — Telehealth: Payer: Self-pay | Admitting: Cardiology

## 2013-08-04 NOTE — Telephone Encounter (Signed)
Pt called complaining of a nosebleed. He says he is on ASA 81 mg but not Effient, review of his records support this. I told him he could hold his ASA X 48 hrs and suggested a humidifier in his bedroom.   Corine Shelter PA-C 08/04/2013 10:25 AM

## 2013-08-25 ENCOUNTER — Telehealth: Payer: Self-pay | Admitting: Cardiovascular Disease

## 2013-08-25 MED ORDER — NITROGLYCERIN 0.4 MG SL SUBL: 0.4000 mg | SUBLINGUAL_TABLET | SUBLINGUAL | Status: DC | PRN

## 2013-08-25 MED ORDER — CARVEDILOL 12.5 MG PO TABS: 12.5000 mg | ORAL_TABLET | Freq: Two times a day (BID) | ORAL | Status: DC

## 2013-08-25 MED ORDER — ATORVASTATIN CALCIUM 40 MG PO TABS: 40.0000 mg | ORAL_TABLET | Freq: Every day | ORAL | Status: DC

## 2013-08-25 MED ORDER — LISINOPRIL 10 MG PO TABS: 15.0000 mg | ORAL_TABLET | Freq: Every day | ORAL | Status: DC

## 2013-08-25 NOTE — Telephone Encounter (Signed)
Will need [facility] prescription [facility] on his Carvediol he needs this one refill  , because he is no longer getting his medications from his mail order .Marland Kitchen Also need new prescriptions for all medications to go to [facility] in [place] .Marland [phone].. Thanks

## 2013-08-25 NOTE — Telephone Encounter (Signed)
Returned call and pt verified x 2.  Pt stated pharmacy changing from mail order to [facility] in [place].  Needs all Rxs transferred.  RN reviewed meds w/ pt and Refill(s) sent to pharmacy.  Pt verbalized understanding and agreed w/ plan.

## 2013-09-14 ENCOUNTER — Encounter (HOSPITAL_COMMUNITY): Payer: Self-pay | Admitting: *Deleted

## 2013-09-19 ENCOUNTER — Other Ambulatory Visit (HOSPITAL_COMMUNITY): Payer: Self-pay | Admitting: Cardiovascular Disease

## 2013-09-19 DIAGNOSIS — I714 Abdominal aortic aneurysm, without rupture, unspecified: Secondary | ICD-10-CM

## 2013-09-19 DIAGNOSIS — I739 Peripheral vascular disease, unspecified: Secondary | ICD-10-CM

## 2013-09-26 ENCOUNTER — Ambulatory Visit (HOSPITAL_COMMUNITY)
Admission: RE | Admit: 2013-09-26 | Discharge: 2013-09-26 | Disposition: A | Discharge status: Home / Self Care | Payer: BC Managed Care – PPO | Source: Ambulatory Visit | Attending: Internal Medicine | Admitting: Internal Medicine

## 2013-09-26 DIAGNOSIS — I70219 Atherosclerosis of native arteries of extremities with intermittent claudication, unspecified extremity: Secondary | ICD-10-CM | POA: Insufficient documentation

## 2013-09-26 DIAGNOSIS — I714 Abdominal aortic aneurysm, without rupture, unspecified: Secondary | ICD-10-CM | POA: Insufficient documentation

## 2013-09-26 DIAGNOSIS — I739 Peripheral vascular disease, unspecified: Secondary | ICD-10-CM

## 2013-09-26 NOTE — Progress Notes (Signed)
Arterial Duplex Lower Ext. Completed. Lyden Redner, BS, RDMS, RVT  

## 2013-10-03 ENCOUNTER — Encounter (HOSPITAL_COMMUNITY): Payer: BC Managed Care – PPO

## 2013-10-18 ENCOUNTER — Ambulatory Visit: Payer: BC Managed Care – PPO | Admitting: Cardiovascular Disease

## 2013-10-23 ENCOUNTER — Encounter: Payer: Self-pay | Admitting: *Deleted

## 2014-03-12 ENCOUNTER — Telehealth (HOSPITAL_COMMUNITY): Payer: Self-pay | Admitting: *Deleted

## 2014-07-20 ENCOUNTER — Telehealth: Payer: Self-pay | Admitting: Cardiovascular Disease

## 2014-07-20 MED ORDER — CARVEDILOL 12.5 MG PO TABS: 12.5000 mg | ORAL_TABLET | Freq: Two times a day (BID) | ORAL | Status: DC

## 2014-07-20 MED ORDER — LISINOPRIL 10 MG PO TABS: 15.0000 mg | ORAL_TABLET | Freq: Every day | ORAL | Status: DC

## 2014-07-20 MED ORDER — ATORVASTATIN CALCIUM 40 MG PO TABS: 40.0000 mg | ORAL_TABLET | Freq: Every day | ORAL | Status: DC

## 2014-07-20 NOTE — Telephone Encounter (Signed)
Pt called in stating the he will need new prescriptions for his Metoprolol, Lisinopril, and Atorvastatin sent to the [facility] in [place]. Please call  Thanks

## 2014-07-20 NOTE — Telephone Encounter (Signed)
Spoke with patient to clarify medications, as patient is not on Metoprolol, patient states it is Carvedilol . Rx refill sent to patient pharmacy

## 2014-08-16 ENCOUNTER — Encounter (HOSPITAL_COMMUNITY): Payer: Self-pay | Admitting: Cardiovascular Disease

## 2014-08-19 ENCOUNTER — Other Ambulatory Visit: Payer: Self-pay | Admitting: Cardiovascular Disease

## 2014-08-20 NOTE — Telephone Encounter (Signed)
Rx refill sent to patient pharmacy   

## 2014-09-13 ENCOUNTER — Telehealth: Payer: Self-pay | Admitting: Cardiovascular Disease

## 2014-09-13 NOTE — Telephone Encounter (Signed)
Left message for pt to call.

## 2014-09-13 NOTE — Telephone Encounter (Signed)
Spoke with pt, they have changed [place] to tier 2 instead of tier 1. Therefore the patient will have a higher co-pay and deductible to see dr Claiborne Billings. They are going to send him a list of who is considered a tier 1 doctor. They patient wanted to let dr Claiborne Billings know

## 2014-09-13 NOTE — Telephone Encounter (Signed)
Pt called in stating that due to his insurance change he will not be able to see Dr. Claiborne Billings anymore. He says that his insurance put Dr. Claiborne Billings in another "classification" and they will not be paying for it anymore. Please call  Thanks

## 2014-09-13 NOTE — Telephone Encounter (Signed)
Dr. Juliann Mule

## 2014-09-14 ENCOUNTER — Other Ambulatory Visit: Payer: Self-pay | Admitting: Cardiovascular Disease

## 2014-09-14 NOTE — Telephone Encounter (Signed)
Rx refill sent to patient pharmacy . Patient changing cardiologist

## 2014-10-12 ENCOUNTER — Telehealth: Payer: Self-pay | Admitting: Cardiovascular Disease

## 2014-10-12 MED ORDER — CARVEDILOL 12.5 MG PO TABS: 12.5000 mg | ORAL_TABLET | Freq: Two times a day (BID) | ORAL | Status: DC

## 2014-10-12 MED ORDER — LISINOPRIL 10 MG PO TABS: 15.0000 mg | ORAL_TABLET | Freq: Every day | ORAL | Status: DC

## 2014-10-12 NOTE — Telephone Encounter (Signed)
Pt has not had recent appt, spoke w/ him. He cites needing to change cardiologists due to travel/transportation, also states has been having trouble finding providers on his insurance's preferred list. He anticipates this information from his insurer in the mail. Informed him I would send 30 day rx to pharmacy.

## 2014-10-12 NOTE — Telephone Encounter (Signed)
°  1. Which medications need to be refilled? Lisinopril and Carvedilol  2. Which pharmacy is medication to be sent to? [facility](he switched pharmacies)  3. Do they need a 30 day or 90 day supply? 30  4. Would they like a call back once the medication has been sent to the pharmacy? no

## 2014-11-12 ENCOUNTER — Other Ambulatory Visit: Payer: Self-pay | Admitting: Cardiovascular Disease

## 2015-01-08 ENCOUNTER — Other Ambulatory Visit: Payer: Self-pay | Admitting: Cardiovascular Disease

## 2015-01-08 ENCOUNTER — Ambulatory Visit (INDEPENDENT_AMBULATORY_CARE_PROVIDER_SITE_OTHER): Payer: BLUE CROSS/BLUE SHIELD | Admitting: Cardiovascular Disease

## 2015-01-08 ENCOUNTER — Encounter: Payer: Self-pay | Admitting: Cardiovascular Disease

## 2015-01-08 VITALS — BP 160/90 | HR 65 | Ht 70.5 in | Wt 136.9 lb

## 2015-01-08 DIAGNOSIS — I10 Essential (primary) hypertension: Secondary | ICD-10-CM | POA: Diagnosis not present

## 2015-01-08 DIAGNOSIS — I2510 Atherosclerotic heart disease of native coronary artery without angina pectoris: Secondary | ICD-10-CM

## 2015-01-08 DIAGNOSIS — I739 Peripheral vascular disease, unspecified: Secondary | ICD-10-CM | POA: Diagnosis not present

## 2015-01-08 DIAGNOSIS — I714 Abdominal aortic aneurysm, without rupture, unspecified: Secondary | ICD-10-CM

## 2015-01-08 DIAGNOSIS — E785 Hyperlipidemia, unspecified: Secondary | ICD-10-CM | POA: Diagnosis not present

## 2015-01-08 MED ORDER — LISINOPRIL 20 MG PO TABS: 20.0000 mg | ORAL_TABLET | Freq: Every day | ORAL | Status: DC

## 2015-01-08 MED ORDER — CARVEDILOL 12.5 MG PO TABS: 12.5000 mg | ORAL_TABLET | Freq: Two times a day (BID) | ORAL | Status: DC

## 2015-01-08 MED ORDER — ATORVASTATIN CALCIUM 40 MG PO TABS: 40.0000 mg | ORAL_TABLET | Freq: Every day | ORAL | Status: DC

## 2015-01-08 NOTE — Telephone Encounter (Signed)
E-sent medication atorvastatin#30 and carvedilol #60 x 6 to pharmacy

## 2015-01-08 NOTE — Telephone Encounter (Signed)
°  1. Which medications need to be refilled? Atorvastatin and Carvedilol  2. Which pharmacy is medication to be sent to? [phone]  3. Do they need a 30 day or 90 day supply? 30 and refills  4. Would they like a call back once the medication has been sent to the pharmacy? no

## 2015-01-08 NOTE — Patient Instructions (Signed)
Your physician has recommended you make the following change in your medication: increase the lisinopril to 20 mg. A new prescription has been sent to your pharmacy.  Your physician recommends that you return for lab work fasting.  Your physician has requested that you have an abdominal aorta duplex. During this test, an ultrasound is used to evaluate the aorta. Allow 30 minutes for this exam. Do not eat after midnight the day before and avoid carbonated beverages  Your physician has requested that you have a lower  extremity arterial duplex. This test is an ultrasound of the arteries in the legs or arms. It looks at arterial blood flow in the legs and arms. Allow one hour for Lower and Upper Arterial scans. There are no restrictions or special instructions  Your physician recommends that you schedule a follow-up appointment in:2 months.

## 2015-01-09 ENCOUNTER — Encounter: Payer: Self-pay | Admitting: Cardiovascular Disease

## 2015-01-09 NOTE — Progress Notes (Signed)
Patient ID: Adam Tidwell Sr., male   DOB: 03/14/1951, 64 y.o.   MRN: [id number]     HPI: Adam Bushong Sr. is a 64 y.o. male who presents to the office for a 20 month cardiology evaluation.   Adam Lopez has a history of hematologic problems including leukopenia and thrombocytopenia and presented to [facility] on 05/30/2012 with a NSTEMI. Acute catheterization revealed 99% stenosis with significant thrombus in the RCA. He had significant coronary calcification. He underwent insertion of a 3.0x20 mm bare metal vision stent in the RCA post dilated 3.25 mm. 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 He also has mild/moderate disease in the iliac arteries bilaterally. He does note some left leg claudication his history of tobacco use, hyperlipidemia, and hypertension.  When I last saw him in October 2014 he was having issues with labile hypertension.   As I last saw him, he is now retired as a Engineer, building services.  He has been taking lisinopril 15 mg daily, carvedilol 12.5 mg twice a day for blood pressure control.  He has been on atorvastatin 40 mg for hyperlipidemia.  He no longer is on dual antiplatelet therapy, but continues with aspirin 81 mg.  He admits to occasional cramps in his legs and feet.  He is a documented abdominal aortic aneurysm.  He denies recent chest pain.  He denies PND or apnea.  He has not had recent blood work.  Past Medical History  Diagnosis Date  . Hypertension   . NSTEMI (non-ST elevated myocardial infarction). 05/28/12 05/28/2012  . HTN (hypertension) 05/28/2012  . Tobacco abuse 05/28/2012  . Thrombocytopenia 05/29/2012    Past Surgical History  Procedure Laterality Date  . Hernia repair    . Coronary stent placement    . Left heart catheterization with coronary angiogram N/A 05/30/2012    Procedure: LEFT HEART CATHETERIZATION WITH CORONARY ANGIOGRAM;  Surgeon: Troy Sine, MD;   Location[facility] CATH LAB;  Service: Cardiovascular;  Laterality: N/A;    Allergies  Allergen Reactions  . Codeine Itching and Rash    Current Outpatient Prescriptions  Medication Sig Dispense Refill  . aspirin 81 MG tablet Take 81 mg by mouth daily.    . psyllium (METAMUCIL) 58.6 % powder Take 1 packet by mouth daily.    Marland Kitchen atorvastatin (LIPITOR) 40 MG tablet Take 1 tablet (40 mg total) by mouth daily at 6 PM. 30 tablet 6  . carvedilol (COREG) 12.5 MG tablet Take 1 tablet (12.5 mg total) by mouth 2 (two) times daily with a meal. 60 tablet 6  . lisinopril (PRINIVIL,ZESTRIL) 20 MG tablet Take 1 tablet (20 mg total) by mouth daily. 30 tablet 6  . nitroGLYCERIN (NITROSTAT) 0.4 MG SL tablet Place 1 tablet (0.4 mg total) under the tongue every 5 (five) minutes as needed for chest pain (up to 3 tabs in 15 mins). 25 tablet 2   No current facility-administered medications for this visit.    History   Social History  . Marital Status: Married    Spouse Name: N/A  . Number of Children: N/A  . Years of Education: N/A   Occupational History  . Not on file.   Social History Main Topics  . Smoking status: Former Smoker -- 1.00 packs/day for 45 years    Types: Cigarettes    Quit date: 07/08/2012  . Smokeless tobacco: Never Used  . Alcohol Use: 8.4 oz/week  14 Cans of beer per week  . Drug Use: No  . Sexual Activity: Not on file   Other Topics Concern  . Not on file   Social History Narrative    Family History  Problem Relation Age of Onset  . Coronary artery disease Mother   . Coronary artery disease Sister    Social history married has 2 children one grandchild. He does try to exercise. There is a remote tobacco history but he quit many years ago. He does drink occasional alcohol.  ROS General: Negative; No fevers, chills, or night sweats;  HEENT: Negative; No changes in vision or hearing, sinus congestion, difficulty swallowing Pulmonary: Negative; No cough, wheezing,  shortness of breath, hemoptysis Cardiovascular: See history of present illness GI: Negative; No nausea, vomiting, diarrhea, or abdominal pain GU: Negative; No dysuria, hematuria, or difficulty voiding Musculoskeletal: Leg cramps with possible claudication, left leg. Hematologic/Oncology: Negative; no easy bruising, bleeding Endocrine: Negative; no heat/cold intolerance; no diabetes Neuro: Negative; no changes in balance, headaches Skin: Negative; No rashes or skin lesions Psychiatric: Negative; No behavioral problems, depression Sleep: Negative; No snoring, daytime sleepiness, hypersomnolence, bruxism, restless legs, hypnogognic hallucinations, no cataplexy Other comprehensive 14 point system review is negative.   PE BP 160/90 mmHg  Pulse 65  Ht 5' 10.5" (1.791 m)  Wt 136 lb 14.4 oz (62.097 kg)  BMI 19.36 kg/m2   Wt Readings from Last 3 Encounters:  01/08/15 136 lb 14.4 oz (62.097 kg)  06/20/13 142 lb (64.411 kg)  05/16/13 139 lb 14.4 oz (63.458 kg)   General: Alert, oriented, no distress.  Skin: normal turgor, no rashes HEENT: Normocephalic, atraumatic. Pupils round and reactive; sclera anicteric;no lid lag.  Nose without nasal septal hypertrophy Mouth/Parynx benign; Mallinpatti scale 3 Neck: No JVD, no carotid bruits Lungs: clear to ausculatation and percussion; no wheezing or rales Chest wall: Nontender to palpation Heart: RRR, s1 s2 normal faint 1/6 systolic murmur; no S3 gallop.  No diastolic murmur.  No rubs thrills or heaves. Abdomen: soft, nontender; no hepatosplenomehaly, BS+; abdominal aorta nontender and not dilated by palpation. Back: No CVA tenderness Pulses:  slightly diminished in his left leg Extremities: no clubbing cyanosis or edema, Homan's sign negative  Neurologic: grossly nonfocal Psychological: Normal affect and mood  ECG (independently read by me): Normal sinus rhythm at 65 bpm.  Possible LVH by voltage.  Scalloping of ST segments inferolaterally.   Not significantly hanged.  October 2014 ECG: Sinus rhythm at 61 beats per minute. A prominent Q wave in V1 and V 2 with R wave in V2.  Nonspecific ST changes.  LABS: BMP Latest Ref Rng 05/31/2013 05/11/2013 07/15/2012  Glucose 70 - 99 mg/dL 83 87 89  BUN 6 - 23 mg/dL 17 15 11   Creatinine 0.50 - 1.35 mg/dL 1.36(H) 1.44(H) 1.42(H)  Sodium 135 - 145 mEq/L 129(L) 131(L) 132(L)  Potassium 3.5 - 5.3 mEq/L 4.6 4.8 4.8  Chloride 96 - 112 mEq/L 94(L) 95(L) 94(L)  CO2 19 - 32 mEq/L 26 26 25   Calcium 8.4 - 10.5 mg/dL 9.5 9.8 9.9   Hepatic Function Latest Ref Rng 07/15/2012 05/28/2012  Total Protein 6.0 - 8.3 g/dL 8.2 7.4  Albumin 3.5 - 5.2 g/dL 4.3 3.7  AST 0 - 37 U/L 27 38(H)  ALT 0 - 53 U/L 19 24  Alk Phosphatase 39 - 117 U/L 103 98  Total Bilirubin 0.3 - 1.2 mg/dL 0.6 0.4   CBC Latest Ref Rng 05/11/2013 07/15/2012 05/31/2012  WBC 4.0 - 10.5  K/uL 3.2(L) 3.4(L) 4.7  Hemoglobin 13.0 - 17.0 g/dL 14.0 14.3 12.9(L)  Hematocrit 39.0 - 52.0 % 40.7 40.5 37.3(L)  Platelets 150 - 400 K/uL 121(L) 139(L) 106(L)   Lab Results  Component Value Date   TSH 2.691 05/31/2013   Lab Results  Component Value Date   HGBA1C 5.0 05/28/2012   RADIOLOGY: No results found.    ASSESSMENT AND PLAN:  Adam Lopez is a 64 year old gentleman who is almost 3 years status post presenting with a NSTEMI and underwent successful intervention to subtotal right coronary artery with significant thrombus burden initially. He does have documented abdominal aortic aneurysm. He does have peripheral vascular disease with  claudication symptoms. He had been on Effient for one year but stopped taking this in 2014 When I last saw him I recommended  that he resume at least a baby aspirin 81 mg daily.  His blood pressure is elevated today on his current medical regimen.  I am recommending titration of his lisinopril from 15 mg to 20 mg.  A complete set of blood work will be obtained in the fasting state.   I am also recommending follow-up  evaluation with duplex imaging for his abdominal aortic aneurysm as well as his arterial duplex of his lower extremities.  I will contact him regarding the results.  He is not having any chest pain or CHF symptoms.  I will see him in 2 months for reevaluation or sooner if problems arise.  Troy Sine, MD, [facility]  01/09/2015 8:43 PM

## 2015-01-14 ENCOUNTER — Telehealth: Payer: Self-pay | Admitting: Cardiovascular Disease

## 2015-01-14 NOTE — Telephone Encounter (Signed)
Pt called in stating that he has switched pharmacies. He now goes to [facility] in [place]. The number there is [phone]  Thanks

## 2015-01-14 NOTE — Telephone Encounter (Signed)
Pt states just update on pharmacy, does not need refills - informed him I would update his preferred pharmacy in chart.

## 2015-01-29 ENCOUNTER — Telehealth: Payer: Self-pay | Admitting: Cardiovascular Disease

## 2015-01-29 ENCOUNTER — Inpatient Hospital Stay (HOSPITAL_COMMUNITY): Admission: RE | Admit: 2015-01-29 | Payer: BLUE CROSS/BLUE SHIELD | Source: Ambulatory Visit

## 2015-01-29 ENCOUNTER — Encounter (HOSPITAL_COMMUNITY): Payer: BLUE CROSS/BLUE SHIELD

## 2015-01-29 LAB — CBC
HEMATOCRIT: 43.3 % (ref 39.0–52.0)
Hemoglobin: 14.8 g/dL (ref 13.0–17.0)
MCH: 34.3 pg — AB (ref 26.0–34.0)
MCHC: 34.2 g/dL (ref 30.0–36.0)
MCV: 100.2 fL — AB (ref 78.0–100.0)
MPV: 10 fL (ref 8.6–12.4)
Platelets: 148 10*3/uL — ABNORMAL LOW (ref 150–400)
RBC: 4.32 MIL/uL (ref 4.22–5.81)
RDW: 13.8 % (ref 11.5–15.5)
WBC: 5 10*3/uL (ref 4.0–10.5)

## 2015-01-29 NOTE — Telephone Encounter (Signed)
We did not need this encounter,pt just need to reschedule his appt.

## 2015-01-30 LAB — COMPREHENSIVE METABOLIC PANEL
ALT: 17 U/L (ref 0–53)
AST: 27 U/L (ref 0–37)
Albumin: 4 g/dL (ref 3.5–5.2)
Alkaline Phosphatase: 95 U/L (ref 39–117)
BUN: 15 mg/dL (ref 6–23)
CHLORIDE: 97 meq/L (ref 96–112)
CO2: 25 meq/L (ref 19–32)
Calcium: 9.7 mg/dL (ref 8.4–10.5)
Creat: 1.42 mg/dL — ABNORMAL HIGH (ref 0.50–1.35)
Glucose, Bld: 82 mg/dL (ref 70–99)
Potassium: 5 mEq/L (ref 3.5–5.3)
Sodium: 132 mEq/L — ABNORMAL LOW (ref 135–145)
Total Bilirubin: 0.7 mg/dL (ref 0.2–1.2)
Total Protein: 7.9 g/dL (ref 6.0–8.3)

## 2015-01-30 LAB — LIPID PANEL
Cholesterol: 169 mg/dL (ref 0–200)
HDL: 69 mg/dL (ref >=40)
LDL CALC: 84 mg/dL (ref 0–99)
Total CHOL/HDL Ratio: 2.4 Ratio
Triglycerides: 81 mg/dL (ref <150)
VLDL: 16 mg/dL (ref 0–40)

## 2015-01-30 LAB — TSH: TSH: 3.611 u[IU]/mL (ref 0.350–4.500)

## 2015-02-13 ENCOUNTER — Telehealth: Payer: Self-pay | Admitting: Cardiovascular Disease

## 2015-02-13 MED ORDER — LISINOPRIL 20 MG PO TABS: 20.0000 mg | ORAL_TABLET | Freq: Every day | ORAL | Status: DC

## 2015-02-13 NOTE — Telephone Encounter (Signed)
°  1. Which medications need to be refilled? Lisinopril '20mg'$   2. Which pharmacy is medication to be sent to?[facility] in [place]   3. Do they need a 30 day or 90 day supply? 90  4. Would they like a call back once the medication has been sent to the pharmacy? Yes

## 2015-02-13 NOTE — Telephone Encounter (Signed)
Rx(s) sent to pharmacy electronically. Patient notified. 

## 2015-02-14 ENCOUNTER — Other Ambulatory Visit: Payer: Self-pay | Admitting: *Deleted

## 2015-02-14 MED ORDER — LISINOPRIL 20 MG PO TABS: 20.0000 mg | ORAL_TABLET | Freq: Every day | ORAL | Status: DC

## 2015-02-14 NOTE — Telephone Encounter (Signed)
Lisinopril refilled electronically to [facility].

## 2015-02-28 ENCOUNTER — Ambulatory Visit (HOSPITAL_BASED_OUTPATIENT_CLINIC_OR_DEPARTMENT_OTHER)
Admission: RE | Admit: 2015-02-28 | Discharge: 2015-02-28 | Disposition: A | Discharge status: Home / Self Care | Payer: BLUE CROSS/BLUE SHIELD | Source: Ambulatory Visit | Attending: Cardiovascular Disease | Admitting: Cardiovascular Disease

## 2015-02-28 ENCOUNTER — Ambulatory Visit (HOSPITAL_COMMUNITY)
Admission: RE | Admit: 2015-02-28 | Discharge: 2015-02-28 | Disposition: A | Discharge status: Home / Self Care | Payer: BLUE CROSS/BLUE SHIELD | Source: Ambulatory Visit | Attending: Cardiovascular Disease | Admitting: Cardiovascular Disease

## 2015-02-28 DIAGNOSIS — I714 Abdominal aortic aneurysm, without rupture, unspecified: Secondary | ICD-10-CM

## 2015-02-28 DIAGNOSIS — I739 Peripheral vascular disease, unspecified: Secondary | ICD-10-CM | POA: Diagnosis not present

## 2015-02-28 DIAGNOSIS — R739 Hyperglycemia, unspecified: Secondary | ICD-10-CM | POA: Diagnosis not present

## 2015-03-07 ENCOUNTER — Telehealth: Payer: Self-pay | Admitting: Cardiovascular Disease

## 2015-03-07 DIAGNOSIS — I714 Abdominal aortic aneurysm, without rupture, unspecified: Secondary | ICD-10-CM

## 2015-03-07 NOTE — Telephone Encounter (Signed)
Pt had ultrasound last Thursday,he would like the results please.

## 2015-03-07 NOTE — Telephone Encounter (Signed)
Returned call to patient abd doppler results given.Advised needs to have repeated again in 6 months.

## 2015-03-18 ENCOUNTER — Ambulatory Visit (INDEPENDENT_AMBULATORY_CARE_PROVIDER_SITE_OTHER): Payer: BLUE CROSS/BLUE SHIELD | Admitting: Cardiovascular Disease

## 2015-03-18 ENCOUNTER — Other Ambulatory Visit: Payer: Self-pay | Admitting: *Deleted

## 2015-03-18 VITALS — BP 142/90 | HR 62 | Ht 70.0 in | Wt 137.7 lb

## 2015-03-18 DIAGNOSIS — I25810 Atherosclerosis of coronary artery bypass graft(s) without angina pectoris: Secondary | ICD-10-CM

## 2015-03-18 DIAGNOSIS — E785 Hyperlipidemia, unspecified: Secondary | ICD-10-CM | POA: Diagnosis not present

## 2015-03-18 DIAGNOSIS — I714 Abdominal aortic aneurysm, without rupture, unspecified: Secondary | ICD-10-CM

## 2015-03-18 DIAGNOSIS — I739 Peripheral vascular disease, unspecified: Secondary | ICD-10-CM | POA: Diagnosis not present

## 2015-03-18 DIAGNOSIS — I10 Essential (primary) hypertension: Secondary | ICD-10-CM

## 2015-03-18 MED ORDER — CARVEDILOL 12.5 MG PO TABS: 18.7500 mg | ORAL_TABLET | Freq: Two times a day (BID) | ORAL | Status: DC

## 2015-03-18 NOTE — Patient Instructions (Signed)
Your physician has recommended you make the following change in your medication: the carvedilol has been increased to 1.5 tablets twice a day. ( 18.75 mg)    You have been referred to Dr. Quay Burow for [facility] evaluation.  Your physician recommends that you schedule a follow-up appointment pending Dr. Kennon Holter evaluation.

## 2015-03-18 NOTE — Telephone Encounter (Signed)
Rx(s) sent to pharmacy electronically.  

## 2015-03-19 ENCOUNTER — Encounter: Payer: Self-pay | Admitting: Cardiovascular Disease

## 2015-03-19 DIAGNOSIS — I714 Abdominal aortic aneurysm, without rupture, unspecified: Secondary | ICD-10-CM | POA: Insufficient documentation

## 2015-03-19 NOTE — Progress Notes (Signed)
Patient ID: Adam Debski Sr., male   DOB: 1951/01/28, 64 y.o.   MRN: [id number]    HPI: Adam Lolli Sr. is a 64 y.o. male who presents to the office for a 2 month cardiology evaluation.   Adam Lopez has a history of hematologic problems including leukopenia and thrombocytopenia and presented to [facility] on 05/30/2012 with a NSTEMI. Acute catheterization revealed 99% stenosis with significant thrombus in the RCA. He had significant coronary calcification. He underwent insertion of a 3.0x20 mm bare metal vision stent in the RCA post dilated 3.25 mm. 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 He also has mild/moderate disease in the iliac arteries bilaterally. He does note some left leg claudication his history of tobacco use, hyperlipidemia, and hypertension.  He is now retired as a Engineer, building services.  In the past.  He has had issues with labile hypertension.  He has been taking lisinopril 15 mg daily, carvedilol 12.5 mg twice a day for blood pressure control.  He has been on atorvastatin 40 mg for hyperlipidemia.  He no longer is on dual antiplatelet therapy, but continues with aspirin 81 mg.  He admits to occasional cramps in his legs and feet.  He is a documented abdominal aortic aneurysm.  He denies recent chest pain.  He denies PND or apnea.  He has not had recent blood work.  This.  I last saw him, I scheduled him for follow-up abdominal aortic ultrasound, as well as lower extremity duplex imaging.  He previously was noted to have an abdominal aortic aneurysm which in January 2015 measured 4.4 x 4.2 cm.  On his most recent abdominal ultrasound of 02/28/15, this had increased now to 5.3 x 5.5 cm.  However, on the same day.  he also had lower extremity Doppler studies and on that evaluation.  His fusiform distal abdominal aortic aneurysm measured 5.05.1 cm.  His ABIs had declined from one year previously and now was  0.86 on the left and 0.98 on the right.  He had increased left distal EIA velocity of 38 9 cm/s.  He does have claudication symptoms with these have slightly improved recently compared to several months ago.  He has a history of easy bruisability and for this reason has no longer been on dual and a platelet therapy, but just aspirin alone.  He denies recent chest pain, PND orthopnea.  He presents for follow-up evaluation.  Past Medical History  Diagnosis Date  . Hypertension   . NSTEMI (non-ST elevated myocardial infarction). 05/28/12 05/28/2012  . HTN (hypertension) 05/28/2012  . Tobacco abuse 05/28/2012  . Thrombocytopenia 05/29/2012    Past Surgical History  Procedure Laterality Date  . Hernia repair    . Coronary stent placement    . Left heart catheterization with coronary angiogram N/A 05/30/2012    Procedure: LEFT HEART CATHETERIZATION WITH CORONARY ANGIOGRAM;  Surgeon: Troy Sine, MD;  Location: [facility] CATH LAB;  Service: Cardiovascular;  Laterality: N/A;    Allergies  Allergen Reactions  . Codeine Itching and Rash    Current Outpatient Prescriptions  Medication Sig Dispense Refill  . aspirin 81 MG tablet Take 81 mg by mouth daily.    Marland Kitchen atorvastatin (LIPITOR) 40 MG tablet Take 1 tablet (40 mg total) by mouth daily at 6 PM. 30 tablet 6  . lisinopril (PRINIVIL,ZESTRIL) 20 MG tablet Take 1 tablet (20 mg total) by mouth daily. 90 tablet  1  . psyllium (METAMUCIL) 58.6 % powder Take 1 packet by mouth daily.    . carvedilol (COREG) 12.5 MG tablet Take 1.5 tablets (18.75 mg total) by mouth 2 (two) times daily with a meal. 90 tablet 6  . nitroGLYCERIN (NITROSTAT) 0.4 MG SL tablet Place 1 tablet (0.4 mg total) under the tongue every 5 (five) minutes as needed for chest pain (up to 3 tabs in 15 mins). 25 tablet 2   No current facility-administered medications for this visit.    History   Social History  . Marital Status: Married    Spouse Name: N/A  . Number of Children: N/A  .  Years of Education: N/A   Occupational History  . Not on file.   Social History Main Topics  . Smoking status: Former Smoker -- 1.00 packs/day for 45 years    Types: Cigarettes    Quit date: 07/08/2012  . Smokeless tobacco: Never Used  . Alcohol Use: 8.4 oz/week    14 Cans of beer per week  . Drug Use: No  . Sexual Activity: Not on file   Other Topics Concern  . Not on file   Social History Narrative    Family History  Problem Relation Age of Onset  . Coronary artery disease Mother   . Coronary artery disease Sister    Social history married has 2 children one grandchild. He does try to exercise. There is a remote tobacco history but he quit many years ago. He does drink occasional alcohol.  ROS General: Negative; No fevers, chills, or night sweats;  HEENT: Negative; No changes in vision or hearing, sinus congestion, difficulty swallowing Pulmonary: Negative; No cough, wheezing, shortness of breath, hemoptysis Cardiovascular: See history of present illness GI: Negative; No nausea, vomiting, diarrhea, or abdominal pain GU: Negative; No dysuria, hematuria, or difficulty voiding Musculoskeletal: Leg cramps with possible claudication, left leg. Hematologic/Oncology: Negative; no easy bruising, bleeding Endocrine: Negative; no heat/cold intolerance; no diabetes Neuro: Negative; no changes in balance, headaches Skin: Negative; No rashes or skin lesions Psychiatric: Negative; No behavioral problems, depression Sleep: Negative; No snoring, daytime sleepiness, hypersomnolence, bruxism, restless legs, hypnogognic hallucinations, no cataplexy Other comprehensive 14 point system review is negative.   PE BP 142/90 mmHg  Pulse 62  Ht 5' 10"  (1.778 m)  Wt 137 lb 11.2 oz (62.46 kg)  BMI 19.76 kg/m2   Wt Readings from Last 3 Encounters:  03/18/15 137 lb 11.2 oz (62.46 kg)  01/08/15 136 lb 14.4 oz (62.097 kg)  06/20/13 142 lb (64.411 kg)   General: Alert, oriented, no distress.   Skin: normal turgor, no rashes HEENT: Normocephalic, atraumatic. Pupils round and reactive; sclera anicteric;no lid lag.  Nose without nasal septal hypertrophy Mouth/Parynx benign; Mallinpatti scale 3 Neck: No JVD, no carotid bruits Lungs: clear to ausculatation and percussion; no wheezing or rales Chest wall: Nontender to palpation Heart: RRR, s1 s2 normal faint 1/6 systolic murmur; no S3 gallop.  No diastolic murmur.  No rubs thrills or heaves. Abdomen: soft, nontender; no hepatosplenomehaly, BS+; abdominal aorta nontender and not dilated by palpation. Back: No CVA tenderness Pulses:  slightly diminished in his left leg Extremities: no clubbing cyanosis or edema, Homan's sign negative  Neurologic: grossly nonfocal Psychological: Normal affect and mood  ECG (independently read by me): Sinus rhythm at 62 bpm.  LVH.  QRS complex V1 V2 and prominent Q wave in aVL.  May 2016 ECG (independently read by me): Normal sinus rhythm at 65 bpm.  Possible LVH  by voltage.  Scalloping of ST segments inferolaterally.  Not significantly hanged.  October 2014 ECG: Sinus rhythm at 61 beats per minute. A prominent Q wave in V1 and V 2 with R wave in V2.  Nonspecific ST changes.  LABS: BMP Latest Ref Rng 01/29/2015 05/31/2013 05/11/2013  Glucose 70 - 99 mg/dL 82 83 87  BUN 6 - 23 mg/dL 15 17 15   Creatinine 0.50 - 1.35 mg/dL 1.42(H) 1.36(H) 1.44(H)  Sodium 135 - 145 mEq/L 132(L) 129(L) 131(L)  Potassium 3.5 - 5.3 mEq/L 5.0 4.6 4.8  Chloride 96 - 112 mEq/L 97 94(L) 95(L)  CO2 19 - 32 mEq/L 25 26 26   Calcium 8.4 - 10.5 mg/dL 9.7 9.5 9.8   Hepatic Function Latest Ref Rng 01/29/2015 07/15/2012 05/28/2012  Total Protein 6.0 - 8.3 g/dL 7.9 8.2 7.4  Albumin 3.5 - 5.2 g/dL 4.0 4.3 3.7  AST 0 - 37 U/L 27 27 38(H)  ALT 0 - 53 U/L 17 19 24   Alk Phosphatase 39 - 117 U/L 95 103 98  Total Bilirubin 0.2 - 1.2 mg/dL 0.7 0.6 0.4   CBC Latest Ref Rng 01/29/2015 05/11/2013 07/15/2012  WBC 4.0 - 10.5 K/uL 5.0 3.2(L) 3.4(L)    Hemoglobin 13.0 - 17.0 g/dL 14.8 14.0 14.3  Hematocrit 39.0 - 52.0 % 43.3 40.7 40.5  Platelets 150 - 400 K/uL 148(L) 121(L) 139(L)   Lab Results  Component Value Date   TSH 3.611 01/29/2015   Lab Results  Component Value Date   HGBA1C 5.0 05/28/2012   Lipid Panel     Component Value Date/Time   CHOL 169 01/29/2015 0846   TRIG 81 01/29/2015 0846   HDL 69 01/29/2015 0846   CHOLHDL 2.4 01/29/2015 0846   VLDL 16 01/29/2015 0846   LDLCALC 84 01/29/2015 0846    RADIOLOGY: No results found.    ASSESSMENT AND PLAN: Mr. Ayala is a 64 year old gentleman who is  3 years status post presenting with a NSTEMI and underwent successful intervention to subtotal RCA with significant thrombus burden. He has a documented abdominal aortic aneurysm. He does have peripheral vascular disease with  claudication symptoms. He had been on Effient for one year but stopped taking this in 2014.  He admits to easy bruisability.  When I last saw him, he was hypertensive and I further titrated his lisinopril.  Blood pressure today is improved but still mildly elevated.  I will further titrate his carvedilol to 18.75 mg twice a day.  I also scheduled him for follow-up evaluation of his lower extremity Doppler study as well as abdominal aortic aneurysm evaluation.  These reveal progressive abnormality from when last checked in 2015.  I have recommended a PV evaluation with Dr. Gwenlyn Found.  There is some slight discordance in the abdominal aortic ultrasound, and the aortic measurements obtained on the lower extremity Doppler study.  If his dimensions further increase vascular surgery evaluation may be indicated for his AAA.  I reviewed recent laboratory.  He is on atorvastatin 40 mg for hyperlipidemia.  I will see him in several months following his PV evaluation.  Time spent: 25 minutes  Troy Sine, MD, [facility]  03/19/2015 9:52 PM

## 2015-03-27 ENCOUNTER — Ambulatory Visit (INDEPENDENT_AMBULATORY_CARE_PROVIDER_SITE_OTHER): Payer: BLUE CROSS/BLUE SHIELD | Admitting: Cardiovascular Disease

## 2015-03-27 ENCOUNTER — Telehealth: Payer: Self-pay | Admitting: Cardiovascular Disease

## 2015-03-27 ENCOUNTER — Encounter: Payer: Self-pay | Admitting: Cardiovascular Disease

## 2015-03-27 VITALS — BP 160/96 | HR 68 | Ht 70.0 in | Wt 137.0 lb

## 2015-03-27 DIAGNOSIS — I714 Abdominal aortic aneurysm, without rupture, unspecified: Secondary | ICD-10-CM

## 2015-03-27 DIAGNOSIS — Z01818 Encounter for other preprocedural examination: Secondary | ICD-10-CM

## 2015-03-27 DIAGNOSIS — I2510 Atherosclerotic heart disease of native coronary artery without angina pectoris: Secondary | ICD-10-CM | POA: Diagnosis not present

## 2015-03-27 NOTE — Assessment & Plan Note (Signed)
Adam Lopez was referred to me by Dr. Claiborne Billings for evaluation and treatment of a moderately large infrarenal abdominal aortic aneurysm. This measures 5.5 cm on recent abdominal ultrasound performed 02/28/15. He does have a normal ABI on the right and a mildly depressed ABI on the left with what appears to be a high-frequency signal in the left external iliac artery. His serum creatinine is 1.4 making CTA somewhat problematic. I suspect he will need aortobifem. I am referring him to Dr. Trula Slade for further evaluation.

## 2015-03-27 NOTE — Telephone Encounter (Signed)
Recommended for pre-op clearance.  Attempted to call patient. Phone rings with no answer or VM.

## 2015-03-27 NOTE — Patient Instructions (Signed)
Your physician has requested that you have a lexiscan myoview. For further information please visit [email or web address]. Please follow instruction sheet, as given.  Dr Gwenlyn Found has referred to you Dr Orvan Falconer at [facility] [place].

## 2015-03-27 NOTE — Progress Notes (Signed)
03/27/2015 Adam Luz Sr.   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 Claiborne Billings for peripheral vascular evaluation. He has a history of CAD status post RCA intervention 05/30/12 the setting of a non-STEMI. His other problems include discontinue tobacco abuse at that time, treated hypertension and hyperlipidemia. He refused with saw Dr. Claiborne Billings in the office and  Abdominal ultrasound was reviewed. Apparently his aneurysm has grown to 5.5 cm. His serum creatinine is 1.4. I'm going to refer him to Dr. Trula Slade for further evaluation. I suspect he will need aortobifemoral bypass grafting.   Current Outpatient Prescriptions  Medication Sig Dispense Refill  . aspirin 81 MG tablet Take 81 mg by mouth daily.    Marland Kitchen atorvastatin (LIPITOR) 40 MG tablet Take 1 tablet (40 mg total) by mouth daily at 6 PM. 30 tablet 6  . carvedilol (COREG) 12.5 MG tablet Take 1.5 tablets (18.75 mg total) by mouth 2 (two) times daily with a meal. 90 tablet 6  . lisinopril (PRINIVIL,ZESTRIL) 20 MG tablet Take 1 tablet (20 mg total) by mouth daily. 90 tablet 1  . psyllium (METAMUCIL) 58.6 % powder Take 1 packet by mouth daily.    . nitroGLYCERIN (NITROSTAT) 0.4 MG SL tablet Place 1 tablet (0.4 mg total) under the tongue every 5 (five) minutes as needed for chest pain (up to 3 tabs in 15 mins). 25 tablet 2   No current facility-administered medications for this visit.    Allergies  Allergen Reactions  . Codeine Itching and Rash    History   Social History  . Marital Status: Married    Spouse Name: N/A  . Number of Children: N/A  . Years of Education: N/A   Occupational History  . Not on file.   Social History Main Topics  . Smoking status: Former Smoker -- 1.00 packs/day for 45  years    Types: Cigarettes    Quit date: 07/08/2012  . Smokeless tobacco: Never Used  . Alcohol Use: 8.4 oz/week    14 Cans of beer per week  . Drug Use: No  . Sexual Activity: Not on file   Other Topics Concern  . Not on file   Social History Narrative     Review of Systems: General: negative for chills, fever, night sweats or weight changes.  Cardiovascular: negative for chest pain, dyspnea on exertion, edema, orthopnea, palpitations, paroxysmal nocturnal dyspnea or shortness of breath Dermatological: negative for rash Respiratory: negative for cough or wheezing Urologic: negative for hematuria Abdominal: negative for nausea, vomiting, diarrhea, bright red blood per rectum, melena, or hematemesis Neurologic: negative for visual changes, syncope, or dizziness All other systems reviewed and are otherwise negative except as noted above.    Blood pressure 160/96, pulse 68, height '5\' 10"'$  (1.778 m), weight 137 lb (62.143 kg).  General appearance: alert and no distress Neck: no adenopathy, no carotid bruit, no JVD, supple, symmetrical, trachea midline and thyroid not enlarged, symmetric, no tenderness/mass/nodules Lungs: clear to auscultation bilaterally Heart: regular rate and rhythm, S1, S2 normal, no murmur, click, rub or gallop Extremities: extremities normal, atraumatic, no cyanosis or edema  EKG not performed today  ASSESSMENT AND PLAN:   AAA (abdominal aortic aneurysm) Adam Lopez was referred to me by Dr. Claiborne Billings for evaluation and  treatment of a moderately large infrarenal abdominal aortic aneurysm. This measures 5.5 cm on recent abdominal ultrasound performed 02/28/15. He does have a normal ABI on the right and a mildly depressed ABI on the left with what appears to be a high-frequency signal in the left external iliac artery. His serum creatinine is 1.4 making CTA somewhat problematic. I suspect he will need aortobifem. I am referring him to Dr. Trula Slade for further  evaluation.      Lorretta Harp MD FACP,FACC,FAHA, [facility] 03/27/2015 11:55 AM

## 2015-03-27 NOTE — Telephone Encounter (Signed)
Please call,pt said Dr Gwenlyn Found scheduled a stress test for him.He does not understand why he scheduled it.

## 2015-03-27 NOTE — Telephone Encounter (Signed)
Returning your call. °

## 2015-03-27 NOTE — Telephone Encounter (Signed)
Recommendations given to patient - he voiced understanding.

## 2015-04-03 ENCOUNTER — Telehealth (HOSPITAL_COMMUNITY): Payer: Self-pay

## 2015-04-03 NOTE — Telephone Encounter (Signed)
Encounter complete. 

## 2015-04-05 ENCOUNTER — Ambulatory Visit (HOSPITAL_COMMUNITY)
Admission: RE | Admit: 2015-04-05 | Discharge: 2015-04-05 | Disposition: A | Discharge status: Home / Self Care | Payer: BLUE CROSS/BLUE SHIELD | Source: Ambulatory Visit | Attending: Cardiology | Admitting: Cardiology

## 2015-04-05 DIAGNOSIS — Z9861 Coronary angioplasty status: Secondary | ICD-10-CM | POA: Diagnosis not present

## 2015-04-05 DIAGNOSIS — I2510 Atherosclerotic heart disease of native coronary artery without angina pectoris: Secondary | ICD-10-CM | POA: Insufficient documentation

## 2015-04-05 DIAGNOSIS — I739 Peripheral vascular disease, unspecified: Secondary | ICD-10-CM | POA: Diagnosis not present

## 2015-04-05 DIAGNOSIS — Z01818 Encounter for other preprocedural examination: Secondary | ICD-10-CM | POA: Diagnosis present

## 2015-04-05 DIAGNOSIS — Z87891 Personal history of nicotine dependence: Secondary | ICD-10-CM | POA: Diagnosis not present

## 2015-04-05 DIAGNOSIS — I252 Old myocardial infarction: Secondary | ICD-10-CM | POA: Insufficient documentation

## 2015-04-05 DIAGNOSIS — Z8249 Family history of ischemic heart disease and other diseases of the circulatory system: Secondary | ICD-10-CM | POA: Diagnosis not present

## 2015-04-05 DIAGNOSIS — I10 Essential (primary) hypertension: Secondary | ICD-10-CM | POA: Insufficient documentation

## 2015-04-05 LAB — MYOCARDIAL PERFUSION IMAGING
LV dias vol: 90 mL
LVSYSVOL: 40 mL
NUC STRESS TID: 0.94
Peak HR: 91 {beats}/min
Rest HR: 64 {beats}/min
SDS: 2
SRS: 0
SSS: 2

## 2015-04-05 MED ORDER — REGADENOSON 0.4 MG/5ML IV SOLN
0.4000 mg | Freq: Once | INTRAVENOUS | Status: AC
  Administered 2015-04-05: 0.4 mg via INTRAVENOUS

## 2015-04-05 MED ORDER — TECHNETIUM TC 99M SESTAMIBI GENERIC - CARDIOLITE
10.4000 | Freq: Once | INTRAVENOUS | Status: AC | PRN
  Administered 2015-04-05: 10 via INTRAVENOUS

## 2015-04-05 MED ORDER — TECHNETIUM TC 99M SESTAMIBI GENERIC - CARDIOLITE
30.2000 | Freq: Once | INTRAVENOUS | Status: AC | PRN
  Administered 2015-04-05: 30.2 via INTRAVENOUS

## 2015-04-26 ENCOUNTER — Encounter: Payer: Self-pay | Admitting: Surgery

## 2015-04-29 ENCOUNTER — Other Ambulatory Visit: Payer: Self-pay | Admitting: Surgery

## 2015-04-29 ENCOUNTER — Ambulatory Visit (INDEPENDENT_AMBULATORY_CARE_PROVIDER_SITE_OTHER): Payer: BLUE CROSS/BLUE SHIELD | Admitting: Surgery

## 2015-04-29 ENCOUNTER — Encounter: Payer: Self-pay | Admitting: Surgery

## 2015-04-29 VITALS — BP 125/79 | HR 66 | Temp 97.8°F | Ht 70.0 in | Wt 139.4 lb

## 2015-04-29 DIAGNOSIS — I714 Abdominal aortic aneurysm, without rupture, unspecified: Secondary | ICD-10-CM

## 2015-04-29 LAB — CREATININE, SERUM: Creat: 1.37 mg/dL — ABNORMAL HIGH (ref 0.70–1.25)

## 2015-04-29 NOTE — Progress Notes (Signed)
Patient name: Adam Grand Sr. MRN: [id number] DOB: [birth year] Sex: male   Referred by: Dr. Gwenlyn Found  Reason for referral:  Chief Complaint  Patient presents with  . New Evaluation    Patient referred by Dr. Gwenlyn Found for 5.3 x 5.5 AAA.     HISTORY OF PRESENT ILLNESS: This is a 64 year old gentleman who is referred to me for evaluation of an abdominal aortic aneurysm.  This has been followed by Dr. Claiborne Billings.  His most recent ultrasound revealed a 5.3 x 5.5 cm abdominal aortic aneurysm.  The patient denies abdominal pain or back pain.  The patient does endorse claudication symptoms.  His duplex revealed a normal ABI on the right and mildly decreased on the left with a questionable velocity elevation in the left external iliac artery.  The patient has stage III renal insufficiency.  His creatinine was 1.4.  The patient suffers from hypercholesterolemia which is managed with a statin.  He is treated for hypertension with an ACE inhibitor.  He is a former smoker.  He is status post myocardial infarction in 2013 with subsequent stent placement.  Past Medical History  Diagnosis Date  . Hypertension   . NSTEMI (non-ST elevated myocardial infarction). 05/28/12 05/28/2012  . HTN (hypertension) 05/28/2012  . Tobacco abuse 05/28/2012  . Thrombocytopenia 05/29/2012  . Peripheral arterial disease   . Abdominal aortic aneurysm   . Congestive heart failure   . Coronary artery disease     Past Surgical History  Procedure Laterality Date  . Hernia repair    . Coronary stent placement    . Left heart catheterization with coronary angiogram N/A 05/30/2012    Procedure: LEFT HEART CATHETERIZATION WITH CORONARY ANGIOGRAM;  Surgeon: Troy Sine, MD;  Location: [facility] CATH LAB;  Service: Cardiovascular;  Laterality: N/A;    Social History   Social History  . Marital Status: Married    Spouse Name: N/A  . Number of Children: 2  . Years of Education: N/A   Occupational History  . Engineer, building services     Social History Main Topics  . Smoking status: Former Smoker -- 1.00 packs/day for 45 years    Types: Cigarettes    Quit date: 07/08/2012  . Smokeless tobacco: Never Used  . Alcohol Use: 8.4 oz/week    14 Cans of beer per week     Comment: 6 pack weekly of beer  . Drug Use: No  . Sexual Activity: Not on file   Other Topics Concern  . Not on file   Social History Narrative    Family History  Problem Relation Age of Onset  . Coronary artery disease Mother   . Coronary artery disease Sister   . Heart disease Mother   . Heart attack Mother   . Peripheral vascular disease Mother   . AAA (abdominal aortic aneurysm) Mother     Allergies as of 04/29/2015 - Review Complete 04/29/2015  Allergen Reaction Noted  . Codeine Itching and Rash 05/28/2012    Current Outpatient Prescriptions on File Prior to Visit  Medication Sig Dispense Refill  . aspirin 81 MG tablet Take 81 mg by mouth daily.    Marland Kitchen atorvastatin (LIPITOR) 40 MG tablet Take 1 tablet (40 mg total) by mouth daily at 6 PM. 30 tablet 6  . carvedilol (COREG) 12.5 MG tablet Take 1.5 tablets (18.75 mg total) by mouth 2 (two) times daily with a meal. 90 tablet 6  . lisinopril (PRINIVIL,ZESTRIL) 20 MG tablet Take 1 tablet (  20 mg total) by mouth daily. 90 tablet 1  . psyllium (METAMUCIL) 58.6 % powder Take 1 packet by mouth daily.    . nitroGLYCERIN (NITROSTAT) 0.4 MG SL tablet Place 1 tablet (0.4 mg total) under the tongue every 5 (five) minutes as needed for chest pain (up to 3 tabs in 15 mins). 25 tablet 2   No current facility-administered medications on file prior to visit.     REVIEW OF SYSTEMS: Cardiovascular: No chest pain, chest pressure, palpitations, orthopnea, or dyspnea on exertion. No history of DVT or phlebitis.  Positive for pain in his legs when walking Pulmonary: No productive cough, asthma or wheezing. Neurologic: No weakness, paresthesias, aphasia, or amaurosis. No dizziness. Hematologic: No bleeding  problems or clotting disorders. Musculoskeletal: No joint pain or joint swelling. Gastrointestinal: No blood in stool or hematemesis Genitourinary: No dysuria or hematuria. Psychiatric:: No history of major depression. Integumentary: No rashes or ulcers. Constitutional: No fever or chills.  PHYSICAL EXAMINATION:  Filed Vitals:   04/29/15 0830  BP: 125/79  Pulse: 66  Temp: 97.8 F (36.6 C)  TempSrc: Oral  Height: '5\' 10"'$  (1.778 m)  Weight: 139 lb 7 oz (63.248 kg)   Body mass index is 20.01 kg/(m^2). General: The patient appears their stated age.   HEENT:  No gross abnormalities Pulmonary: Respirations are non-labored Abdomen: Soft and non-tender.  Pulsatile mass present without tenderness  Musculoskeletal: There are no major deformities.   Neurologic: No focal weakness or paresthesias are detected, Skin: There are no ulcer or rashes noted. Psychiatric: The patient has normal affect. Cardiovascular: There is a regular rate and rhythm without significant murmur appreciated.  No carotid bruits.  Palpable pedal pulses.  Diagnostic Studies: I have reviewed his ultrasound which shows a 5.3 x 5.5 cm aneurysm. I have also reviewed his duplex which shows a mild amount of plaque in the left external iliac artery. ABI on the right was 0.98.  On the left was 0.86   Assessment:  Abdominal aortic aneurysm Plan: I discussed with the patient needs to get a CT scan to define his anatomy to determine if he is a candidate for endovascular repair.  I am going to do this expeditiously, as he is very concerned about his aneurysm.  He will get his CT scan this week and follow-up with me in the office on Monday.  He will also get preoperative carotid duplex and popliteal artery evaluation for aneurysm.     Eldridge Abrahams, M.D. [facility] [place] Office: [phone] Pager:  [phone]

## 2015-04-29 NOTE — Addendum Note (Signed)
Addended by: Dorthula Rue L on: 04/29/2015 09:50 AM   Modules accepted: Orders

## 2015-04-30 ENCOUNTER — Other Ambulatory Visit: Payer: Self-pay | Admitting: Surgery

## 2015-04-30 ENCOUNTER — Ambulatory Visit (HOSPITAL_COMMUNITY)
Admission: RE | Admit: 2015-04-30 | Discharge: 2015-04-30 | Disposition: A | Discharge status: Home / Self Care | Payer: BLUE CROSS/BLUE SHIELD | Source: Ambulatory Visit | Attending: Surgery | Admitting: Surgery

## 2015-04-30 DIAGNOSIS — Z01810 Encounter for preprocedural cardiovascular examination: Secondary | ICD-10-CM | POA: Insufficient documentation

## 2015-04-30 DIAGNOSIS — I6523 Occlusion and stenosis of bilateral carotid arteries: Secondary | ICD-10-CM | POA: Insufficient documentation

## 2015-04-30 DIAGNOSIS — I714 Abdominal aortic aneurysm, without rupture, unspecified: Secondary | ICD-10-CM

## 2015-05-02 ENCOUNTER — Ambulatory Visit
Admission: RE | Admit: 2015-05-02 | Discharge: 2015-05-02 | Disposition: A | Discharge status: Home / Self Care | Payer: BLUE CROSS/BLUE SHIELD | Source: Ambulatory Visit | Attending: Surgery | Admitting: Surgery

## 2015-05-02 ENCOUNTER — Ambulatory Visit (HOSPITAL_COMMUNITY)
Admission: RE | Admit: 2015-05-02 | Discharge: 2015-05-02 | Disposition: A | Discharge status: Home / Self Care | Payer: BLUE CROSS/BLUE SHIELD | Source: Ambulatory Visit | Attending: Surgery | Admitting: Surgery

## 2015-05-02 DIAGNOSIS — I714 Abdominal aortic aneurysm, without rupture, unspecified: Secondary | ICD-10-CM

## 2015-05-02 MED ORDER — IOPAMIDOL (ISOVUE-370) INJECTION 76%
80.0000 mL | Freq: Once | INTRAVENOUS | Status: AC | PRN
  Administered 2015-05-02: 80 mL via INTRAVENOUS

## 2015-05-03 ENCOUNTER — Encounter: Payer: Self-pay | Admitting: Surgery

## 2015-05-06 ENCOUNTER — Encounter: Payer: Self-pay | Admitting: Surgery

## 2015-05-06 ENCOUNTER — Ambulatory Visit (INDEPENDENT_AMBULATORY_CARE_PROVIDER_SITE_OTHER): Payer: BLUE CROSS/BLUE SHIELD | Admitting: Surgery

## 2015-05-06 VITALS — BP 165/92 | HR 71 | Temp 98.0°F | Ht 70.0 in | Wt 136.4 lb

## 2015-05-06 DIAGNOSIS — I714 Abdominal aortic aneurysm, without rupture, unspecified: Secondary | ICD-10-CM

## 2015-05-06 NOTE — Progress Notes (Signed)
Patient name: Adam Whyte Sr. MRN: [id number] DOB: Jan 15, 1951 Sex: male     Chief Complaint  Patient presents with  . Follow-up    1 week follow up, had studies done 05/02/2015.     HISTORY OF PRESENT ILLNESS: The patient is back for follow-up of his aneurysm.  He recently had a CT scan as well as multiple preoperative studies.  Past Medical History  Diagnosis Date  . Hypertension   . NSTEMI (non-ST elevated myocardial infarction). 05/28/12 05/28/2012  . HTN (hypertension) 05/28/2012  . Tobacco abuse 05/28/2012  . Thrombocytopenia 05/29/2012  . Peripheral arterial disease   . Abdominal aortic aneurysm   . Congestive heart failure   . Coronary artery disease     Past Surgical History  Procedure Laterality Date  . Hernia repair    . Coronary stent placement    . Left heart catheterization with coronary angiogram N/A 05/30/2012    Procedure: LEFT HEART CATHETERIZATION WITH CORONARY ANGIOGRAM;  Surgeon: Troy Sine, MD;  Location: [facility] CATH LAB;  Service: Cardiovascular;  Laterality: N/A;    Social History   Social History  . Marital Status: Married    Spouse Name: N/A  . Number of Children: 2  . Years of Education: N/A   Occupational History  . Engineer, building services    Social History Main Topics  . Smoking status: Former Smoker -- 1.00 packs/day for 45 years    Types: Cigarettes    Quit date: 07/08/2012  . Smokeless tobacco: Never Used  . Alcohol Use: 8.4 oz/week    14 Cans of beer per week     Comment: 6 pack weekly of beer  . Drug Use: No  . Sexual Activity: Not on file   Other Topics Concern  . Not on file   Social History Narrative    Family History  Problem Relation Age of Onset  . Coronary artery disease Mother   . Coronary artery disease Sister   . Heart disease Mother   . Heart attack Mother   . Peripheral vascular disease Mother   . AAA (abdominal aortic aneurysm) Mother     Allergies as of 05/06/2015 - Review Complete 05/06/2015  Allergen  Reaction Noted  . Codeine Itching and Rash 05/28/2012    Current Outpatient Prescriptions on File Prior to Visit  Medication Sig Dispense Refill  . aspirin 81 MG tablet Take 81 mg by mouth daily.    Marland Kitchen atorvastatin (LIPITOR) 40 MG tablet Take 1 tablet (40 mg total) by mouth daily at 6 PM. 30 tablet 6  . carvedilol (COREG) 12.5 MG tablet Take 1.5 tablets (18.75 mg total) by mouth 2 (two) times daily with a meal. 90 tablet 6  . lisinopril (PRINIVIL,ZESTRIL) 20 MG tablet Take 1 tablet (20 mg total) by mouth daily. 90 tablet 1  . psyllium (METAMUCIL) 58.6 % powder Take 1 packet by mouth daily.    . nitroGLYCERIN (NITROSTAT) 0.4 MG SL tablet Place 1 tablet (0.4 mg total) under the tongue every 5 (five) minutes as needed for chest pain (up to 3 tabs in 15 mins). 25 tablet 2   No current facility-administered medications on file prior to visit.     REVIEW OF SYSTEMS: No changes from prior exam 1 week ago  PHYSICAL EXAMINATION:   Vital signs are  Filed Vitals:   05/06/15 1520  BP: 165/92  Pulse: 71  Temp: 98 F (36.7 C)  TempSrc: Oral  Height: '5\' 10"'$  (1.778 m)  Weight:  136 lb 7 oz (61.888 kg)  SpO2: 100%   Body mass index is 19.58 kg/(m^2). General: The patient appears their stated age. HEENT:  No gross abnormalities Pulmonary:  Non labored breathing Abdomen: Soft and non-tender Musculoskeletal: There are no major deformities. Neurologic: No focal weakness or paresthesias are detected, Skin: There are no ulcer or rashes noted. Psychiatric: The patient has normal affect.   Diagnostic Studies have ordered and reviewed his carotid Doppler study which shows 40-59 percent bilateral stenosis.  No significant aneurysmal disease in the lower extremity.    I have reviewed his CT angiogram which has the following results:  .0 x 5.2 cm infrarenal abdominal aortic aneurysm. Recommend followup by abdomen and pelvis CTA in 3-6 months, and vascular surgery referral/consultation if not  already obtained. This recommendation follows ACR consensus guidelines: White Paper of the ACR Incidental Findings Committee II on Vascular Findings. J Am Coll Radiol 2013; 10:789-794.  No evidence of thoracic aortic aneurysm or dissection. Ectasia of the ascending thoracic aorta, measuring up to 3.6 cm.  4 mm posterior right upper lobe nodule. Given that patient is high risk for primary bronchogenic neoplasm, a single follow-up CT chest is suggested in 1 year   Assessment: Abdominal aortic aneurysm  Plan: I have reviewed the patient's CT scan with him.  There are several reasons that I think he is not a good candidate for endovascular repair.  First is the fact that he has a short, 12 mm infrarenal neck.  Second, he has bilateral high-grade external iliac disease which I think male create difficulty getting the device in place and will likely require subsequent stenting.  I told him I think the best option for him is to proceed with aortobifemoral bypass graft.  I discussed the risks and benefits of this operation including the risk of cardioplegic complications, wound complications intestinal ischemia lower extremity ischemia.  He wishes to proceed.  All his questions were answered today.  I spent in excess of 60 minutes discussing the patient's case with him and reviewing his imaging.  Greater than 50% of that was face-to-face.  I have scheduled his operation for Thursday, September 22.    Eldridge Abrahams, M.D. [facility] [place] Office: [phone] Pager:  [phone]

## 2015-05-07 ENCOUNTER — Other Ambulatory Visit: Payer: Self-pay

## 2015-05-20 NOTE — Pre-Procedure Instructions (Signed)
    Adam Luz Sr.  05/20/2015      [facility] [address], [place] - [place] [address] Phone: [phone] Fax: [phone]  [address], [place], [place] [address] [address] Phone: [phone] Fax: [place], [place] - [place] [address] Phone: [phone] Fax: [place], [place] [address] Phone: [phone] Fax: [phone]    Your procedure is scheduled on 05/30/15.  Report to [facility] Admitting at 530 A.M.  Call this number if you have problems the morning of surgery:  [phone]   Remember:  Do not eat food or drink liquids after midnight.  Take these medicines the morning of surgery with A SIP OF WATER-- cardedilol   Do not wear jewelry, make-up or nail polish.  Do not wear lotions, powders, or perfumes.  You may wear deodorant.  Do not shave 48 hours prior to surgery.  Men may shave face and neck.  Do not bring valuables to the hospital.  [facility] is not responsible for any belongings or valuables.  Contacts, dentures or bridgework may not be worn into surgery.  Leave your suitcase in the car.  After surgery it may be brought to your room.  For patients admitted to the hospital, discharge time will be determined by your treatment team.  Patients discharged the day of surgery will not be allowed to drive home.   Name and phone number of your driver:   Special instructions:    Please read over the following fact sheets that you were given. Pain Booklet, Coughing and Deep Breathing, Blood Transfusion Information, MRSA Information and Surgical Site Infection Prevention

## 2015-05-21 ENCOUNTER — Encounter (HOSPITAL_COMMUNITY): Payer: Self-pay

## 2015-05-21 ENCOUNTER — Encounter (HOSPITAL_COMMUNITY)
Admission: RE | Admit: 2015-05-21 | Discharge: 2015-05-21 | Disposition: A | Discharge status: Home / Self Care | Payer: BLUE CROSS/BLUE SHIELD | Source: Ambulatory Visit | Attending: Surgery | Admitting: Surgery

## 2015-05-21 DIAGNOSIS — I714 Abdominal aortic aneurysm, without rupture: Secondary | ICD-10-CM | POA: Insufficient documentation

## 2015-05-21 DIAGNOSIS — Z0183 Encounter for blood typing: Secondary | ICD-10-CM | POA: Insufficient documentation

## 2015-05-21 DIAGNOSIS — Z01812 Encounter for preprocedural laboratory examination: Secondary | ICD-10-CM | POA: Diagnosis present

## 2015-05-21 HISTORY — DX: Reserved for inherently not codable concepts without codable children: IMO0001

## 2015-05-21 HISTORY — DX: Personal history of other diseases of the digestive system: Z87.19

## 2015-05-21 LAB — COMPREHENSIVE METABOLIC PANEL
ALBUMIN: 4 g/dL (ref 3.5–5.0)
ALT: 31 U/L (ref 17–63)
AST: 40 U/L (ref 15–41)
Alkaline Phosphatase: 75 U/L (ref 38–126)
Anion gap: 8 (ref 5–15)
BUN: 13 mg/dL (ref 6–20)
CHLORIDE: 101 mmol/L (ref 101–111)
CO2: 22 mmol/L (ref 22–32)
CREATININE: 1.33 mg/dL — AB (ref 0.61–1.24)
Calcium: 9.5 mg/dL (ref 8.9–10.3)
GFR calc Af Amer: >60 mL/min (ref >60)
GFR calc non Af Amer: 55 mL/min — ABNORMAL LOW (ref >60)
GLUCOSE: 105 mg/dL — AB (ref 65–99)
POTASSIUM: 5 mmol/L (ref 3.5–5.1)
SODIUM: 131 mmol/L — AB (ref 135–145)
Total Bilirubin: 0.8 mg/dL (ref 0.3–1.2)
Total Protein: 7.2 g/dL (ref 6.5–8.1)

## 2015-05-21 LAB — CBC
HCT: 38 % — ABNORMAL LOW (ref 39.0–52.0)
HEMOGLOBIN: 12.6 g/dL — AB (ref 13.0–17.0)
MCH: 32.5 pg (ref 26.0–34.0)
MCHC: 33.2 g/dL (ref 30.0–36.0)
MCV: 97.9 fL (ref 78.0–100.0)
Platelets: 131 10*3/uL — ABNORMAL LOW (ref 150–400)
RBC: 3.88 MIL/uL — ABNORMAL LOW (ref 4.22–5.81)
RDW: 12.4 % (ref 11.5–15.5)
WBC: 3.5 10*3/uL — ABNORMAL LOW (ref 4.0–10.5)

## 2015-05-21 LAB — BLOOD GAS, ARTERIAL
Acid-base deficit: 2.1 mmol/L — ABNORMAL HIGH (ref 0.0–2.0)
BICARBONATE: 21.5 meq/L (ref 20.0–24.0)
Drawn by: 421801
O2 CONTENT: 0.2 L/min
O2 SAT: 98.3 %
PO2 ART: 107 mmHg — AB (ref 80.0–100.0)
Patient temperature: 98.6
TCO2: 22.5 mmol/L (ref 0–100)
pCO2 arterial: 32.7 mmHg — ABNORMAL LOW (ref 35.0–45.0)
pH, Arterial: 7.434 (ref 7.350–7.450)

## 2015-05-21 LAB — URINALYSIS, ROUTINE W REFLEX MICROSCOPIC
BILIRUBIN URINE: NEGATIVE
GLUCOSE, UA: NEGATIVE mg/dL
Hgb urine dipstick: NEGATIVE
KETONES UR: NEGATIVE mg/dL
LEUKOCYTES UA: NEGATIVE
NITRITE: NEGATIVE
PH: 7 (ref 5.0–8.0)
PROTEIN: NEGATIVE mg/dL
Specific Gravity, Urine: 1.008 (ref 1.005–1.030)
Urobilinogen, UA: 1 mg/dL (ref 0.0–1.0)

## 2015-05-21 LAB — SURGICAL PCR SCREEN
MRSA, PCR: NEGATIVE
STAPHYLOCOCCUS AUREUS: NEGATIVE

## 2015-05-21 LAB — APTT: APTT: 29 s (ref 24–37)

## 2015-05-21 LAB — PROTIME-INR
INR: 1.07 (ref 0.00–1.49)
Prothrombin Time: 14.1 seconds (ref 11.6–15.2)

## 2015-05-21 LAB — ABO/RH: ABO/RH(D): A NEG

## 2015-05-21 MED ORDER — CHLORHEXIDINE GLUCONATE CLOTH 2 % EX PADS: 6.0000 | MEDICATED_PAD | Freq: Once | CUTANEOUS | Status: DC

## 2015-05-30 ENCOUNTER — Inpatient Hospital Stay (HOSPITAL_COMMUNITY)
Admission: RE | Admit: 2015-05-30 | Discharge: 2015-06-06 | DRG: 271 | Disposition: A | Discharge status: Home Health | Payer: BLUE CROSS/BLUE SHIELD | Source: Ambulatory Visit | Attending: Surgery | Admitting: Surgery

## 2015-05-30 ENCOUNTER — Encounter (HOSPITAL_COMMUNITY): Payer: Self-pay | Admitting: *Deleted

## 2015-05-30 ENCOUNTER — Encounter (HOSPITAL_COMMUNITY)
Admission: RE | Disposition: A | Discharge status: Home Health | Payer: BLUE CROSS/BLUE SHIELD | Source: Ambulatory Visit | Attending: Surgery

## 2015-05-30 ENCOUNTER — Inpatient Hospital Stay (HOSPITAL_COMMUNITY): Payer: BLUE CROSS/BLUE SHIELD | Admitting: Anesthesiology

## 2015-05-30 ENCOUNTER — Inpatient Hospital Stay (HOSPITAL_COMMUNITY): Payer: BLUE CROSS/BLUE SHIELD

## 2015-05-30 DIAGNOSIS — R062 Wheezing: Secondary | ICD-10-CM

## 2015-05-30 DIAGNOSIS — I252 Old myocardial infarction: Secondary | ICD-10-CM | POA: Diagnosis not present

## 2015-05-30 DIAGNOSIS — Z8679 Personal history of other diseases of the circulatory system: Secondary | ICD-10-CM

## 2015-05-30 DIAGNOSIS — I70213 Atherosclerosis of native arteries of extremities with intermittent claudication, bilateral legs: Secondary | ICD-10-CM | POA: Diagnosis present

## 2015-05-30 DIAGNOSIS — Z87891 Personal history of nicotine dependence: Secondary | ICD-10-CM | POA: Diagnosis not present

## 2015-05-30 DIAGNOSIS — Z955 Presence of coronary angioplasty implant and graft: Secondary | ICD-10-CM

## 2015-05-30 DIAGNOSIS — E46 Unspecified protein-calorie malnutrition: Secondary | ICD-10-CM | POA: Diagnosis present

## 2015-05-30 DIAGNOSIS — Z9889 Other specified postprocedural states: Secondary | ICD-10-CM

## 2015-05-30 DIAGNOSIS — I714 Abdominal aortic aneurysm, without rupture, unspecified: Secondary | ICD-10-CM | POA: Diagnosis present

## 2015-05-30 DIAGNOSIS — D62 Acute posthemorrhagic anemia: Secondary | ICD-10-CM | POA: Diagnosis not present

## 2015-05-30 DIAGNOSIS — I2510 Atherosclerotic heart disease of native coronary artery without angina pectoris: Secondary | ICD-10-CM | POA: Diagnosis present

## 2015-05-30 DIAGNOSIS — E871 Hypo-osmolality and hyponatremia: Secondary | ICD-10-CM | POA: Diagnosis not present

## 2015-05-30 DIAGNOSIS — F10239 Alcohol dependence with withdrawal, unspecified: Secondary | ICD-10-CM | POA: Diagnosis present

## 2015-05-30 DIAGNOSIS — Z7982 Long term (current) use of aspirin: Secondary | ICD-10-CM | POA: Diagnosis not present

## 2015-05-30 DIAGNOSIS — I10 Essential (primary) hypertension: Secondary | ICD-10-CM | POA: Diagnosis present

## 2015-05-30 HISTORY — PX: AORTA - BILATERAL FEMORAL ARTERY BYPASS GRAFT: SHX1175

## 2015-05-30 LAB — BASIC METABOLIC PANEL
ANION GAP: 7 (ref 5–15)
BUN: 12 mg/dL (ref 6–20)
CHLORIDE: 105 mmol/L (ref 101–111)
CO2: 21 mmol/L — ABNORMAL LOW (ref 22–32)
Calcium: 8.2 mg/dL — ABNORMAL LOW (ref 8.9–10.3)
Creatinine, Ser: 1.37 mg/dL — ABNORMAL HIGH (ref 0.61–1.24)
GFR calc non Af Amer: 53 mL/min — ABNORMAL LOW (ref >60)
Glucose, Bld: 107 mg/dL — ABNORMAL HIGH (ref 65–99)
POTASSIUM: 5 mmol/L (ref 3.5–5.1)
SODIUM: 133 mmol/L — AB (ref 135–145)

## 2015-05-30 LAB — POCT I-STAT 7, (LYTES, BLD GAS, ICA,H+H)
Acid-base deficit: 4 mmol/L — ABNORMAL HIGH (ref 0.0–2.0)
BICARBONATE: 20.8 meq/L (ref 20.0–24.0)
Calcium, Ion: 1.1 mmol/L — ABNORMAL LOW (ref 1.13–1.30)
HCT: 27 % — ABNORMAL LOW (ref 39.0–52.0)
HEMOGLOBIN: 9.2 g/dL — AB (ref 13.0–17.0)
O2 Saturation: 100 %
PO2 ART: 264 mmHg — AB (ref 80.0–100.0)
Potassium: 5.3 mmol/L — ABNORMAL HIGH (ref 3.5–5.1)
SODIUM: 128 mmol/L — AB (ref 135–145)
TCO2: 22 mmol/L (ref 0–100)
pCO2 arterial: 34.6 mmHg — ABNORMAL LOW (ref 35.0–45.0)
pH, Arterial: 7.388 (ref 7.350–7.450)

## 2015-05-30 LAB — BLOOD GAS, ARTERIAL
ACID-BASE DEFICIT: 5.9 mmol/L — AB (ref 0.0–2.0)
Bicarbonate: 19.2 mEq/L — ABNORMAL LOW (ref 20.0–24.0)
O2 CONTENT: 2 L/min
O2 Saturation: 96.6 %
PATIENT TEMPERATURE: 98.6
PCO2 ART: 39.6 mmHg (ref 35.0–45.0)
PH ART: 7.308 — AB (ref 7.350–7.450)
PO2 ART: 97 mmHg (ref 80.0–100.0)
TCO2: 20.4 mmol/L (ref 0–100)

## 2015-05-30 LAB — CBC
HCT: 31.2 % — ABNORMAL LOW (ref 39.0–52.0)
HEMOGLOBIN: 10.4 g/dL — AB (ref 13.0–17.0)
MCH: 32.9 pg (ref 26.0–34.0)
MCHC: 33.3 g/dL (ref 30.0–36.0)
MCV: 98.7 fL (ref 78.0–100.0)
Platelets: 132 10*3/uL — ABNORMAL LOW (ref 150–400)
RBC: 3.16 MIL/uL — AB (ref 4.22–5.81)
RDW: 12.3 % (ref 11.5–15.5)
WBC: 9.9 10*3/uL (ref 4.0–10.5)

## 2015-05-30 LAB — PROTIME-INR
INR: 1.46 (ref 0.00–1.49)
PROTHROMBIN TIME: 17.8 s — AB (ref 11.6–15.2)

## 2015-05-30 LAB — PREPARE RBC (CROSSMATCH)

## 2015-05-30 LAB — MAGNESIUM: Magnesium: 1.3 mg/dL — ABNORMAL LOW (ref 1.7–2.4)

## 2015-05-30 LAB — POCT I-STAT GLUCOSE
GLUCOSE: 92 mg/dL (ref 65–99)
OPERATOR ID: 190282

## 2015-05-30 LAB — APTT: APTT: 32 s (ref 24–37)

## 2015-05-30 SURGERY — CREATION, BYPASS, ARTERIAL, AORTA TO FEMORAL, BILATERAL, USING GRAFT
Anesthesia: General | Site: Abdomen | Laterality: Bilateral

## 2015-05-30 MED ORDER — ENOXAPARIN SODIUM 30 MG/0.3ML ~~LOC~~ SOLN
30.0000 mg | SUBCUTANEOUS | Status: DC
  Administered 2015-05-31 – 2015-06-05 (×6): 30 mg via SUBCUTANEOUS
  Filled 2015-05-30 (×7): qty 0.3

## 2015-05-30 MED ORDER — DEXAMETHASONE SODIUM PHOSPHATE 4 MG/ML IJ SOLN
INTRAMUSCULAR | Status: AC
  Filled 2015-05-30: qty 1

## 2015-05-30 MED ORDER — SUFENTANIL CITRATE 50 MCG/ML IV SOLN
INTRAVENOUS | Status: AC
  Filled 2015-05-30: qty 1

## 2015-05-30 MED ORDER — PNEUMOCOCCAL VAC POLYVALENT 25 MCG/0.5ML IJ INJ: 0.5000 mL | INJECTION | INTRAMUSCULAR | Status: DC | PRN

## 2015-05-30 MED ORDER — INFLUENZA VAC SPLIT QUAD 0.5 ML IM SUSY: 0.5000 mL | PREFILLED_SYRINGE | INTRAMUSCULAR | Status: DC | PRN

## 2015-05-30 MED ORDER — DEXAMETHASONE SODIUM PHOSPHATE 4 MG/ML IJ SOLN
INTRAMUSCULAR | Status: DC | PRN
  Administered 2015-05-30: 4 mg via INTRAVENOUS

## 2015-05-30 MED ORDER — PROTAMINE SULFATE 10 MG/ML IV SOLN
INTRAVENOUS | Status: DC | PRN
  Administered 2015-05-30 (×5): 10 mg via INTRAVENOUS

## 2015-05-30 MED ORDER — ACETAMINOPHEN 325 MG PO TABS
325.0000 mg | ORAL_TABLET | ORAL | Status: DC | PRN
  Administered 2015-06-01 – 2015-06-04 (×3): 650 mg via ORAL
  Administered 2015-06-05: 325 mg via ORAL
  Filled 2015-05-30 (×4): qty 2

## 2015-05-30 MED ORDER — ACETAMINOPHEN 650 MG RE SUPP: 325.0000 mg | RECTAL | Status: DC | PRN

## 2015-05-30 MED ORDER — HEMOSTATIC AGENTS (NO CHARGE) OPTIME
TOPICAL | Status: DC | PRN
  Administered 2015-05-30 (×2): 1 via TOPICAL

## 2015-05-30 MED ORDER — EPHEDRINE SULFATE 50 MG/ML IJ SOLN
INTRAMUSCULAR | Status: DC | PRN
  Administered 2015-05-30 (×4): 5 mg via INTRAVENOUS

## 2015-05-30 MED ORDER — LIDOCAINE HCL (CARDIAC) 20 MG/ML IV SOLN
INTRAVENOUS | Status: AC
Start: 2015-05-30 — End: 2015-05-30
  Filled 2015-05-30: qty 5

## 2015-05-30 MED ORDER — LACTATED RINGERS IV SOLN
INTRAVENOUS | Status: DC | PRN
  Administered 2015-05-30 (×2): via INTRAVENOUS

## 2015-05-30 MED ORDER — PHENYLEPHRINE HCL 10 MG/ML IJ SOLN
10.0000 mg | INTRAVENOUS | Status: DC | PRN
  Administered 2015-05-30: 50 ug/min via INTRAVENOUS

## 2015-05-30 MED ORDER — ANTISEPTIC ORAL RINSE SOLUTION (CORINZ)
7.0000 mL | Freq: Four times a day (QID) | OROMUCOSAL | Status: DC
  Administered 2015-05-30 – 2015-06-03 (×13): 7 mL via OROMUCOSAL

## 2015-05-30 MED ORDER — GUAIFENESIN-DM 100-10 MG/5ML PO SYRP
15.0000 mL | ORAL_SOLUTION | ORAL | Status: DC | PRN
  Administered 2015-06-05: 15 mL via ORAL
  Filled 2015-05-30: qty 15

## 2015-05-30 MED ORDER — MEPERIDINE HCL 25 MG/ML IJ SOLN: 6.2500 mg | INTRAMUSCULAR | Status: DC | PRN

## 2015-05-30 MED ORDER — PHENOL 1.4 % MT LIQD: 1.0000 | OROMUCOSAL | Status: DC | PRN

## 2015-05-30 MED ORDER — SUGAMMADEX SODIUM 200 MG/2ML IV SOLN
INTRAVENOUS | Status: DC | PRN
  Administered 2015-05-30: 124.2 mg via INTRAVENOUS

## 2015-05-30 MED ORDER — LABETALOL HCL 5 MG/ML IV SOLN
10.0000 mg | INTRAVENOUS | Status: AC | PRN
  Administered 2015-06-01 (×4): 10 mg via INTRAVENOUS
  Filled 2015-05-30 (×4): qty 4

## 2015-05-30 MED ORDER — STERILE WATER FOR INJECTION IJ SOLN
INTRAMUSCULAR | Status: AC
  Filled 2015-05-30: qty 10

## 2015-05-30 MED ORDER — PROMETHAZINE HCL 25 MG/ML IJ SOLN: 6.2500 mg | INTRAMUSCULAR | Status: DC | PRN

## 2015-05-30 MED ORDER — ROCURONIUM BROMIDE 100 MG/10ML IV SOLN
INTRAVENOUS | Status: DC | PRN
  Administered 2015-05-30: 30 mg via INTRAVENOUS
  Administered 2015-05-30: 20 mg via INTRAVENOUS

## 2015-05-30 MED ORDER — LIDOCAINE HCL (CARDIAC) 20 MG/ML IV SOLN
INTRAVENOUS | Status: DC | PRN
Start: 2015-05-30 — End: 2015-05-30
  Administered 2015-05-30: 80 mg via INTRAVENOUS

## 2015-05-30 MED ORDER — HYDRALAZINE HCL 20 MG/ML IJ SOLN
5.0000 mg | INTRAMUSCULAR | Status: DC | PRN
Start: 2015-05-30 — End: 2015-06-01
  Administered 2015-06-01: 5 mg via INTRAVENOUS
  Filled 2015-05-30: qty 1

## 2015-05-30 MED ORDER — HYDROMORPHONE HCL 1 MG/ML IJ SOLN
INTRAMUSCULAR | Status: AC
  Filled 2015-05-30: qty 1

## 2015-05-30 MED ORDER — DOCUSATE SODIUM 100 MG PO CAPS
100.0000 mg | ORAL_CAPSULE | Freq: Every day | ORAL | Status: DC
  Administered 2015-06-03 – 2015-06-06 (×3): 100 mg via ORAL
  Filled 2015-05-30 (×3): qty 1

## 2015-05-30 MED ORDER — SUGAMMADEX SODIUM 200 MG/2ML IV SOLN
INTRAVENOUS | Status: AC
  Filled 2015-05-30: qty 2

## 2015-05-30 MED ORDER — PHENYLEPHRINE 40 MCG/ML (10ML) SYRINGE FOR IV PUSH (FOR BLOOD PRESSURE SUPPORT)
PREFILLED_SYRINGE | INTRAVENOUS | Status: AC
  Filled 2015-05-30: qty 10

## 2015-05-30 MED ORDER — HYDROMORPHONE HCL 1 MG/ML IJ SOLN
0.2500 mg | INTRAMUSCULAR | Status: DC | PRN
  Administered 2015-05-30 (×4): 0.5 mg via INTRAVENOUS

## 2015-05-30 MED ORDER — ALBUMIN HUMAN 5 % IV SOLN
INTRAVENOUS | Status: DC | PRN
  Administered 2015-05-30 (×2): via INTRAVENOUS

## 2015-05-30 MED ORDER — SODIUM CHLORIDE 0.9 % IJ SOLN
INTRAMUSCULAR | Status: AC
  Filled 2015-05-30: qty 20

## 2015-05-30 MED ORDER — HYDRALAZINE HCL 20 MG/ML IJ SOLN
INTRAMUSCULAR | Status: AC
  Filled 2015-05-30: qty 1

## 2015-05-30 MED ORDER — ONDANSETRON HCL 4 MG/2ML IJ SOLN
4.0000 mg | Freq: Four times a day (QID) | INTRAMUSCULAR | Status: DC | PRN
  Administered 2015-05-30 – 2015-05-31 (×2): 4 mg via INTRAVENOUS
  Filled 2015-05-30 (×2): qty 2

## 2015-05-30 MED ORDER — MIDAZOLAM HCL 2 MG/2ML IJ SOLN
INTRAMUSCULAR | Status: AC
  Filled 2015-05-30: qty 4

## 2015-05-30 MED ORDER — ONDANSETRON HCL 4 MG/2ML IJ SOLN
INTRAMUSCULAR | Status: AC
  Filled 2015-05-30: qty 2

## 2015-05-30 MED ORDER — VECURONIUM BROMIDE 10 MG IV SOLR
INTRAVENOUS | Status: AC
  Filled 2015-05-30: qty 10

## 2015-05-30 MED ORDER — LACTATED RINGERS IV SOLN
INTRAVENOUS | Status: DC | PRN
  Administered 2015-05-30: 07:00:00 via INTRAVENOUS

## 2015-05-30 MED ORDER — POTASSIUM CHLORIDE CRYS ER 20 MEQ PO TBCR: 20.0000 meq | EXTENDED_RELEASE_TABLET | Freq: Once | ORAL | Status: DC | PRN

## 2015-05-30 MED ORDER — METOPROLOL TARTRATE 1 MG/ML IV SOLN
2.0000 mg | INTRAVENOUS | Status: AC | PRN
  Administered 2015-06-01 – 2015-06-02 (×2): 5 mg via INTRAVENOUS
  Filled 2015-05-30 (×2): qty 5

## 2015-05-30 MED ORDER — METOPROLOL TARTRATE 1 MG/ML IV SOLN
2.5000 mg | Freq: Four times a day (QID) | INTRAVENOUS | Status: DC
  Administered 2015-05-30 – 2015-06-06 (×28): 2.5 mg via INTRAVENOUS
  Filled 2015-05-30 (×33): qty 5

## 2015-05-30 MED ORDER — DIPHENHYDRAMINE HCL 12.5 MG/5ML PO ELIX
12.5000 mg | ORAL_SOLUTION | Freq: Four times a day (QID) | ORAL | Status: DC | PRN
  Filled 2015-05-30: qty 5

## 2015-05-30 MED ORDER — NITROGLYCERIN 0.4 MG SL SUBL: 0.4000 mg | SUBLINGUAL_TABLET | SUBLINGUAL | Status: DC | PRN

## 2015-05-30 MED ORDER — DEXTROSE-NACL 5-0.45 % IV SOLN
INTRAVENOUS | Status: DC
  Administered 2015-05-30: 1 mL via INTRAVENOUS
  Administered 2015-05-31: 02:00:00 via INTRAVENOUS

## 2015-05-30 MED ORDER — SODIUM CHLORIDE 0.9 % IV SOLN
INTRAVENOUS | Status: DC | PRN
  Administered 2015-05-30: 500 mL

## 2015-05-30 MED ORDER — LABETALOL HCL 5 MG/ML IV SOLN
INTRAVENOUS | Status: DC | PRN
  Administered 2015-05-30 (×2): 2.5 mg via INTRAVENOUS

## 2015-05-30 MED ORDER — PANTOPRAZOLE SODIUM 40 MG PO TBEC
40.0000 mg | DELAYED_RELEASE_TABLET | Freq: Every day | ORAL | Status: DC
  Administered 2015-06-03 – 2015-06-06 (×4): 40 mg via ORAL
  Filled 2015-05-30 (×4): qty 1

## 2015-05-30 MED ORDER — SODIUM CHLORIDE 0.9 % IJ SOLN: 9.0000 mL | INTRAMUSCULAR | Status: DC | PRN

## 2015-05-30 MED ORDER — PANTOPRAZOLE SODIUM 40 MG IV SOLR
40.0000 mg | Freq: Every day | INTRAVENOUS | Status: DC
Start: 2015-05-30 — End: 2015-06-04
  Administered 2015-05-30 – 2015-06-03 (×5): 40 mg via INTRAVENOUS
  Filled 2015-05-30 (×8): qty 40

## 2015-05-30 MED ORDER — PROPOFOL 10 MG/ML IV BOLUS
INTRAVENOUS | Status: DC | PRN
  Administered 2015-05-30: 150 mg via INTRAVENOUS

## 2015-05-30 MED ORDER — MIDAZOLAM HCL 5 MG/5ML IJ SOLN
INTRAMUSCULAR | Status: DC | PRN
  Administered 2015-05-30 (×2): 1 mg via INTRAVENOUS

## 2015-05-30 MED ORDER — MORPHINE SULFATE 1 MG/ML IV SOLN
INTRAVENOUS | Status: AC
  Filled 2015-05-30: qty 25

## 2015-05-30 MED ORDER — DEXTROSE 5 % IV SOLN
1.5000 g | Freq: Two times a day (BID) | INTRAVENOUS | Status: AC
  Administered 2015-05-30 – 2015-05-31 (×2): 1.5 g via INTRAVENOUS
  Filled 2015-05-30 (×2): qty 1.5

## 2015-05-30 MED ORDER — HEPARIN SODIUM (PORCINE) 1000 UNIT/ML IJ SOLN
INTRAMUSCULAR | Status: DC | PRN
  Administered 2015-05-30: 6500 [IU] via INTRAVENOUS
  Administered 2015-05-30: 2000 [IU] via INTRAVENOUS

## 2015-05-30 MED ORDER — SUFENTANIL CITRATE 50 MCG/ML IV SOLN
INTRAVENOUS | Status: DC | PRN
  Administered 2015-05-30 (×5): 5 ug via INTRAVENOUS
  Administered 2015-05-30: 10 ug via INTRAVENOUS
  Administered 2015-05-30 (×4): 5 ug via INTRAVENOUS

## 2015-05-30 MED ORDER — LACTATED RINGERS IV SOLN: INTRAVENOUS | Status: DC

## 2015-05-30 MED ORDER — ALUM & MAG HYDROXIDE-SIMETH 200-200-20 MG/5ML PO SUSP: 15.0000 mL | ORAL | Status: DC | PRN

## 2015-05-30 MED ORDER — EPHEDRINE SULFATE 50 MG/ML IJ SOLN
INTRAMUSCULAR | Status: AC
  Filled 2015-05-30: qty 1

## 2015-05-30 MED ORDER — SODIUM CHLORIDE 0.9 % IV SOLN
INTRAVENOUS | Status: DC | PRN
  Administered 2015-05-30 (×2): via INTRAVENOUS

## 2015-05-30 MED ORDER — SODIUM CHLORIDE 0.9 % IV SOLN: INTRAVENOUS | Status: DC

## 2015-05-30 MED ORDER — MORPHINE SULFATE 1 MG/ML IV SOLN
INTRAVENOUS | Status: DC
  Administered 2015-05-30: 6.5 mg via INTRAVENOUS
  Administered 2015-05-30: 13:00:00 via INTRAVENOUS
  Administered 2015-05-30: 6 mg via INTRAVENOUS
  Administered 2015-05-31: 15 mg via INTRAVENOUS
  Administered 2015-05-31: 4.5 mg via INTRAVENOUS
  Administered 2015-05-31: 13:00:00 via INTRAVENOUS
  Administered 2015-05-31: 3 mg via INTRAVENOUS
  Administered 2015-05-31: 6 mg via INTRAVENOUS
  Administered 2015-05-31: 13.96 mg via INTRAVENOUS
  Administered 2015-05-31: 02:00:00 via INTRAVENOUS
  Administered 2015-05-31: 3 mg via INTRAVENOUS
  Administered 2015-06-01: 0 mg via INTRAVENOUS
  Administered 2015-06-01: 4.5 mg via INTRAVENOUS
  Administered 2015-06-01: 0 mg via INTRAVENOUS
  Administered 2015-06-01: 1.5 mg via INTRAVENOUS
  Administered 2015-06-01: 0 mg via INTRAVENOUS
  Administered 2015-06-01: 11:00:00 via INTRAVENOUS
  Administered 2015-06-02 (×5): 0 mg via INTRAVENOUS
  Filled 2015-05-30 (×4): qty 25

## 2015-05-30 MED ORDER — 0.9 % SODIUM CHLORIDE (POUR BTL) OPTIME
TOPICAL | Status: DC | PRN
  Administered 2015-05-30: 2000 mL

## 2015-05-30 MED ORDER — SUCCINYLCHOLINE CHLORIDE 20 MG/ML IJ SOLN
INTRAMUSCULAR | Status: AC
  Filled 2015-05-30: qty 1

## 2015-05-30 MED ORDER — MAGNESIUM SULFATE 2 GM/50ML IV SOLN
2.0000 g | Freq: Once | INTRAVENOUS | Status: AC | PRN
  Administered 2015-05-30: 2 g via INTRAVENOUS
  Filled 2015-05-30: qty 50

## 2015-05-30 MED ORDER — DEXTROSE 5 % IV SOLN
1.5000 g | INTRAVENOUS | Status: AC
  Administered 2015-05-30: 1.5 g via INTRAVENOUS
  Filled 2015-05-30: qty 1.5

## 2015-05-30 MED ORDER — LABETALOL HCL 5 MG/ML IV SOLN
INTRAVENOUS | Status: AC
  Filled 2015-05-30: qty 4

## 2015-05-30 MED ORDER — ONDANSETRON HCL 4 MG/2ML IJ SOLN
INTRAMUSCULAR | Status: DC | PRN
  Administered 2015-05-30: 4 mg via INTRAVENOUS

## 2015-05-30 MED ORDER — DIPHENHYDRAMINE HCL 50 MG/ML IJ SOLN
12.5000 mg | Freq: Four times a day (QID) | INTRAMUSCULAR | Status: DC | PRN
  Administered 2015-06-01 – 2015-06-02 (×3): 12.5 mg via INTRAVENOUS
  Filled 2015-05-30 (×3): qty 1

## 2015-05-30 MED ORDER — PROPOFOL 10 MG/ML IV BOLUS
INTRAVENOUS | Status: AC
  Filled 2015-05-30: qty 20

## 2015-05-30 MED ORDER — VECURONIUM BROMIDE 10 MG IV SOLR
INTRAVENOUS | Status: DC | PRN
  Administered 2015-05-30: 2 mg via INTRAVENOUS
  Administered 2015-05-30: 1 mg via INTRAVENOUS
  Administered 2015-05-30: 2 mg via INTRAVENOUS
  Administered 2015-05-30: 1 mg via INTRAVENOUS

## 2015-05-30 MED ORDER — NALOXONE HCL 0.4 MG/ML IJ SOLN: 0.4000 mg | INTRAMUSCULAR | Status: DC | PRN

## 2015-05-30 MED ORDER — SODIUM CHLORIDE 0.9 % IV SOLN: 500.0000 mL | Freq: Once | INTRAVENOUS | Status: DC | PRN

## 2015-05-30 MED ORDER — ROCURONIUM BROMIDE 50 MG/5ML IV SOLN
INTRAVENOUS | Status: AC
  Filled 2015-05-30: qty 1

## 2015-05-30 SURGICAL SUPPLY — 67 items
CANISTER SUCTION 2500CC (MISCELLANEOUS) ×3 IMPLANT
CLIP TI MEDIUM 24 (CLIP) ×3 IMPLANT
CLIP TI WIDE RED SMALL 24 (CLIP) ×3 IMPLANT
COVER MAYO STAND STRL (DRAPES) IMPLANT
ELECT BLADE 4.0 EZ CLEAN MEGAD (MISCELLANEOUS) ×3
ELECT BLADE 6.5 EXT (BLADE) IMPLANT
ELECT CAUTERY BLADE 6.4 (BLADE) ×3 IMPLANT
ELECT REM PT RETURN 9FT ADLT (ELECTROSURGICAL) ×3
ELECTRODE BLDE 4.0 EZ CLN MEGD (MISCELLANEOUS) ×1 IMPLANT
ELECTRODE REM PT RTRN 9FT ADLT (ELECTROSURGICAL) ×1 IMPLANT
FELT TEFLON 4 X1 (Mesh General) ×3 IMPLANT
GLOVE BIO SURGEON STRL SZ 6.5 (GLOVE) ×4 IMPLANT
GLOVE BIO SURGEON STRL SZ7 (GLOVE) ×9 IMPLANT
GLOVE BIO SURGEONS STRL SZ 6.5 (GLOVE) ×2
GLOVE BIOGEL PI IND STRL 6.5 (GLOVE) ×1 IMPLANT
GLOVE BIOGEL PI IND STRL 7.0 (GLOVE) ×3 IMPLANT
GLOVE BIOGEL PI IND STRL 7.5 (GLOVE) ×3 IMPLANT
GLOVE BIOGEL PI INDICATOR 6.5 (GLOVE) ×2
GLOVE BIOGEL PI INDICATOR 7.0 (GLOVE) ×6
GLOVE BIOGEL PI INDICATOR 7.5 (GLOVE) ×6
GLOVE SS BIOGEL STRL SZ 6.5 (GLOVE) ×1 IMPLANT
GLOVE SS BIOGEL STRL SZ 7 (GLOVE) ×1 IMPLANT
GLOVE SUPERSENSE BIOGEL SZ 6.5 (GLOVE) ×2
GLOVE SUPERSENSE BIOGEL SZ 7 (GLOVE) ×2
GLOVE SURG SS PI 7.0 STRL IVOR (GLOVE) ×6 IMPLANT
GLOVE SURG SS PI 7.5 STRL IVOR (GLOVE) ×6 IMPLANT
GOWN STRL REUS W/ TWL LRG LVL3 (GOWN DISPOSABLE) ×4 IMPLANT
GOWN STRL REUS W/ TWL XL LVL3 (GOWN DISPOSABLE) ×4 IMPLANT
GOWN STRL REUS W/TWL LRG LVL3 (GOWN DISPOSABLE) ×8
GOWN STRL REUS W/TWL XL LVL3 (GOWN DISPOSABLE) ×8
GRAFT HEMASHIELD 18X9MM (Vascular Products) ×3 IMPLANT
GRAFT HEMASHIELD 22X30M (Vascular Products) ×3 IMPLANT
HEMOSTAT SNOW SURGICEL 2X4 (HEMOSTASIS) ×3 IMPLANT
INSERT FOGARTY 61MM (MISCELLANEOUS) ×6 IMPLANT
INSERT FOGARTY SM (MISCELLANEOUS) ×6 IMPLANT
KIT BASIN OR (CUSTOM PROCEDURE TRAY) ×3 IMPLANT
KIT ROOM TURNOVER OR (KITS) ×3 IMPLANT
LIQUID BAND (GAUZE/BANDAGES/DRESSINGS) ×6 IMPLANT
NS IRRIG 1000ML POUR BTL (IV SOLUTION) ×6 IMPLANT
PACK AORTA (CUSTOM PROCEDURE TRAY) ×3 IMPLANT
PAD ARMBOARD 7.5X6 YLW CONV (MISCELLANEOUS) ×6 IMPLANT
PENCIL BUTTON HOLSTER BLD 10FT (ELECTRODE) ×3 IMPLANT
SLEEVE SURGEON STRL (DRAPES) ×3 IMPLANT
SUT ETHIBOND 5 LR DA (SUTURE) IMPLANT
SUT PDS AB 1 TP1 54 (SUTURE) ×6 IMPLANT
SUT PROLENE 3 0 SH 48 (SUTURE) ×12 IMPLANT
SUT PROLENE 5 0 C 1 24 (SUTURE) ×30 IMPLANT
SUT PROLENE 5 0 C 1 36 (SUTURE) IMPLANT
SUT PROLENE 6 0 BV (SUTURE) ×3 IMPLANT
SUT SILK 2 0 (SUTURE) ×2
SUT SILK 2 0 TIES 17X18 (SUTURE) ×2
SUT SILK 2 0SH CR/8 30 (SUTURE) ×3 IMPLANT
SUT SILK 2-0 18XBRD TIE 12 (SUTURE) ×1 IMPLANT
SUT SILK 2-0 18XBRD TIE BLK (SUTURE) ×1 IMPLANT
SUT SILK 3 0 (SUTURE) ×2
SUT SILK 3 0 TIES 17X18 (SUTURE) ×2
SUT SILK 3-0 18XBRD TIE 12 (SUTURE) ×1 IMPLANT
SUT SILK 3-0 18XBRD TIE BLK (SUTURE) ×1 IMPLANT
SUT VIC AB 2-0 CT1 27 (SUTURE) ×10
SUT VIC AB 2-0 CT1 TAPERPNT 27 (SUTURE) ×5 IMPLANT
SUT VIC AB 3-0 SH 27 (SUTURE) ×8
SUT VIC AB 3-0 SH 27X BRD (SUTURE) ×4 IMPLANT
SUT VICRYL 4-0 PS2 18IN ABS (SUTURE) ×12 IMPLANT
TAPE UMBILICAL COTTON 1/8X30 (MISCELLANEOUS) ×3 IMPLANT
TOWEL BLUE STERILE X RAY DET (MISCELLANEOUS) ×6 IMPLANT
TRAY FOLEY W/METER SILVER 16FR (SET/KITS/TRAYS/PACK) ×3 IMPLANT
WATER STERILE IRR 1000ML POUR (IV SOLUTION) ×6 IMPLANT

## 2015-05-30 NOTE — Progress Notes (Addendum)
  Day of Surgery Note    Subjective:  C/o pain  Filed Vitals:   05/30/15 1158  BP:   Pulse:   Temp: 98 F (36.7 C)  Resp:     Incisions:   Bilateral groins are soft without hematoma.  Midline incision is c/d/i Extremities:  Bilateral feet are warm with palpable DP pulses bilaterally Cardiac:  regular Lungs:  Non labored Abdomen:  Slightly distended  Assessment/Plan:  This is a 64 y.o. male who is s/p Aortobifemoral bypass graft using a 18 x 9 bifurcated dacryon graft  -pt doing well this afternoon with palpable pedal pulses -in process of starting PCA -to [place] when bed available.   Leontine Locket, PA-C 05/30/2015 12:38 PM  Pt now in ICU, comfortable, a little nausea Palpable pedal pulses incisions ok Family updated  Annamarie Major,

## 2015-05-30 NOTE — Anesthesia Postprocedure Evaluation (Signed)
  Anesthesia Post-op Note  Patient: Adam Luz Sr.  Procedure(s) Performed: Procedure(s): AORTOBIFEMORAL BYPASS GRAFT (Bilateral)  Patient Location: PACU  Anesthesia Type:General  Level of Consciousness: awake, alert  and oriented  Airway and Oxygen Therapy: Patient Spontanous Breathing  Post-op Pain: minimal  Post-op Assessment: Post-op Vital signs reviewed and Patient's Cardiovascular Status Stable              Post-op Vital Signs: Reviewed and stable  Last Vitals:  Filed Vitals:   05/30/15 1322  BP: 104/67  Pulse: 77  Temp:   Resp: 7    Complications: No apparent anesthesia complications

## 2015-05-30 NOTE — H&P (View-Only) (Signed)
Patient name: Adam Armistead Sr. MRN: [id number] DOB: 1951-05-31 Sex: male     Chief Complaint  Patient presents with  . Follow-up    1 week follow up, had studies done 05/02/2015.     HISTORY OF PRESENT ILLNESS: The patient is back for follow-up of his aneurysm.  He recently had a CT scan as well as multiple preoperative studies.  Past Medical History  Diagnosis Date  . Hypertension   . NSTEMI (non-ST elevated myocardial infarction). 05/28/12 05/28/2012  . HTN (hypertension) 05/28/2012  . Tobacco abuse 05/28/2012  . Thrombocytopenia 05/29/2012  . Peripheral arterial disease   . Abdominal aortic aneurysm   . Congestive heart failure   . Coronary artery disease     Past Surgical History  Procedure Laterality Date  . Hernia repair    . Coronary stent placement    . Left heart catheterization with coronary angiogram N/A 05/30/2012    Procedure: LEFT HEART CATHETERIZATION WITH CORONARY ANGIOGRAM;  Surgeon: Troy Sine, MD;  Location: [facility] CATH LAB;  Service: Cardiovascular;  Laterality: N/A;    Social History   Social History  . Marital Status: Married    Spouse Name: N/A  . Number of Children: 2  . Years of Education: N/A   Occupational History  . Engineer, building services    Social History Main Topics  . Smoking status: Former Smoker -- 1.00 packs/day for 45 years    Types: Cigarettes    Quit date: 07/08/2012  . Smokeless tobacco: Never Used  . Alcohol Use: 8.4 oz/week    14 Cans of beer per week     Comment: 6 pack weekly of beer  . Drug Use: No  . Sexual Activity: Not on file   Other Topics Concern  . Not on file   Social History Narrative    Family History  Problem Relation Age of Onset  . Coronary artery disease Mother   . Coronary artery disease Sister   . Heart disease Mother   . Heart attack Mother   . Peripheral vascular disease Mother   . AAA (abdominal aortic aneurysm) Mother     Allergies as of 05/06/2015 - Review Complete 05/06/2015  Allergen  Reaction Noted  . Codeine Itching and Rash 05/28/2012    Current Outpatient Prescriptions on File Prior to Visit  Medication Sig Dispense Refill  . aspirin 81 MG tablet Take 81 mg by mouth daily.    Marland Kitchen atorvastatin (LIPITOR) 40 MG tablet Take 1 tablet (40 mg total) by mouth daily at 6 PM. 30 tablet 6  . carvedilol (COREG) 12.5 MG tablet Take 1.5 tablets (18.75 mg total) by mouth 2 (two) times daily with a meal. 90 tablet 6  . lisinopril (PRINIVIL,ZESTRIL) 20 MG tablet Take 1 tablet (20 mg total) by mouth daily. 90 tablet 1  . psyllium (METAMUCIL) 58.6 % powder Take 1 packet by mouth daily.    . nitroGLYCERIN (NITROSTAT) 0.4 MG SL tablet Place 1 tablet (0.4 mg total) under the tongue every 5 (five) minutes as needed for chest pain (up to 3 tabs in 15 mins). 25 tablet 2   No current facility-administered medications on file prior to visit.     REVIEW OF SYSTEMS: No changes from prior exam 1 week ago  PHYSICAL EXAMINATION:   Vital signs are  Filed Vitals:   05/06/15 1520  BP: 165/92  Pulse: 71  Temp: 98 F (36.7 C)  TempSrc: Oral  Height: '5\' 10"'$  (1.778 m)  Weight:  136 lb 7 oz (61.888 kg)  SpO2: 100%   Body mass index is 19.58 kg/(m^2). General: The patient appears their stated age. HEENT:  No gross abnormalities Pulmonary:  Non labored breathing Abdomen: Soft and non-tender Musculoskeletal: There are no major deformities. Neurologic: No focal weakness or paresthesias are detected, Skin: There are no ulcer or rashes noted. Psychiatric: The patient has normal affect.   Diagnostic Studies have ordered and reviewed his carotid Doppler study which shows 40-59 percent bilateral stenosis.  No significant aneurysmal disease in the lower extremity.    I have reviewed his CT angiogram which has the following results:  .0 x 5.2 cm infrarenal abdominal aortic aneurysm. Recommend followup by abdomen and pelvis CTA in 3-6 months, and vascular surgery referral/consultation if not  already obtained. This recommendation follows ACR consensus guidelines: White Paper of the ACR Incidental Findings Committee II on Vascular Findings. J Am Coll Radiol 2013; 10:789-794.  No evidence of thoracic aortic aneurysm or dissection. Ectasia of the ascending thoracic aorta, measuring up to 3.6 cm.  4 mm posterior right upper lobe nodule. Given that patient is high risk for primary bronchogenic neoplasm, a single follow-up CT chest is suggested in 1 year   Assessment: Abdominal aortic aneurysm  Plan: I have reviewed the patient's CT scan with him.  There are several reasons that I think he is not a good candidate for endovascular repair.  First is the fact that he has a short, 12 mm infrarenal neck.  Second, he has bilateral high-grade external iliac disease which I think male create difficulty getting the device in place and will likely require subsequent stenting.  I told him I think the best option for him is to proceed with aortobifemoral bypass graft.  I discussed the risks and benefits of this operation including the risk of cardioplegic complications, wound complications intestinal ischemia lower extremity ischemia.  He wishes to proceed.  All his questions were answered today.  I spent in excess of 60 minutes discussing the patient's case with him and reviewing his imaging.  Greater than 50% of that was face-to-face.  I have scheduled his operation for Thursday, September 22.    Eldridge Abrahams, M.D. [facility] [place] Office: [phone] Pager:  [phone]

## 2015-05-30 NOTE — Anesthesia Preprocedure Evaluation (Addendum)
Anesthesia Evaluation  Patient identified by MRN, date of birth, ID band Patient awake    Reviewed: Allergy & Precautions, NPO status , Patient's Chart, lab work & pertinent test results  Airway Mallampati: II  TM Distance: >3 FB Neck ROM: Full    Dental  (+) Missing, Dental Advisory Given,    Pulmonary former smoker,    breath sounds clear to auscultation       Cardiovascular hypertension, Pt. on medications + CAD, + Past MI, + Peripheral Vascular Disease and +CHF   Rhythm:Regular Rate:Normal     Neuro/Psych negative neurological ROS  negative psych ROS   GI/Hepatic Neg liver ROS, hiatal hernia,   Endo/Other  diabetes  Renal/GU negative Renal ROS  negative genitourinary   Musculoskeletal negative musculoskeletal ROS (+)   Abdominal   Peds  Hematology negative hematology ROS (+)   Anesthesia Other Findings   Reproductive/Obstetrics negative OB ROS                          Lab Results  Component Value Date   WBC 3.5* 05/21/2015   HGB 12.6* 05/21/2015   HCT 38.0* 05/21/2015   MCV 97.9 05/21/2015   PLT 131* 05/21/2015   Lab Results  Component Value Date   CREATININE 1.33* 05/21/2015   BUN 13 05/21/2015   NA 131* 05/21/2015   K 5.0 05/21/2015   CL 101 05/21/2015   CO2 22 05/21/2015   Lab Results  Component Value Date   INR 1.07 05/21/2015   INR 0.94 05/30/2012   INR 1.00 05/28/2012   EKG: normal sinus rhythm.  03/2015: Nuc Med Study  Nuclear stress EF: 55%. The left ventricular ejection fraction is normal (55-65%).  Downsloping ST segment depression ST segment depression was noted during stress in the II, III, aVF, V5 and V6 leads, beginning at 1 minutes of stress, ending at 2 minutes of stress, and returning to baseline after 1-5 minutes of recovery.  The study is normal.  This is a low risk study.  Echo (2013) - Left ventricle: The cavity size was normal. Wall  thickness was increased in a pattern of mild LVH. There was mild concentric hypertrophy. Systolic function was normal. The estimated ejection fraction was in the range of 50% to 55%. Left ventricular diastolic function parameters were normal. - Aortic valve: Trileaflet; mildly thickened leaflets. There was no stenosis. No regurgitation. - Mitral valve: Mildly calcified annulus. Mildly thickened leaflets . Systolic bowing without prolapse. Trivial regurgitation. - Atrial septum: No defect or patent foramen ovale was identified. - Tricuspid valve: Mild regurgitation.  Anesthesia Physical Anesthesia Plan  ASA: III  Anesthesia Plan: General   Post-op Pain Management:    Induction: Intravenous  Airway Management Planned: Oral ETT  Additional Equipment: Arterial line  Intra-op Plan:   Post-operative Plan: Extubation in OR  Informed Consent: I have reviewed the patients History and Physical, chart, labs and discussed the procedure including the risks, benefits and alternatives for the proposed anesthesia with the patient or authorized representative who has indicated his/her understanding and acceptance.   Dental advisory given  Plan Discussed with: CRNA  Anesthesia Plan Comments:         Anesthesia Quick Evaluation

## 2015-05-30 NOTE — Interval H&P Note (Signed)
History and Physical Interval Note:  05/30/2015 7:25 AM  Adam Luz Sr.  has presented today for surgery, with the diagnosis of Abdominal aortic aneurysm I71.4  The various methods of treatment have been discussed with the patient and family. After consideration of risks, benefits and other options for treatment, the patient has consented to  Procedure(s): AORTOBIFEMORAL BYPASS GRAFT (Bilateral) as a surgical intervention .  The patient's history has been reviewed, patient examined, no change in status, stable for surgery.  I have reviewed the patient's chart and labs.  Questions were answered to the patient's satisfaction.     Annamarie Major

## 2015-05-30 NOTE — Op Note (Signed)
Patient name: Adam Lopez. MRN: [id number] DOB: November 01, 1950 Sex: male  05/30/2015 Pre-operative Diagnosis: #1: Abdominal aortic aneurysm.  #2: Bilateral claudication Post-operative diagnosis:  Same Surgeon:  Annamarie Major Assistants:  Victorino Dike Procedure:   Aortobifemoral bypass graft using a 18 x 9 bifurcated dacryon graft Anesthesia:  Gen. Blood Loss:  See anesthesia record Specimens:  None  Findings:  End to end  proximal anastomosis.  Bilateral femoral anastomosis first to the distal common femoral artery  Indications:  The patient was found to have a 5.1 cm infrarenal abdominal aortic aneurysm.  He also suffers from bilateral claudication.  Because of the significant disease within his external iliac arteries, I did not think he was a good candidate for endovascular repair and therefore recommended open repair via an aortobifemoral bypass graft.  Procedure:  The patient was identified in the holding area and taken to [facility] [address]  The patient was then placed supine on the table. general anesthesia was administered.  The patient was prepped and draped in the usual sterile fashion.  A time out was called and antibiotics were administered.  Bilateral longitudinal femoral incisions were made.  Using cautery and sharp dissection, bilateral common femoral arteries were dissected free.  The profunda and superficial femoral artery were also individually isolated.  The distal external iliac artery was mobilized.  The crossing circumflex iliac vein was divided bilaterally between silk ties.  Bilateral common femoral arteries had nonocclusive calcified plaque with multiple areas that were minimally diseased.  The groin incisions were then packed with wet Ray-Tec's.  A midline incision was made from the xiphoid down below the umbilicus.  The fascia was then divided with cautery and the abdomen entered sharply.  The fascia was opened throughout the length of the incision.  The abdomen was  inspected.  There was no gross pathology.  A Balfour was inserted.  The transverse colon was reflected cephalad and the small bowel was mobilized to the patient's right.  The ligament of Treitz was taken down sharply.  An Omni-Tract retractor was then used to aid with exposure.  The infrarenal abdominal aorta was exposed down to the bifurcation.  Bilateral common iliac arteries were individually isolated, protecting the iliac veins.  I then went to expose the infrarenal neck.  The inferior mesenteric vein was divided.  The infrarenal aortic neck was dissected free it was approximately 2 cm in length.  I dissected this out up to the level of the left renal artery which was the lowest renal artery.  I then created a tunnel between the abdominal incision to the groin incision, passing directly anterior to the iliac artery, posterior to the ureter.  The patient was fully heparinized.  After the heparin circulated, I occluded bilateral iliac arteries first using a Hanley clamp.  I then used a Harken clamp to occlude the infrarenal aorta.  A #11 blade was used to open the aneurysm.  Curved Mayo scissors were used to open the entire aneurysmal laminated thrombus was evacuated.  Lumbar arteries that were backbleeding were oversewn with silk suture ligatures.  I then transected bilateral common femoral arteries and oversewed them with 5-0 Prolene in 2 layers.  Next attention was turned towards the proximal anastomosis.  I selected a 18 x 9 bifurcated dacryon graft.  I transected the aorta at the aortic neck and performed a limited endarterectomy.  I was to perform a end to end proximal anastomosis.  I used a felt strip and placed 3  horizontal mattress back wall sutures.  I then used the lateral 2 sutures to complete the anastomosis going around the aorta and the graft, incorporating a felt strip.  This was then secured.  The clamp was then released.  There was good hemostasis.  I then used a 55 dacryon tube graft and  advanced this over the proximal anastomosis.  Both limbs of the graft were then brought through the respective tunnels.  Dr.  Simmering did the anastomosis in the left groin and identified the right.  The femoral arteries were occluded bilaterally with vascular clamps.  A #11 blade was used to make an arteriotomy bilaterally which was extended longitudinally beginning in the distal common femoral artery extending to the origin of the profunda femoral artery bilaterally.  Each limb of the graft was cut to the appropriate length.  A end to side anastomosis was then created with running 5-0 Prolene.  Prior to completion, the appropriate flushing maneuvers were performed and the anastomosis was completed.  Blood flow was sequentially reestablished each leg.  There were excellent Doppler signals within the fundus and superficial femoral artery.  Next, the patient was given 50 mg protamine.  Hemostasis was achieved in the abdomen.  The aneurysmal sac was closed over top of the graft with 20 Vicryls.  Retroperitoneum was closed with 20 Vicryls.  The abdominal contents were placed back into their anatomic location.  I inspected the small bowel was without defect.  A nature that there were no twists or kinks within the mesentery.  The fascia was then reapproximated with 2 running #1 PDS suture.  Subcutaneous tissues closed with 20 Vicryls skin was closed with 4 Vicryls.  Next attention was turned towards each groin.  These were irrigated.  Hemostasis was satisfactory.  The femoral sheath was reapproximated with 20 Vicryls.  The subcutaneous tissue was then closed with multiple layers of 2 and 30 Vicryls followed by 4 Vicryls on the skin.  Dermabond was applied.  The patient tolerated the procedure well there were no immediate complication.   Disposition:  To PACU in stable condition.   Theotis Burrow, M.D. [facility] [place] Office: [phone] Pager:  [phone]

## 2015-05-30 NOTE — Anesthesia Procedure Notes (Signed)
Procedure Name: Intubation Date/Time: 05/30/2015 7:46 AM Performed by: Suzy Bouchard Pre-anesthesia Checklist: Patient identified, Timeout performed, Emergency Drugs available, Suction available and Patient being monitored Patient Re-evaluated:Patient Re-evaluated prior to inductionOxygen Delivery Method: Circle system utilized Preoxygenation: Pre-oxygenation with 100% oxygen Intubation Type: IV induction Ventilation: Mask ventilation without difficulty Laryngoscope Size: Miller and 2 Grade View: Grade I Tube type: Oral Tube size: 7.5 mm Number of attempts: 1 Airway Equipment and Method: Stylet Placement Confirmation: ETT inserted through vocal cords under direct vision,  breath sounds checked- equal and bilateral and positive ETCO2 Secured at: 22 cm Tube secured with: Tape Dental Injury: Teeth and Oropharynx as per pre-operative assessment

## 2015-05-30 NOTE — Transfer of Care (Signed)
Immediate Anesthesia Transfer of Care Note  Patient: Adam Luz Sr.  Procedure(s) Performed: Procedure(s): AORTOBIFEMORAL BYPASS GRAFT (Bilateral)  Patient Location: PACU  Anesthesia Type:General  Level of Consciousness: sedated  Airway & Oxygen Therapy: Patient Spontanous Breathing and Patient connected to nasal cannula oxygen  Post-op Assessment: Report given to RN, Post -op Vital signs reviewed and stable and Patient moving all extremities  Post vital signs: Reviewed and stable  Last Vitals:  Filed Vitals:   05/30/15 0608  BP: 177/92  Pulse:   Temp:   Resp:     Complications: No apparent anesthesia complications

## 2015-05-31 ENCOUNTER — Inpatient Hospital Stay (HOSPITAL_COMMUNITY): Payer: BLUE CROSS/BLUE SHIELD

## 2015-05-31 ENCOUNTER — Encounter (HOSPITAL_COMMUNITY): Payer: Self-pay | Admitting: Surgery

## 2015-05-31 LAB — CBC
HCT: 26.1 % — ABNORMAL LOW (ref 39.0–52.0)
Hemoglobin: 9 g/dL — ABNORMAL LOW (ref 13.0–17.0)
MCH: 33.8 pg (ref 26.0–34.0)
MCHC: 34.5 g/dL (ref 30.0–36.0)
MCV: 98.1 fL (ref 78.0–100.0)
PLATELETS: 108 10*3/uL — AB (ref 150–400)
RBC: 2.66 MIL/uL — ABNORMAL LOW (ref 4.22–5.81)
RDW: 12.5 % (ref 11.5–15.5)
WBC: 8.8 10*3/uL (ref 4.0–10.5)

## 2015-05-31 LAB — COMPREHENSIVE METABOLIC PANEL
ALBUMIN: 2.9 g/dL — AB (ref 3.5–5.0)
ALT: 23 U/L (ref 17–63)
AST: 35 U/L (ref 15–41)
Alkaline Phosphatase: 42 U/L (ref 38–126)
Anion gap: 4 — ABNORMAL LOW (ref 5–15)
BUN: 16 mg/dL (ref 6–20)
CHLORIDE: 100 mmol/L — AB (ref 101–111)
CO2: 22 mmol/L (ref 22–32)
Calcium: 7.8 mg/dL — ABNORMAL LOW (ref 8.9–10.3)
Creatinine, Ser: 1.4 mg/dL — ABNORMAL HIGH (ref 0.61–1.24)
GFR calc Af Amer: 60 mL/min — ABNORMAL LOW (ref >60)
GFR, EST NON AFRICAN AMERICAN: 52 mL/min — AB (ref >60)
Glucose, Bld: 180 mg/dL — ABNORMAL HIGH (ref 65–99)
POTASSIUM: 4.8 mmol/L (ref 3.5–5.1)
Sodium: 126 mmol/L — ABNORMAL LOW (ref 135–145)
Total Bilirubin: 0.9 mg/dL (ref 0.3–1.2)
Total Protein: 5.2 g/dL — ABNORMAL LOW (ref 6.5–8.1)

## 2015-05-31 LAB — MAGNESIUM: MAGNESIUM: 2 mg/dL (ref 1.7–2.4)

## 2015-05-31 LAB — AMYLASE: AMYLASE: 40 U/L (ref 28–100)

## 2015-05-31 MED ORDER — SODIUM CHLORIDE 0.9 % IV SOLN
INTRAVENOUS | Status: DC
  Administered 2015-05-31 (×2): via INTRAVENOUS
  Administered 2015-06-01: 100 mL/h via INTRAVENOUS
  Administered 2015-06-01: 06:00:00 via INTRAVENOUS
  Administered 2015-06-01: 100 mL/h via INTRAVENOUS
  Administered 2015-06-02: 06:00:00 via INTRAVENOUS

## 2015-05-31 NOTE — Plan of Care (Signed)
Problem: Phase I Progression Outcomes Goal: Pain controlled with appropriate interventions Outcome: Not Progressing Patient c/o pain and not feeling relief with the current pain regimen. However, patient does not utilized PCA as much as he could, using 3 to 4.'5mg'$  every four hours on PCA. Demonstrated again on the use of PCA. Patient agreeable to use it.

## 2015-05-31 NOTE — Evaluation (Signed)
Occupational Therapy Evaluation Patient Details Name: Adam Dada Sr. MRN: [id number] DOB: 1951/07/07 Today's Date: 05/31/2015    History of Present Illness Adm 9/22 for AAA repair and aortobifemoral BPG PMHx- CHF, HTN, CAD, tobacco use   Clinical Impression   Patient presenting with deconditioning and decreased ADL, functional mobility independence secondary to above.  Patient independent PTA. Patient currently functioning at an overall supervision> min assist level. Patient will benefit from acute OT to increase overall independence in the areas of ADLs, functional mobility, and overall safety in order to safely discharge home with wife.     Follow Up Recommendations  No OT follow up;Supervision/Assistance - 24 hour (24/7 for first few days post acute discharge)    Equipment Recommendations  None recommended by OT (at this time)    Recommendations for Other Services  None at this time   Precautions / Restrictions Precautions Precautions: Fall Restrictions Weight Bearing Restrictions: No    Mobility Bed Mobility Overal bed mobility: Needs Assistance Bed Mobility: Rolling;Sidelying to Sit Rolling: Supervision Sidelying to sit: Supervision     Sit to sidelying: Min assist;+2 for safety/equipment General bed mobility comments: Assistance needed for lines/equipment during bed mobility, pt did not use bed rails and HOB flat. Pt holding onto stomach due to pain. Minimal dizziness upon sitting EOB, BP= WNL  Transfers Overall transfer level: Needs assistance Equipment used: None Transfers: Sit to/from [facility] Sit to Stand: Min guard Stand pivot transfers: Min guard   General transfer comment: Minimal dizzines upon standing and BP=WNL. Pt somewhat unsteady on feet, recommend RW for functional mobility and transfers at this time.     Balance Overall balance assessment: Needs assistance Sitting-balance support: No upper extremity supported;Feet  supported Sitting balance-Leahy Scale: Fair     Standing balance support: No upper extremity supported;During functional activity Standing balance-Leahy Scale: Poor    ADL Overall ADL's : Needs assistance/impaired General ADL Comments: Pt able to cross BLEs for LB ADLs, but with difficulty donning/doffing socks secondary to pain in abdomen. Encouraged pt to use [facility] for toileting needs, and to call for assistance. Pt engaged in bed mobility with supervision (no use of rails) and transferred EOB>recliner with min guard assistance.     Pertinent Vitals/Pain Pain Assessment: 0-10 Pain Score: 9  Pain Location: abdomen Pain Descriptors / Indicators: Guarding;Grimacing;Constant Pain Intervention(s): Monitored during session;Repositioned;PCA encouraged     Hand Dominance Right   Extremity/Trunk Assessment Upper Extremity Assessment Upper Extremity Assessment: Overall WFL for tasks assessed   Lower Extremity Assessment Lower Extremity Assessment: Overall WFL for tasks assessed   Cervical / Trunk Assessment Cervical / Trunk Assessment: Normal   Communication Communication Communication: No difficulties   Cognition Arousal/Alertness: Awake/alert Behavior During Therapy: WFL for tasks assessed/performed (agitation with getting up/down today) Overall Cognitive Status: Within Functional Limits for tasks assessed              Home Living Family/patient expects to be discharged to:: Private residence Living Arrangements: Spouse/significant other Available Help at Discharge: Family;Available PRN/intermittently Type of Home: House Home Access: Stairs to enter [employer] of Steps: 2 Entrance Stairs-Rails: Right;Left;Can reach both Home Layout: One level     Bathroom Shower/Tub: Tub/shower unit (pt reports sitting for baths)   Bathroom Toilet: Standard     Home Equipment: None   Prior Functioning/Environment Level of Independence: Independent     OT Diagnosis:  Generalized weakness;Acute pain   OT Problem List: Decreased strength;Decreased activity tolerance;Impaired balance (sitting and/or standing);Decreased safety awareness;Decreased knowledge of  use of DME or AE;Pain   OT Treatment/Interventions: Self-care/ADL training;Therapeutic exercise;Energy conservation;DME and/or AE instruction;Therapeutic activities;Patient/family education;Balance training    OT Goals(Current goals can be found in the care plan section) Acute Rehab OT Goals Patient Stated Goal: get to drink water OT Goal Formulation: With patient Time For Goal Achievement: 06/14/15 Potential to Achieve Goals: Good ADL Goals Pt Will Perform Grooming: standing;Independently Pt Will Perform Lower Body Bathing: sit to/from stand;Independently Pt Will Perform Lower Body Dressing: Independently;sit to/from stand Pt Will Transfer to Toilet: Independently;ambulating;regular height toilet Pt Will Perform Tub/Shower Transfer: Tub transfer;ambulating (sitting <> standing in tub) Additional ADL Goal #1: Pt will be independent with functional mobility to safely complete ADLs and IADLs at home  OT Frequency: Min 2X/week   Barriers to D/C: None known at this time, pt reports his wife is taking next week off from work   End of Session Nurse Communication: Mobility status;Other (comment) (pt up in chair)  Activity Tolerance: Patient tolerated treatment well Patient left: in chair;with call bell/phone within reach   Time: 1438-1501 OT Time Calculation (min): 23 min Charges:  OT General Charges $OT Visit: 1 Procedure OT Evaluation $Initial OT Evaluation Tier I: 1 Procedure OT Treatments $Therapeutic Activity: 8-22 mins  Melizza Kanode , MS, OTR/L, CLT Pager: [phone]  05/31/2015, 3:13 PM

## 2015-05-31 NOTE — Progress Notes (Signed)
21m of morphine PCA syringe wasted in sink. Witnessed by EWyline Beady RN.

## 2015-05-31 NOTE — Evaluation (Signed)
Physical Therapy Evaluation Patient Details Name: Adam Reger Sr. MRN: [id number] DOB: Aug 21, 1951 Today's Date: 05/31/2015   History of Present Illness  Adm 9/22 for AAA repair and aortobifemoral BPG PMHx- CHF, HTN, CAD, tobacco use  Clinical Impression  Patient is s/p above surgery resulting in functional limitations due to the deficits listed below (see PT Problem List). Pain and dizziness limited pt's ability to mobilize/walk and returned to bed. Anticipate good progress once these issues resolve. Encouraged pt to work on having someone with him when wife goes to work for at least first few days at home. Patient will benefit from skilled PT to increase their independence and safety with mobility to allow discharge to the venue listed below.       Follow Up Recommendations No PT follow up    Equipment Recommendations  Rolling walker with 5" wheels    Recommendations for Other Services OT consult     Precautions / Restrictions Precautions Precautions: Fall      Mobility  Bed Mobility Overal bed mobility: Needs Assistance;+ 2 for safety/equipment Bed Mobility: Rolling;Sidelying to Sit;Sit to Sidelying Rolling: Supervision Sidelying to sit: Min assist;+2 for safety/equipment     Sit to sidelying: Min assist;+2 for safety/equipment General bed mobility comments: vc for technique to minimize abd pain  Transfers Overall transfer level: Needs assistance Equipment used: Rolling walker (2 wheeled) Transfers: Sit to/from Stand Sit to Stand: Min assist;+2 physical assistance;+2 safety/equipment         General transfer comment: Stood well, almost immediately became dizzy; stood > 54mnute as he was trying to get to walk; legs began buckling and returned to sit EOB  Ambulation/Gait             General Gait Details: unable due to ?orthostasis vs pain medication effects  Stairs            Wheelchair Mobility    Modified Rankin (Stroke Patients Only)        Balance Overall balance assessment: Needs assistance Sitting-balance support: No upper extremity supported;Feet unsupported Sitting balance-Leahy Scale: Fair     Standing balance support: Bilateral upper extremity supported Standing balance-Leahy Scale: Poor                               Pertinent Vitals/Pain Pain Assessment: 0-10 Pain Score: 8  Pain Location: abd Pain Descriptors / Indicators: Constant;Operative site guarding Pain Intervention(s): Limited activity within patient's tolerance;Monitored during session;Premedicated before session;Repositioned;PCA encouraged    Home Living Family/patient expects to be discharged to:: Private residence Living Arrangements: Spouse/significant other Available Help at Discharge: Family;Available PRN/intermittently Type of Home: House Home Access: Stairs to enter Entrance Stairs-Rails: Right;Left;Can reach both Entrance Stairs-Number of Steps: 2 Home Layout: One level Home Equipment: None      Prior Function Level of Independence: Independent               Hand Dominance        Extremity/Trunk Assessment   Upper Extremity Assessment: Overall WFL for tasks assessed           Lower Extremity Assessment: Overall WFL for tasks assessed      Cervical / Trunk Assessment: Normal  Communication   Communication: No difficulties  Cognition Arousal/Alertness: Awake/alert Behavior During Therapy: Agitated (due to lack of pain control despite PCA) Overall Cognitive Status: Within Functional Limits for tasks assessed  General Comments      Exercises        Assessment/Plan    PT Assessment Patient needs continued PT services  PT Diagnosis Difficulty walking;Acute pain   PT Problem List Decreased activity tolerance;Decreased balance;Decreased mobility;Decreased knowledge of use of DME;Cardiopulmonary status limiting activity;Pain  PT Treatment Interventions DME  instruction;Gait training;Stair training;Functional mobility training;Therapeutic activities;Therapeutic exercise;Patient/family education   PT Goals (Current goals can be found in the Care Plan section) Acute Rehab PT Goals Patient Stated Goal: get to drink water PT Goal Formulation: With patient Time For Goal Achievement: 06/07/15 Potential to Achieve Goals: Good    Frequency Min 3X/week   Barriers to discharge Decreased caregiver support wife works and pt unsure he can arrange someone to be with him    Co-evaluation               End of Session   Activity Tolerance: Patient limited by pain;Treatment limited secondary to medical complications (Comment) (dizziness) Patient left: in bed;in CPM;with family/visitor present;with SCD's reapplied Nurse Communication: Mobility status         Time: 2761-4709 PT Time Calculation (min) (ACUTE ONLY): 30 min   Charges:   PT Evaluation $Initial PT Evaluation Tier I: 1 Procedure PT Treatments $Therapeutic Activity: 8-22 mins   PT G Codes:        Baldemar Dady 06/21/2015, 11:53 AM Pager [phone]

## 2015-05-31 NOTE — Significant Event (Signed)
Patient agreeable for ambulation and walked approximately 200 feet around unit with RN at 1615pm. Patient pushed wheelchair for gait support. Needed a lot of encouragement and safety measures cueing during ambulation. Was steady on his feet once up. Returned to room and in settled in bed per his requests.

## 2015-05-31 NOTE — Progress Notes (Signed)
  AAA Progress Note    05/31/2015 10:46 AM 1 Day Post-Op  Subjective:  C/o pain and pain medicine not working; not passing gas  Afebrile HR  60's-70's NSR 24'S-975'P systolic 005% 1TM2TR  Filed Vitals:   05/31/15 0815  BP:   Pulse:   Temp:   Resp: 16    Physical Exam: Cardiac:  Regular Lungs:  CTAB-better aeration at bases today Abdomen:  Slight distension; +occasional BS; -flatus Incisions:  Midline incision c/d/i with some ecchymosis; bilateral groins are soft, clean and dry Extremities:  Easily palpable 2+ DP bilaterally  CBC    Component Value Date/Time   WBC 8.8 05/31/2015 0422   RBC 2.66* 05/31/2015 0422   HGB 9.0* 05/31/2015 0422   HCT 26.1* 05/31/2015 0422   PLT 108* 05/31/2015 0422   MCV 98.1 05/31/2015 0422   MCH 33.8 05/31/2015 0422   MCHC 34.5 05/31/2015 0422   RDW 12.5 05/31/2015 0422   LYMPHSABS 0.4* 05/30/2012 0600   MONOABS 0.4 05/30/2012 0600   EOSABS 0.1 05/30/2012 0600   BASOSABS 0.0 05/30/2012 0600    BMET    Component Value Date/Time   NA 126* 05/31/2015 0422   K 4.8 05/31/2015 0422   CL 100* 05/31/2015 0422   CO2 22 05/31/2015 0422   GLUCOSE 180* 05/31/2015 0422   BUN 16 05/31/2015 0422   CREATININE 1.40* 05/31/2015 0422   CREATININE 1.37* 04/29/2015 0946   CALCIUM 7.8* 05/31/2015 0422   GFRNONAA 52* 05/31/2015 0422   GFRAA 60* 05/31/2015 0422    INR    Component Value Date/Time   INR 1.46 05/30/2015 1230     Intake/Output Summary (Last 24 hours) at 05/31/15 1046 Last data filed at 05/31/15 0800  Gross per 24 hour  Intake   2230 ml  Output   1375 ml  Net    855 ml     Assessment/Plan:  64 y.o. male is s/p  Aortobifemoral bypass graft using a 18 x 9 bifurcated dacryon graft 1 Day Post-Op  -pt with patent bypass graft with easily palpable DP pulses bilaterally -acute surgical blood loss anemia-tolerating -NGT with 500cc/24hr-keep for one more day -hyponatremia-change dextrose to saline (no K+ supplement-K+ is  4.8) -continue npo until bowel function improves -pain not well controlled, however, not using PCA as much as he could be.  Creatinine is at 1.4-hesitant to start Toradol  -encourage using PCA -continue to increase mobilzation   Leontine Locket, PA-C [facility] [phone] 05/31/2015 10:46 AM   Agree with the above.  I have seen and examined the patient.  Incisions look good.  Palpable pedal pulses  GI:  Likely d/c NG tomorrow depending on output.  Keep NPO except for ice chips until passing flatus GU:  Monitor creatinine, as he had slight increase today.  Likley remove foley tomorrow Acute blood loss anemia:  Stable no need for transfusion Monitor sodium Encourage coughing and IS OOB and walking daily   Wells Kiko Ripp

## 2015-06-01 MED ORDER — LORAZEPAM 2 MG/ML IJ SOLN
1.0000 mg | INTRAMUSCULAR | Status: DC | PRN
  Administered 2015-06-01 – 2015-06-04 (×5): 1 mg via INTRAVENOUS
  Filled 2015-06-01 (×5): qty 1

## 2015-06-01 MED ORDER — FOLIC ACID 1 MG PO TABS
1.0000 mg | ORAL_TABLET | Freq: Every day | ORAL | Status: DC
  Administered 2015-06-01 – 2015-06-06 (×5): 1 mg via ORAL
  Filled 2015-06-01 (×6): qty 1

## 2015-06-01 MED ORDER — ADULT MULTIVITAMIN W/MINERALS CH
1.0000 | ORAL_TABLET | Freq: Every day | ORAL | Status: DC
  Administered 2015-06-01 – 2015-06-06 (×5): 1 via ORAL
  Filled 2015-06-01 (×6): qty 1

## 2015-06-01 MED ORDER — HYDRALAZINE HCL 20 MG/ML IJ SOLN
10.0000 mg | INTRAMUSCULAR | Status: AC | PRN
  Administered 2015-06-02: 10 mg via INTRAVENOUS
  Filled 2015-06-01: qty 1

## 2015-06-01 MED ORDER — LORAZEPAM 1 MG PO TABS: 1.0000 mg | ORAL_TABLET | Freq: Four times a day (QID) | ORAL | Status: DC | PRN

## 2015-06-01 MED ORDER — THIAMINE HCL 100 MG/ML IJ SOLN
100.0000 mg | Freq: Every day | INTRAMUSCULAR | Status: DC
  Administered 2015-06-02 – 2015-06-03 (×2): 100 mg via INTRAVENOUS
  Filled 2015-06-01 (×5): qty 1

## 2015-06-01 MED ORDER — VITAMIN B-1 100 MG PO TABS
100.0000 mg | ORAL_TABLET | Freq: Every day | ORAL | Status: DC
  Administered 2015-06-01 – 2015-06-06 (×5): 100 mg via ORAL
  Filled 2015-06-01 (×6): qty 1

## 2015-06-01 MED ORDER — LORAZEPAM 2 MG/ML IJ SOLN
1.0000 mg | INTRAMUSCULAR | Status: DC | PRN
  Administered 2015-06-01: 1 mg via INTRAVENOUS
  Filled 2015-06-01: qty 1

## 2015-06-01 MED ORDER — LORAZEPAM 2 MG/ML IJ SOLN
1.0000 mg | Freq: Four times a day (QID) | INTRAMUSCULAR | Status: DC | PRN
  Administered 2015-06-01 (×2): 1 mg via INTRAVENOUS
  Filled 2015-06-01 (×2): qty 1

## 2015-06-01 MED ORDER — LORAZEPAM 1 MG PO TABS
1.0000 mg | ORAL_TABLET | ORAL | Status: DC | PRN
  Administered 2015-06-03: 1 mg via ORAL
  Filled 2015-06-01: qty 1

## 2015-06-01 NOTE — Progress Notes (Signed)
Patient having increased agitation with increase in HR and BP. Dr. Bridgett Larsson paged. New orders placed. See EMR. Adam Lopez

## 2015-06-01 NOTE — Progress Notes (Signed)
MD paged regarding new onset confusion and hallucinations. Patient trying to remove equipment and asking how to leave unit. 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   When asked about alcohol use patient stated that he drank a case of beer a weak but was had stopped one to two weeks ago.   MD orders received.

## 2015-06-01 NOTE — Progress Notes (Signed)
Patient PCA alarming syringe empty.  Upon changing syringe 3 mL morphine noted to be left in syringe.  Wasted in sink with Rachel Moulds, Therapist, sports.

## 2015-06-01 NOTE — Progress Notes (Signed)
Physical Therapy Treatment Patient Details Name: Adam Kratochvil Sr. MRN: [id number] DOB: 01-30-1951 Today's Date: 06/01/2015    History of Present Illness Adm 9/22 for AAA repair and aortobifemoral BPG PMHx- CHF, HTN, CAD, tobacco use    PT Comments    Progressing well now that dizziness not a factor.  Gait still a little unsteady and moderate use of the RW  Follow Up Recommendations  No PT follow up     Equipment Recommendations  Rolling walker with 5" wheels    Recommendations for Other Services       Precautions / Restrictions Precautions Precautions: Fall Restrictions Weight Bearing Restrictions: No    Mobility  Bed Mobility Overal bed mobility: Needs Assistance Bed Mobility: Rolling;Sidelying to Sit Rolling: Supervision Sidelying to sit: Min assist       General bed mobility comments: cues for initiation and sequencing  Transfers Overall transfer level: Needs assistance Equipment used: None Transfers: Sit to/from Stand Sit to Stand: Min guard;Min assist            Ambulation/Gait Ambulation/Gait assistance: Min guard Ambulation Distance (Feet): 100 Feet (then additional 200) Assistive device:  (pushing w/c) Gait Pattern/deviations: Step-through pattern;Trunk flexed Gait velocity: moderate Gait velocity interpretation: Below normal speed for age/gender General Gait Details: moderately heavy use of the W/C, flexed posture, some wandering/mild scissoring at times.   Stairs            Wheelchair Mobility    Modified Rankin (Stroke Patients Only)       Balance Overall balance assessment: Needs assistance Sitting-balance support: No upper extremity supported Sitting balance-Leahy Scale: Fair     Standing balance support: No upper extremity supported;Single extremity supported Standing balance-Leahy Scale: Fair                      Cognition Arousal/Alertness: Awake/alert Behavior During Therapy: WFL for tasks  assessed/performed Overall Cognitive Status: Within Functional Limits for tasks assessed                      Exercises      General Comments General comments (skin integrity, edema, etc.): SpO2 94-98% and EHR up to as high as 132 bpm sustained      Pertinent Vitals/Pain Pain Assessment: Faces Faces Pain Scale: Hurts even more Pain Location: abdomen Pain Descriptors / Indicators: Constant;Guarding;Grimacing Pain Intervention(s): Premedicated before session;Monitored during session    Home Living                      Prior Function            PT Goals (current goals can now be found in the care plan section) Acute Rehab PT Goals Patient Stated Goal: get to drink water PT Goal Formulation: With patient Time For Goal Achievement: 06/07/15 Potential to Achieve Goals: Good Progress towards PT goals: Progressing toward goals    Frequency  Min 3X/week    PT Plan Current plan remains appropriate    Co-evaluation             End of Session   Activity Tolerance: Patient tolerated treatment well;Patient limited by fatigue Patient left: with call bell/phone within reach;in chair     Time: 4268-3419 PT Time Calculation (min) (ACUTE ONLY): 21 min  Charges:  $Gait Training: 8-22 mins                    G Codes:      Adam Lopez, Adam Lopez  V 06/01/2015, 11:00 AM 06/01/2015  Donnella Sham, [place] [phone]  (pager)

## 2015-06-01 NOTE — Progress Notes (Signed)
   Daily Progress Note  Assessment/Planning: POD #2 s/p ABF, Protein malnutrition, Alcohol withdrawl   Sx of alcohol withdrawal overnight: CIWA protocol started  No return of bowel function yet, NGT 500 cc so continue NGT  Banana bag  Cont NS for now  Watch hyponatremia for now: can't water restrict, no evidence of SIADH  Stay in SICU  Subjective  - 2 Days Post-Op  Confused overnight with sx of EtOH withdrawl  Objective Filed Vitals:   06/01/15 0600 06/01/15 0700 06/01/15 0800 06/01/15 0803  BP: 144/81 161/84 170/98   Pulse: 96 100 115   Temp:    99 F (37.2 C)  TempSrc:    Oral  Resp: '11 15 17   '$ Height:      Weight: 144 lb 10 oz (65.6 kg)     SpO2: 97% 95% 98%     Intake/Output Summary (Last 24 hours) at 06/01/15 0936 Last data filed at 06/01/15 0800  Gross per 24 hour  Intake 2616.67 ml  Output   1845 ml  Net 771.67 ml   NEURO somewhat responsive with moments of confusion PULM  BLL rales CV  Tachy, RR GI  soft, inc c/d/i, appropriate TTP, -G/R VASC  B feet with palpable DP, both groin inc c/d/i  Laboratory CBC    Component Value Date/Time   WBC 8.8 05/31/2015 0422   HGB 9.0* 05/31/2015 0422   HCT 26.1* 05/31/2015 0422   PLT 108* 05/31/2015 0422    BMET    Component Value Date/Time   NA 126* 05/31/2015 0422   K 4.8 05/31/2015 0422   CL 100* 05/31/2015 0422   CO2 22 05/31/2015 0422   GLUCOSE 180* 05/31/2015 0422   BUN 16 05/31/2015 0422   CREATININE 1.40* 05/31/2015 0422   CREATININE 1.37* 04/29/2015 0946   CALCIUM 7.8* 05/31/2015 0422   GFRNONAA 52* 05/31/2015 0422   GFRAA 60* 05/31/2015 0422    Adele Barthel, MD [facility] [place]: [phone] Pager: [phone]  06/01/2015, 9:36 AM

## 2015-06-02 LAB — BASIC METABOLIC PANEL
ANION GAP: 8 (ref 5–15)
BUN: 12 mg/dL (ref 6–20)
CALCIUM: 8.4 mg/dL — AB (ref 8.9–10.3)
CO2: 22 mmol/L (ref 22–32)
Chloride: 105 mmol/L (ref 101–111)
Creatinine, Ser: 1.11 mg/dL (ref 0.61–1.24)
GLUCOSE: 88 mg/dL (ref 65–99)
POTASSIUM: 4.1 mmol/L (ref 3.5–5.1)
Sodium: 135 mmol/L (ref 135–145)

## 2015-06-02 LAB — CBC
HEMATOCRIT: 30.5 % — AB (ref 39.0–52.0)
HEMOGLOBIN: 10.2 g/dL — AB (ref 13.0–17.0)
MCH: 33.8 pg (ref 26.0–34.0)
MCHC: 33.4 g/dL (ref 30.0–36.0)
MCV: 101 fL — ABNORMAL HIGH (ref 78.0–100.0)
Platelets: 119 10*3/uL — ABNORMAL LOW (ref 150–400)
RBC: 3.02 MIL/uL — AB (ref 4.22–5.81)
RDW: 12.6 % (ref 11.5–15.5)
WBC: 9.3 10*3/uL (ref 4.0–10.5)

## 2015-06-02 MED ORDER — HYDRALAZINE HCL 20 MG/ML IJ SOLN
10.0000 mg | INTRAMUSCULAR | Status: DC | PRN
  Administered 2015-06-02 – 2015-06-04 (×6): 10 mg via INTRAVENOUS
  Filled 2015-06-02 (×6): qty 1

## 2015-06-02 MED ORDER — LEVALBUTEROL HCL 0.63 MG/3ML IN NEBU
INHALATION_SOLUTION | RESPIRATORY_TRACT | Status: AC
  Filled 2015-06-02: qty 3

## 2015-06-02 MED ORDER — DEXTROSE-NACL 5-0.45 % IV SOLN
INTRAVENOUS | Status: DC
  Administered 2015-06-02 – 2015-06-03 (×2): via INTRAVENOUS
  Administered 2015-06-03: 100 mL via INTRAVENOUS
  Administered 2015-06-03 – 2015-06-04 (×2): via INTRAVENOUS

## 2015-06-02 MED ORDER — LEVALBUTEROL HCL 0.63 MG/3ML IN NEBU
0.6300 mg | INHALATION_SOLUTION | Freq: Three times a day (TID) | RESPIRATORY_TRACT | Status: DC
  Administered 2015-06-02 – 2015-06-04 (×6): 0.63 mg via RESPIRATORY_TRACT
  Filled 2015-06-02 (×11): qty 3

## 2015-06-02 MED ORDER — THIAMINE HCL 100 MG/ML IJ SOLN
Freq: Once | INTRAVENOUS | Status: AC
  Administered 2015-06-02: 08:00:00 via INTRAVENOUS
  Filled 2015-06-02: qty 1000

## 2015-06-02 MED ORDER — METOPROLOL TARTRATE 1 MG/ML IV SOLN: 2.5000 mg | Freq: Four times a day (QID) | INTRAVENOUS | Status: DC | PRN

## 2015-06-02 NOTE — Progress Notes (Signed)
   Daily Progress Note  Assessment/Planning: POD #3 s/p ABF   Worsening EtOH withdrawal sx: requiring restraints to avoid injury  D/C foley and NGT: pt likely to d/c both anyway  Keep SICU  CIWA protocol  Switch to MIVF  Subjective  - 3 Days Post-Op  Confused, worse overnight  Objective Filed Vitals:   06/02/15 0600 06/02/15 0700 06/02/15 0732 06/02/15 0753  BP: 156/92 154/87    Pulse: 121 124    Temp:    98.2 F (36.8 C)  TempSrc:    Axillary  Resp: '28 28 31   '$ Height:      Weight:      SpO2: 98% 98% 98%     Intake/Output Summary (Last 24 hours) at 06/02/15 0758 Last data filed at 06/02/15 0700  Gross per 24 hour  Intake   2790 ml  Output   2220 ml  Net    570 ml    PULM  CTAB CV  RRR GI  soft, appropriate TTP- ,G/R, +BS, NGT 750 VASC  Groin incision c/d/i, Palpable pulses  Laboratory CBC    Component Value Date/Time   WBC 9.3 06/02/2015 0446   HGB 10.2* 06/02/2015 0446   HCT 30.5* 06/02/2015 0446   PLT 119* 06/02/2015 0446    BMET    Component Value Date/Time   NA 135 06/02/2015 0446   K 4.1 06/02/2015 0446   CL 105 06/02/2015 0446   CO2 22 06/02/2015 0446   GLUCOSE 88 06/02/2015 0446   BUN 12 06/02/2015 0446   CREATININE 1.11 06/02/2015 0446   CREATININE 1.37* 04/29/2015 0946   CALCIUM 8.4* 06/02/2015 0446   GFRNONAA >60 06/02/2015 0446   GFRAA >60 06/02/2015 0446    Adele Barthel, MD [facility] [place]: [phone] Pager: [phone]  06/02/2015, 7:58 AM

## 2015-06-03 MED ORDER — DIPHENHYDRAMINE HCL 25 MG PO CAPS
25.0000 mg | ORAL_CAPSULE | Freq: Four times a day (QID) | ORAL | Status: DC | PRN
  Administered 2015-06-03: 25 mg via ORAL
  Filled 2015-06-03: qty 1

## 2015-06-03 MED ORDER — CETYLPYRIDINIUM CHLORIDE 0.05 % MT LIQD
7.0000 mL | Freq: Two times a day (BID) | OROMUCOSAL | Status: DC
  Administered 2015-06-03 – 2015-06-06 (×5): 7 mL via OROMUCOSAL

## 2015-06-03 MED FILL — Sodium Chloride IV Soln 0.9%: INTRAVENOUS | Qty: 1000 | Status: AC

## 2015-06-03 MED FILL — Heparin Sodium (Porcine) Inj 1000 Unit/ML: INTRAMUSCULAR | Qty: 30 | Status: AC

## 2015-06-03 NOTE — Progress Notes (Signed)
PT Cancellation Note  Patient Details Name: Adam Walkowski Sr. MRN: [id number] DOB: 1950-10-17   Cancelled Treatment:    Reason Eval/Treat Not Completed: Patient not medically ready. When PT arrived, RN present in room and reports that pt pulled out central line. He will now be on bedrest for the next 30 minutes. Will check back as schedule allows to complete treatment session.    Rolinda Roan 06/03/2015, 2:11 PM   Rolinda Roan, PT, DPT Acute Rehabilitation Services Pager: [phone]

## 2015-06-03 NOTE — Progress Notes (Addendum)
  [facility] Progress Note  Subjective  - POD #4  Denies any pain. Overnight was combative per nursing staff. Denies nausea. Passing flatus.   Objective Filed Vitals:   06/03/15 0830  BP: 138/69  Pulse: 102  Temp:   Resp: 25    Intake/Output Summary (Last 24 hours) at 06/03/15 0917 Last data filed at 06/03/15 0800  Gross per 24 hour  Intake   2275 ml  Output    915 ml  Net   1360 ml   Abdomen soft and non tender.  Incisions c/d/i Palpable pedal pulses.   Assessment/Planning: 64 y.o. male is s/p: aortobifemoral bypass 4 Days Post-Op   Start clear liquids today.  Continue CIWA protocol. Transfer to SDU.   Adam Lopez 06/03/2015 9:17 AM --  Laboratory CBC    Component Value Date/Time   WBC 9.3 06/02/2015 0446   HGB 10.2* 06/02/2015 0446   HCT 30.5* 06/02/2015 0446   PLT 119* 06/02/2015 0446    BMET    Component Value Date/Time   NA 135 06/02/2015 0446   K 4.1 06/02/2015 0446   CL 105 06/02/2015 0446   CO2 22 06/02/2015 0446   GLUCOSE 88 06/02/2015 0446   BUN 12 06/02/2015 0446   CREATININE 1.11 06/02/2015 0446   CREATININE 1.37* 04/29/2015 0946   CALCIUM 8.4* 06/02/2015 0446   GFRNONAA >60 06/02/2015 0446   GFRAA >60 06/02/2015 0446    COAG Lab Results  Component Value Date   INR 1.46 05/30/2015   INR 1.07 05/21/2015   INR 0.94 05/30/2012   No results found for: PTT  Antibiotics Anti-infectives    Start     Dose/Rate Route Frequency Ordered Stop   05/30/15 1800  cefUROXime (ZINACEF) 1.5 g in dextrose 5 % 50 mL IVPB     1.5 g 100 mL/hr over 30 Minutes Intravenous Every 12 hours 05/30/15 1400 05/31/15 0708   05/30/15 0548  cefUROXime (ZINACEF) 1.5 g in dextrose 5 % 50 mL IVPB     1.5 g 100 mL/hr over 30 Minutes Intravenous 30 min pre-op 05/30/15 0548 05/30/15 [place], PA-C [facility] Office: [phone] Pager: [phone] 06/03/2015 9:17 AM    I agree with the above.   I have seen and evaluated the patient. It was felt that he was withdrawing from alcohol over the weekend and therefore CIWA protocol was initiated.    incisions: All are healing appropriately  Pulses: He has palpable pedal pulses GI: We will advance his diet to clear liquids  GU: He had to have his Foley replaced secondary to retention.  We'll attempt to remove it tomorrow.  Neuro: He appears more clear today however he is still only oriented to person.  I do not think there is a need to get a CT scan to look for stroke, however we will continue to monitor his progress.  Prophylaxis: Lovenox and Protonix  Gen.: PT and OT to assist.  We'll transfer to stepdown unit  GU: postoperative renal insufficiency has resolved as his creatinine is back to baseline.  Pulmonary: Continue to work on pulmonary toilet. He  Require nasotracheal last night.  I encouraged him to continue with coughing.   Annamarie Major

## 2015-06-03 NOTE — Progress Notes (Signed)
Occupational Therapy Treatment Patient Details Name: Adam Caples Sr. MRN: [id number] DOB: 10/21/1950 Today's Date: 06/03/2015    History of present illness Admitted 9/22 for AAA repair and aortobifemoral BPG PMHx- CHF, HTN, CAD, tobacco use   OT comments  Pt progressing. Pt with apparent decreased cognition in session today.  Follow Up Recommendations  No OT follow up;Supervision/Assistance - 24 hour    Equipment Recommendations  None recommended by OT    Recommendations for Other Services      Precautions / Restrictions Precautions Precautions: Fall Restrictions Weight Bearing Restrictions: No       Mobility Bed Mobility Overal bed mobility: Needs Assistance Bed Mobility: Supine to Sit     Supine to sit: Min assist     General bed mobility comments: cues required  Transfers Overall transfer level: Needs assistance   Transfers: Sit to/from Stand Sit to Stand: Min guard         General transfer comment: RW in front of pt for support.    Balance    Used RW for ambulation.                                ADL Overall ADL's : Needs assistance/impaired     Grooming: Applying deodorant;Wash/dry face;Min guard;Sitting;Standing   Upper Body Bathing: Minimal assitance;Sitting   Lower Body Bathing: Min guard (standing-washed bottom)       Lower Body Dressing: Minimal assistance;Sitting/lateral leans Lower Body Dressing Details (indicate cue type and reason): donned/doffed socks; cue for technique Toilet Transfer: Min guard;Ambulation;RW (sit to stand from bed and chair)           Functional mobility during ADLs: Min guard;Rolling walker   Comments: Educated on how to call nurse.        Vision                     Perception     Praxis      Cognition  Awake/Alert Behavior During Therapy: WFL for tasks assessed/performed Overall Cognitive Status: No family/caregiver present to determine baseline cognitive  functioning Area of Impairment: Following commands;Problem solving;Orientation;Safety/judgement Orientation Level: Disoriented to;Place (at beginning of session)      Following Commands: Follows one step commands inconsistently Safety/Judgement: Decreased awareness of safety   Problem Solving: Slow processing;Requires verbal cues General Comments: pt asking about hole in the floor?    Extremity/Trunk Assessment               Exercises     Shoulder Instructions       General Comments      Pertinent Vitals/ Pain       Pain Assessment: Faces Faces Pain Scale: Hurts little more Pain Location: abdomen Pain Descriptors / Indicators:  (holding it) Pain Intervention(s): Monitored during session   Monitor reading pt's O2 dropping in 60s-70s (trended up) on RA but unsure of accuracy, and read HR up to 120s.  Home Living                                          Prior Functioning/Environment              Frequency Min 2X/week     Progress Toward Goals  OT Goals(current goals can now be found in the care plan section)  Progress towards OT goals: Progressing toward  goals  Acute Rehab OT Goals Patient Stated Goal: not stated OT Goal Formulation: With patient Time For Goal Achievement: 06/14/15 Potential to Achieve Goals: Good ADL Goals Pt Will Perform Grooming: standing;Independently Pt Will Perform Lower Body Bathing: sit to/from stand;Independently Pt Will Perform Lower Body Dressing: Independently;sit to/from stand Pt Will Transfer to Toilet: Independently;ambulating;regular height toilet Pt Will Perform Tub/Shower Transfer: Tub transfer;ambulating (sitting <> standing in tub) Additional ADL Goal #1: Pt will be independent with functional mobility to safely complete ADLs and IADLs at home  Plan Discharge plan remains appropriate    Co-evaluation                 End of Session Equipment Utilized During Treatment: Gait belt;Rolling  walker   Activity Tolerance Patient tolerated treatment well   Patient Left in chair;with call bell/phone within reach;with chair alarm set   Nurse Communication Mobility status;Other (comment) (asking hole in the floor)        Time: 0177-9390 OT Time Calculation (min): 21 min  Charges: OT General Charges $OT Visit: 1 Procedure OT Treatments $Self Care/Home Management : 8-22 mins  Benito Mccreedy OTR/L [phone] 06/03/2015, 5:08 PM

## 2015-06-04 MED ORDER — LEVALBUTEROL HCL 0.63 MG/3ML IN NEBU
0.6300 mg | INHALATION_SOLUTION | Freq: Three times a day (TID) | RESPIRATORY_TRACT | Status: DC
  Administered 2015-06-04 – 2015-06-05 (×4): 0.63 mg via RESPIRATORY_TRACT
  Filled 2015-06-04 (×9): qty 3

## 2015-06-04 MED ORDER — LEVALBUTEROL HCL 0.63 MG/3ML IN NEBU: 0.6300 mg | INHALATION_SOLUTION | Freq: Three times a day (TID) | RESPIRATORY_TRACT | Status: DC

## 2015-06-04 MED ORDER — CARVEDILOL 6.25 MG PO TABS
18.7500 mg | ORAL_TABLET | Freq: Two times a day (BID) | ORAL | Status: DC
  Administered 2015-06-04 – 2015-06-06 (×4): 18.75 mg via ORAL
  Filled 2015-06-04 (×6): qty 1

## 2015-06-04 MED ORDER — LISINOPRIL 20 MG PO TABS
20.0000 mg | ORAL_TABLET | Freq: Every day | ORAL | Status: DC
  Administered 2015-06-05 – 2015-06-06 (×2): 20 mg via ORAL
  Filled 2015-06-04 (×3): qty 1

## 2015-06-04 MED ORDER — ATORVASTATIN CALCIUM 40 MG PO TABS
40.0000 mg | ORAL_TABLET | Freq: Every day | ORAL | Status: DC
  Administered 2015-06-04 – 2015-06-05 (×2): 40 mg via ORAL
  Filled 2015-06-04 (×3): qty 1

## 2015-06-04 MED ORDER — DEXTROSE-NACL 5-0.45 % IV SOLN: INTRAVENOUS | Status: DC

## 2015-06-04 MED ORDER — LEVALBUTEROL HCL 0.63 MG/3ML IN NEBU
0.6300 mg | INHALATION_SOLUTION | Freq: Four times a day (QID) | RESPIRATORY_TRACT | Status: DC | PRN
  Administered 2015-06-04 – 2015-06-05 (×3): 0.63 mg via RESPIRATORY_TRACT
  Filled 2015-06-04: qty 3

## 2015-06-04 MED ORDER — ASPIRIN EC 81 MG PO TBEC
81.0000 mg | DELAYED_RELEASE_TABLET | Freq: Every day | ORAL | Status: DC
  Administered 2015-06-04 – 2015-06-06 (×3): 81 mg via ORAL
  Filled 2015-06-04 (×3): qty 1

## 2015-06-04 MED ORDER — LORAZEPAM 2 MG/ML IJ SOLN
2.0000 mg | INTRAMUSCULAR | Status: DC | PRN
  Administered 2015-06-04: 2 mg via INTRAVENOUS
  Filled 2015-06-04: qty 1

## 2015-06-04 NOTE — Progress Notes (Signed)
Physical Therapy Treatment Patient Details Name: Adam Necaise Sr. MRN: [id number] DOB: Oct 11, 1950 Today's Date: 06/04/2015    History of Present Illness Adm 9/22 for AAA repair and aortobifemoral BPG PMHx- CHF, HTN, CAD, tobacco use    PT Comments    Patient requiring less assistance to mobilize, however activity limited by tachypnea (RR 22 at rest, up to 38 with short distance ambulation and +?wheezing). RN made aware and contacting MD. SaO2 on room air 100% throughout session. Attempted to relax pt and assist him to slow his breathing down with minimal effect.    Follow Up Recommendations  No PT follow up     Equipment Recommendations  Rolling walker with 5" wheels    Recommendations for Other Services       Precautions / Restrictions Precautions Precautions: Fall    Mobility  Bed Mobility Overal bed mobility: Needs Assistance Bed Mobility: Rolling;Sidelying to Sit Rolling: Supervision Sidelying to sit: Min assist;Supervision       General bed mobility comments: cues for initiation only  Transfers Overall transfer level: Needs assistance Equipment used: Rolling walker (2 wheeled) Transfers: Sit to/from Stand Sit to Stand: Min assist         General transfer comment: vc for safe use of RW  Ambulation/Gait Ambulation/Gait assistance: Min assist Ambulation Distance (Feet): 30 Feet Assistive device: Rolling walker (2 wheeled) (pushing w/c) Gait Pattern/deviations: Step-through pattern;Decreased stride length;Trunk flexed Gait velocity: slow   General Gait Details: vc for position within RW and assist with turing RW; pt with incr tachpnea and limited ambulation   Stairs            Wheelchair Mobility    Modified Rankin (Stroke Patients Only)       Balance     Sitting balance-Leahy Scale: Fair       Standing balance-Leahy Scale: Fair                      Cognition Arousal/Alertness: Awake/alert Behavior During Therapy: WFL for  tasks assessed/performed;Flat affect Overall Cognitive Status: Within Functional Limits for tasks assessed (oriented, cooperative)                      Exercises      General Comments General comments (skin integrity, edema, etc.): groggy, but appropriate      Pertinent Vitals/Pain Pain Assessment: Faces Faces Pain Scale: No hurt    Home Living                      Prior Function            PT Goals (current goals can now be found in the care plan section) Acute Rehab PT Goals Patient Stated Goal: get to drink water Time For Goal Achievement: 06/07/15 Progress towards PT goals: Not progressing toward goals - comment    Frequency  Min 3X/week    PT Plan Current plan remains appropriate    Co-evaluation             End of Session Equipment Utilized During Treatment: Gait belt Activity Tolerance: Treatment limited secondary to medical complications (Comment) (wheezing, tachypnea) Patient left: with call bell/phone within reach;in chair;with chair alarm set     Time: 1950-9326 PT Time Calculation (min) (ACUTE ONLY): 19 min  Charges:  $Therapeutic Activity: 8-22 mins                    G Codes:  SASSER,LYNN 06/04/2015, 4:48 PM Pager [phone]

## 2015-06-04 NOTE — Progress Notes (Signed)
Pt transferred to [address]South via wheelchair to room 6 compression stockings with  Whitney to resume care

## 2015-06-04 NOTE — Progress Notes (Signed)
Called for bed transfer nurse not available

## 2015-06-04 NOTE — Plan of Care (Signed)
Patient constantly trying to get up. Reminded to stay in bed. Reminded that he is in the hospital. Bed alarm on. Sitter requested. Will continue to assess.

## 2015-06-04 NOTE — Progress Notes (Signed)
Report called [employer]

## 2015-06-04 NOTE — Progress Notes (Signed)
  [facility] Progress Note  Subjective  - POD #5  Wants to go home. Had BM yesterday. Passing flatus and tolerating clears. Apparently has been trying to get out of bed and pulled out central line yesterday.   Objective Filed Vitals:   06/04/15 0900  BP: 170/87  Pulse: 105  Temp:   Resp: 21    Intake/Output Summary (Last 24 hours) at 06/04/15 1008 Last data filed at 06/04/15 0900  Gross per 24 hour  Intake   2715 ml  Output    720 ml  Net   1995 ml   General: oriented to person, place, time and situation CV: tachy, regular rhythm Lungs: coarse breath sounds anterior  Abdomen: soft non-tender, normoactive bowel sounds Extremities: palpable dorsalis pedis pulses bilaterally    Assessment/Planning: 64 y.o. male is s/p: aortobifemoral bypass 5 Days Post-Op   More alert and oriented today.  Tolerating clears. Had BM and passing flatus. Advance to regular diet today.  Continue bowel regimen. Ambulate Encourage IS Continue CIWA protocol. D/c foley Bed unavailable in SDU yesterday. To transfer to 3S this morning.  Lovenox for DVT prophylaxis.   Alvia Grove 06/04/2015 10:08 AM --  Laboratory CBC    Component Value Date/Time   WBC 9.3 06/02/2015 0446   HGB 10.2* 06/02/2015 0446   HCT 30.5* 06/02/2015 0446   PLT 119* 06/02/2015 0446    BMET    Component Value Date/Time   NA 135 06/02/2015 0446   K 4.1 06/02/2015 0446   CL 105 06/02/2015 0446   CO2 22 06/02/2015 0446   GLUCOSE 88 06/02/2015 0446   BUN 12 06/02/2015 0446   CREATININE 1.11 06/02/2015 0446   CREATININE 1.37* 04/29/2015 0946   CALCIUM 8.4* 06/02/2015 0446   GFRNONAA >60 06/02/2015 0446   GFRAA >60 06/02/2015 0446    COAG Lab Results  Component Value Date   INR 1.46 05/30/2015   INR 1.07 05/21/2015   INR 0.94 05/30/2012   No results found for: PTT  Antibiotics Anti-infectives    Start     Dose/Rate Route Frequency Ordered Stop   05/30/15 1800  cefUROXime  (ZINACEF) 1.5 g in dextrose 5 % 50 mL IVPB     1.5 g 100 mL/hr over 30 Minutes Intravenous Every 12 hours 05/30/15 1400 05/31/15 0708   05/30/15 0548  cefUROXime (ZINACEF) 1.5 g in dextrose 5 % 50 mL IVPB     1.5 g 100 mL/hr over 30 Minutes Intravenous 30 min pre-op 05/30/15 0548 05/30/15 [place], PA-C [facility] Office: [phone] Pager: [phone] 06/04/2015 10:08 AM

## 2015-06-05 ENCOUNTER — Inpatient Hospital Stay (HOSPITAL_COMMUNITY): Payer: BLUE CROSS/BLUE SHIELD

## 2015-06-05 LAB — TYPE AND SCREEN
ABO/RH(D): A NEG
ANTIBODY SCREEN: NEGATIVE
Unit division: 0
Unit division: 0

## 2015-06-05 MED ORDER — LORAZEPAM 2 MG/ML IJ SOLN: 0.5000 mg | INTRAMUSCULAR | Status: DC | PRN

## 2015-06-05 MED ORDER — FUROSEMIDE 10 MG/ML IJ SOLN
20.0000 mg | Freq: Once | INTRAMUSCULAR | Status: AC
  Administered 2015-06-05: 20 mg via INTRAVENOUS
  Filled 2015-06-05: qty 2

## 2015-06-05 NOTE — Progress Notes (Signed)
Physical Therapy Treatment Patient Details Name: Casimir Barcellos Sr. MRN: [id number] DOB: [birth year] Today's Date: 06/05/2015    History of Present Illness Adm 9/22 for AAA repair and aortobifemoral BPG PMHx- CHF, HTN, CAD, tobacco use    PT Comments    Progressing steadily with gait stability and activity tolerance.  Follow Up Recommendations  No PT follow up     Equipment Recommendations  Rolling walker with 5" wheels    Recommendations for Other Services       Precautions / Restrictions Precautions Precautions: Fall    Mobility  Bed Mobility               General bed mobility comments: up in the chair on arrival  Transfers Overall transfer level: Needs assistance Equipment used: Rolling walker (2 wheeled) Transfers: Sit to/from Stand Sit to Stand: Min guard Stand pivot transfers: Min guard       General transfer comment: verbal cues for hand placement and safe walker use   Ambulation/Gait Ambulation/Gait assistance: Min guard Ambulation Distance (Feet): 15 Feet (to bathroom and the 300 feet with RW) Assistive device: Rolling walker (2 wheeled) Gait Pattern/deviations: Step-through pattern;Trunk flexed Gait velocity: slower   General Gait Details: cues for postural checks and better use of the RW   Stairs            Wheelchair Mobility    Modified Rankin (Stroke Patients Only)       Balance Overall balance assessment: Needs assistance Sitting-balance support: Feet supported Sitting balance-Leahy Scale: Good     Standing balance support: No upper extremity supported;During functional activity Standing balance-Leahy Scale: Fair                      Cognition Arousal/Alertness: Awake/alert Behavior During Therapy: WFL for tasks assessed/performed Overall Cognitive Status: Within Functional Limits for tasks assessed Area of Impairment: Safety/judgement;Problem solving       Following Commands: Follows one step commands  consistently Safety/Judgement: Decreased awareness of safety   Problem Solving: Requires verbal cues General Comments: Pt requires cues for safety and problem solving during ADLs    Exercises      General Comments General comments (skin integrity, edema, etc.): EHR in the low 100's, Sats 94% and RR 20-30 rpm      Pertinent Vitals/Pain Pain Assessment: Faces Pain Score: 0-No pain Faces Pain Scale: No hurt    Home Living                      Prior Function            PT Goals (current goals can now be found in the care plan section) Acute Rehab PT Goals Patient Stated Goal: get home soon PT Goal Formulation: With patient Time For Goal Achievement: 06/07/15 Potential to Achieve Goals: Good Progress towards PT goals: Progressing toward goals    Frequency  Min 3X/week    PT Plan Current plan remains appropriate    Co-evaluation             End of Session   Activity Tolerance: Patient tolerated treatment well Patient left: in chair;with call bell/phone within reach     Time: 1240-1258 PT Time Calculation (min) (ACUTE ONLY): 18 min  Charges:  $Gait Training: 8-22 mins                    G Codes:      Mottinger, Tessie Fass 06/05/2015, 4:56 PM  06/05/2015  Donnella Sham, PT [phone]  (pager)

## 2015-06-05 NOTE — Progress Notes (Signed)
UR COMPLETED  

## 2015-06-05 NOTE — Progress Notes (Signed)
  AAA Progress Note    06/05/2015 7:24 AM 6 Days Post-Op  Subjective:  C/o soreness; denies N/V; +flatus/BM  Tm 100.1 now afebrile HR 80's-100's NSR 295'A-213'Y systolic 865% [id number]  Filed Vitals:   06/05/15 0527  BP:   Pulse: 89  Temp:   Resp: 28    Physical Exam: Cardiac:  regular Lungs:  Expiratory wheezes throughout Abdomen:  Slight distension, but soft Incisions:  Healing nicely; ecchymosis around incisions Extremities:  Bilateral feet are warm; easily palpable DP pulses bilaterally  CBC    Component Value Date/Time   WBC 9.3 06/02/2015 0446   RBC 3.02* 06/02/2015 0446   HGB 10.2* 06/02/2015 0446   HCT 30.5* 06/02/2015 0446   PLT 119* 06/02/2015 0446   MCV 101.0* 06/02/2015 0446   MCH 33.8 06/02/2015 0446   MCHC 33.4 06/02/2015 0446   RDW 12.6 06/02/2015 0446   LYMPHSABS 0.4* 05/30/2012 0600   MONOABS 0.4 05/30/2012 0600   EOSABS 0.1 05/30/2012 0600   BASOSABS 0.0 05/30/2012 0600    BMET    Component Value Date/Time   NA 135 06/02/2015 0446   K 4.1 06/02/2015 0446   CL 105 06/02/2015 0446   CO2 22 06/02/2015 0446   GLUCOSE 88 06/02/2015 0446   BUN 12 06/02/2015 0446   CREATININE 1.11 06/02/2015 0446   CREATININE 1.37* 04/29/2015 0946   CALCIUM 8.4* 06/02/2015 0446   GFRNONAA >60 06/02/2015 0446   GFRAA >60 06/02/2015 0446    INR    Component Value Date/Time   INR 1.46 05/30/2015 1230     Intake/Output Summary (Last 24 hours) at 06/05/15 0724 Last data filed at 06/05/15 0200  Gross per 24 hour  Intake   1405 ml  Output    475 ml  Net    930 ml     Assessment/Plan:  64 y.o. male is s/p  aortobifemoral bypass 6 Days Post-Op  -pt with patent bypass graft with palpable DP pulses bilaterally -pt having exp wheezes throughout-received Xopenex last night.  Will get PA/LAT CXR, pulmonary toilet, increase mobilization. -reduce dose of Ativan-received '2mg'$  yesterday morning 1000 and was lethargic throughout the day and night.  Oriented  this am. -pt with BM yesterday and pt passing flatus.  Tolerating diet without N/V -acute surgical blood loss anemia improving-pt tolerating -Lovenox for DVT prophylaxis   Leontine Locket, PA-C [facility] [phone] 06/05/2015 7:24 AM  I agree with the above. GI: He has had return of bowel function and is tolerating regular diet GU: Creatinine about back to baseline.  I will give him a dose of IV Lasix today Pulmonary: Still with coarse breath sounds and congestion.  Chest x-ray shows no acute findings.  I have encouraged him to continue with incentive spirometry.  Continue with mobilization.  If he remains afebrile, consideration for discharge tomorrow or the following day.  Annamarie Major

## 2015-06-05 NOTE — Progress Notes (Signed)
Occupational Therapy Treatment Patient Details Name: Adam Groman Sr. MRN: [id number] DOB: Jan 28, 1951 Today's Date: 06/05/2015    History of present illness Adm 9/22 for AAA repair and aortobifemoral BPG PMHx- CHF, HTN, CAD, tobacco use   OT comments  Pt is able to perform ADLs with min guard assisst.  He requires close supervision for safety.  02 sats >96% on RA.  Encouraged IS use.   Follow Up Recommendations  No OT follow up;Supervision/Assistance - 24 hour    Equipment Recommendations  None recommended by OT    Recommendations for Other Services      Precautions / Restrictions Precautions Precautions: Fall       Mobility Bed Mobility                  Transfers Overall transfer level: Needs assistance Equipment used: Rolling walker (2 wheeled) Transfers: Sit to/from [facility] Sit to Stand: Min guard Stand pivot transfers: Min guard       General transfer comment: verbal cues for hand placement and safe walker use     Balance Overall balance assessment: Needs assistance Sitting-balance support: Feet supported Sitting balance-Leahy Scale: Good     Standing balance support: No upper extremity supported;During functional activity Standing balance-Leahy Scale: Fair                     ADL       Grooming: Wash/dry hands;Wash/dry face;Oral care;Min guard;Standing Grooming Details (indicate cue type and reason): Pt requires cues for thoroughness     Lower Body Bathing: Min guard;Sit to/from stand       Lower Body Dressing: Min guard;Sit to/from stand   Toilet Transfer: Min guard;Ambulation;Comfort height toilet;RW [employer] forces technical [employer] Details (indicate cue type and reason): cues for hand placement  Toileting- Clothing Manipulation and Hygiene: Minimal assistance;Sit to/from stand       Functional mobility during ADLs: Min guard;Rolling walker General ADL Comments: Pt able to don/doff socks       Vision                      Perception     Praxis      Cognition   Behavior During Therapy: [facility] for tasks assessed/performed Overall Cognitive Status: Impaired/Different from baseline Area of Impairment: Safety/judgement;Problem solving          Safety/Judgement: Decreased awareness of safety   Problem Solving: Requires verbal cues General Comments: Pt requires cues for safety and problem solving during ADLs    Extremity/Trunk Assessment               Exercises     Shoulder Instructions       General Comments      Pertinent Vitals/ Pain       Pain Assessment: Faces Pain Score: 0-No pain  Home Living                                          Prior Functioning/Environment              Frequency Min 2X/week     Progress Toward Goals  OT Goals(current goals can now be found in the care plan section)  Progress towards OT goals: Progressing toward goals  ADL Goals Pt Will Perform Grooming: standing;Independently Pt Will Perform Lower Body Bathing: sit to/from stand;Independently Pt Will Perform Lower Body Dressing: Independently;sit to/from  stand Pt Will Transfer to Toilet: Independently;ambulating;regular height toilet Pt Will Perform Tub/Shower Transfer: Tub transfer;ambulating (sitting <> standing in tub) Additional ADL Goal #1: Pt will be independent with functional mobility to safely complete ADLs and IADLs at home  Plan Discharge plan remains appropriate    Co-evaluation                 End of Session Equipment Utilized During Treatment: Rolling walker   Activity Tolerance Patient tolerated treatment well   Patient Left in chair;with call bell/phone within reach;with chair alarm set   Nurse Communication Mobility status        Time: 4696-2952 OT Time Calculation (min): 32 min  Charges: OT General Charges $OT Visit: 1 Procedure OT Treatments $Self Care/Home Management : 23-37 mins  Conarpe, Wendi M 06/05/2015, 4:08  PM

## 2015-06-06 ENCOUNTER — Telehealth: Payer: Self-pay | Admitting: Surgery

## 2015-06-06 LAB — CREATININE, SERUM
Creatinine, Ser: 1.26 mg/dL — ABNORMAL HIGH (ref 0.61–1.24)
GFR calc Af Amer: >60 mL/min (ref >60)
GFR calc non Af Amer: 59 mL/min — ABNORMAL LOW (ref >60)

## 2015-06-06 MED ORDER — TRAMADOL HCL 50 MG PO TABS: 50.0000 mg | ORAL_TABLET | Freq: Four times a day (QID) | ORAL | Status: AC | PRN

## 2015-06-06 MED ORDER — IPRATROPIUM BROMIDE HFA 17 MCG/ACT IN AERS: 1.0000 | INHALATION_SPRAY | Freq: Four times a day (QID) | RESPIRATORY_TRACT | Status: AC | PRN

## 2015-06-06 MED ORDER — PNEUMOCOCCAL VAC POLYVALENT 25 MCG/0.5ML IJ INJ: 0.5000 mL | INJECTION | INTRAMUSCULAR | Status: DC

## 2015-06-06 MED ORDER — NITROGLYCERIN 0.4 MG SL SUBL: 0.4000 mg | SUBLINGUAL_TABLET | SUBLINGUAL | Status: DC | PRN

## 2015-06-06 MED ORDER — LEVALBUTEROL HCL 0.63 MG/3ML IN NEBU: 0.6300 mg | INHALATION_SOLUTION | Freq: Four times a day (QID) | RESPIRATORY_TRACT | Status: AC | PRN

## 2015-06-06 MED ORDER — INFLUENZA VAC SPLIT QUAD 0.5 ML IM SUSY: 0.5000 mL | PREFILLED_SYRINGE | INTRAMUSCULAR | Status: DC

## 2015-06-06 MED ORDER — LEVALBUTEROL HCL 0.63 MG/3ML IN NEBU
0.6300 mg | INHALATION_SOLUTION | Freq: Three times a day (TID) | RESPIRATORY_TRACT | Status: DC
  Administered 2015-06-06: 0.63 mg via RESPIRATORY_TRACT
  Filled 2015-06-06: qty 3

## 2015-06-06 MED ORDER — LEVOFLOXACIN 500 MG PO TABS: 500.0000 mg | ORAL_TABLET | Freq: Every day | ORAL | Status: DC

## 2015-06-06 NOTE — Progress Notes (Signed)
Printed scripts of record provided to patient.

## 2015-06-06 NOTE — Progress Notes (Signed)
Pt prepared for D/C home accompanied by wife.  Personal belongings in patient possession.  All questions answered to expressed patient satisfaction.

## 2015-06-06 NOTE — Discharge Summary (Signed)
AAA Discharge Summary    Adam Broz Sr. August 31, 1951 64 y.o. male  [id number]  Admission Date: 05/30/2015  Discharge Date: 06/06/15  Physician: Serafina Mitchell, MD  Admission Diagnosis: Abdominal aortic aneurysm I71.4   HPI:   This is a 64 y.o. male The patient is back for follow-up of his aneurysm. He recently had a CT scan as well as multiple preoperative studies.  I have reviewed the patient's CT scan with him. There are several reasons that I think he is not a good candidate for endovascular repair. First is the fact that he has a short, 12 mm infrarenal neck. Second, he has bilateral high-grade external iliac disease which I think male create difficulty getting the device in place and will likely require subsequent stenting. I told him I think the best option for him is to proceed with aortobifemoral bypass graft. I discussed the risks and benefits of this operation including the risk of cardioplegic complications, wound complications intestinal ischemia lower extremity ischemia. He wishes to proceed. All his questions were answered today. I spent in excess of 60 minutes discussing the patient's case with him and reviewing his imaging. Greater than 50% of that was face-to-face. I have scheduled his operation for Thursday, September 22.  Hospital Course:  The patient was admitted to the hospital and taken to the operating room on 05/30/2015 and underwent: Aortobifemoral bypass graft using a 18 x 9 bifurcated dacryon graft    The pt tolerated the procedure well and was transported to the PACU in good condition. He was having pain control issues and was placed on a PCA.  He had palpable bilateral DP pulses throughout his hospitalization.  On POD 1, he did have 500cc out of his NGT and this was left in place.  He did have some hyponatremia and he was changed to NS IVF.  This was resolved by discharge.  He also had acute blood loss anemia that did not require a transfusion.     The next night, he did have sx of etoh withdrawal and CIWA protocol was started and a banana bag.  His NGT was also left another day.    By POD 3, he was having worsening withdrawal sx and required restraints to avoid injury.  His NGT and foley were also removed that day.  On POD 4, he was started on a clear liquid diet.  He was to transfer to [facility], however, there were not any beds available.  By POD 5, he was passing flatus and had a BM.  His diet was advanced to a regular diet.    By POD 6, he was doing well.  He did have some expiratory wheezing through out.  CXR revealed no acute findings.  This was improved on the left on POD 7, but still have some wheezing at the right base.  He was given some lasix on POD 6.   His mental status was back to baseline.  Pt continues to have a productive cough and therefore, Dr. Trula Slade is sending the pt home with 7 days of Levaquin given the pt does have a new bypass graft placed.  ABI's were not obtained postoperatively.  The remainder of the hospital course consisted of increasing mobilization and increasing intake of solids without difficulty.  CBC    Component Value Date/Time   WBC 9.3 06/02/2015 0446   RBC 3.02* 06/02/2015 0446   HGB 10.2* 06/02/2015 0446   HCT 30.5* 06/02/2015 0446   PLT 119* 06/02/2015 0446  MCV 101.0* 06/02/2015 0446   MCH 33.8 06/02/2015 0446   MCHC 33.4 06/02/2015 0446   RDW 12.6 06/02/2015 0446   LYMPHSABS 0.4* 05/30/2012 0600   MONOABS 0.4 05/30/2012 0600   EOSABS 0.1 05/30/2012 0600   BASOSABS 0.0 05/30/2012 0600    BMET    Component Value Date/Time   NA 135 06/02/2015 0446   K 4.1 06/02/2015 0446   CL 105 06/02/2015 0446   CO2 22 06/02/2015 0446   GLUCOSE 88 06/02/2015 0446   BUN 12 06/02/2015 0446   CREATININE 1.26* 06/06/2015 0242   CREATININE 1.37* 04/29/2015 0946   CALCIUM 8.4* 06/02/2015 0446   GFRNONAA 59* 06/06/2015 0242   GFRAA >60 06/06/2015 0242     Discharge Instructions     ABDOMINAL PROCEDURE/ANEURYSM REPAIR/AORTO-BIFEMORAL BYPASS:  Call MD for increased abdominal pain; cramping diarrhea; nausea/vomiting    Complete by:  As directed      Call MD for:  redness, tenderness, or signs of infection (pain, swelling, bleeding, redness, odor or green/yellow discharge around incision site)    Complete by:  As directed      Call MD for:  severe or increased pain, loss or decreased feeling  in affected limb(s)    Complete by:  As directed      Call MD for:  temperature >100.5    Complete by:  As directed      Discharge wound care:    Complete by:  As directed   Wash the groin wound with soap and water daily and pat dry. (No tub bath-only shower)  Then put a dry gauze or washcloth there to keep this area dry daily and as needed to help prevent infection.  Do not use Vaseline or neosporin on your incisions.  Only use soap and water on your incisions and then protect and keep dry.     Driving Restrictions    Complete by:  As directed   No driving for 2 weeks     Lifting restrictions    Complete by:  As directed   No lifting for 4 weeks     Resume previous diet    Complete by:  As directed            Discharge Diagnosis:  Abdominal aortic aneurysm I71.4  Secondary Diagnosis: Patient Active Problem List   Diagnosis Date Noted  . AAA (abdominal aortic aneurysm) 03/19/2015  . CAD (coronary artery disease) 06/05/2013  . Hyperlipidemia with target LDL less than 70 06/05/2013  . Leukopenia 05/29/2012  . Thrombocytopenia 05/29/2012  . NSTEMI (non-ST elevated myocardial infarction). 05/28/12 05/28/2012  . HTN (hypertension) 05/28/2012  . Tobacco abuse 05/28/2012  . 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 Hypertension   . NSTEMI (non-ST elevated myocardial infarction). 05/28/12 05/28/2012  . HTN (hypertension) 05/28/2012  . Tobacco abuse 05/28/2012  . Thrombocytopenia 05/29/2012  . Peripheral  arterial disease   . Abdominal aortic aneurysm   . Congestive heart failure   . Coronary artery disease   . Shortness of breath dyspnea   . History of hiatal hernia        Medication List    TAKE these medications        aspirin 81 MG tablet  Take 81 mg by mouth daily.     atorvastatin 40 MG tablet  Commonly known as:  LIPITOR  Take 1 tablet (40 mg total) by mouth daily at 6 PM.  carvedilol 12.5 MG tablet  Commonly known as:  COREG  Take 1.5 tablets (18.75 mg total) by mouth 2 (two) times daily with a meal.     ipratropium 17 MCG/ACT inhaler  Commonly known as:  ATROVENT HFA  Inhale 1-2 puffs into the lungs every 6 (six) hours as needed for wheezing.     levalbuterol 0.63 MG/3ML nebulizer solution  Commonly known as:  XOPENEX  Take 3 mLs (0.63 mg total) by nebulization every 6 (six) hours as needed for wheezing or shortness of breath.     levofloxacin 500 MG tablet  Commonly known as:  LEVAQUIN  Take 1 tablet (500 mg total) by mouth daily.     lisinopril 20 MG tablet  Commonly known as:  PRINIVIL,ZESTRIL  Take 1 tablet (20 mg total) by mouth daily.     nitroGLYCERIN 0.4 MG SL tablet  Commonly known as:  NITROSTAT  Place 1 tablet (0.4 mg total) under the tongue every 5 (five) minutes as needed for chest pain (up to 3 tabs in 15 mins).     psyllium 58.6 % powder  Commonly known as:  METAMUCIL  Take 1 packet by mouth daily.     traMADol 50 MG tablet  Commonly known as:  ULTRAM  Take 1 tablet (50 mg total) by mouth every 6 (six) hours as needed.        Prescriptions given: 1.  Tramadol#30 No Refill 2.   Atrovent 1 inhaler with 2RF (further refills per PCP) 3.   Levaquin '500mg'$  daily x 7 days #7 NR  (pt still coughing up sputum and given he has new graft, will give 7 days of Abx)  Instructions: 1.  Wash the groin wound with soap and water daily and pat dry. (No tub bath-only shower)  Then put a dry gauze or washcloth there to keep this area dry daily and as  needed.  Do not use Vaseline or neosporin on your incisions.  Only use soap and water on your incisions and then protect and keep dry.  Disposition: home  Patient's condition: is Good  Follow up: 1. Dr. Trula Slade in 2 weeks and will need ABI's at that time   Leontine Locket, PA-C [facility] [phone] 06/06/2015  8:06 AM   - For VQI Registry use ---   Post-op:  Time to Extubation: '[x]'$  In OR, '[ ]'$  < 12 hrs, '[ ]'$  12-24 hrs, '[ ]'$  >=24 hrs Vasopressors Req. Post-op: No ICU Stay: 3 day in ICU and 4 days in stepdown Transfusion: No  If yes, n/a units given MI: No, '[ ]'$  Troponin only, '[ ]'$  EKG or Clinical New Arrhythmia: No  Complications: CHF: No Resp failure: No, '[ ]'$  Pneumonia, '[ ]'$  Ventilator Chg in renal function: No, '[ ]'$  Inc. Cr > 0.5, '[ ]'$  Temp. Dialysis, '[ ]'$  Permanent dialysis Leg ischemia: No, no Surgery needed, '[ ]'$  Yes, Surgery needed, '[ ]'$  Amputation Bowel ischemia: No, '[ ]'$  Medical Rx, '[ ]'$  Surgical Rx Wound complication: No, '[ ]'$  Superficial separation/infection, '[ ]'$  Return to OR Return to OR: No  Return to OR for bleeding: No Stroke: No, '[ ]'$  Minor, '[ ]'$  Major  Discharge medications: Statin use:  Yes If No: '[ ]'$  For Medical reasons, '[ ]'$  Non-compliant ASA use:  Yes  If No: '[ ]'$  For Medical reasons, '[ ]'$  Non-compliant Plavix use:  No If No: '[ ]'$  For Medical reasons, '[ ]'$  Non-compliant Beta blocker use:  Yes If No: '[ ]'$  For  Medical reasons, '[ ]'$  Non-compliant ACEI use:  Yes

## 2015-06-06 NOTE — Telephone Encounter (Addendum)
-----   Message from Gabriel Earing, [place] sent at 06/06/2015  8:25 AM EDT ----- S/p Aortobifemoral bypass graft using a 18 x 9 bifurcated dacryon graft on 05/30/15.  F/u with Dr. Trula Slade in 2 weeks. He will need ABI's at f/u appt.  Thanks, Aldona Bar  notified patient's wife of post op appt. on 06-21-15 for abi's then 06-24-15 to see dr. Trula Slade

## 2015-06-06 NOTE — Progress Notes (Addendum)
  AAA Progress Note    06/06/2015 7:25 AM 7 Days Post-Op  Subjective:  Feeling better and wants to go home  afebrile HR  80's-90's NSR 45'H-460'Q systolic 79% RA  Filed Vitals:   06/06/15 0300  BP: 117/72  Pulse: 91  Temp: 98.3 F (36.8 C)  Resp: 28    Physical Exam: Cardiac:  regular Lungs:  Exp wheezes right base; left lung is clear Abdomen:  Slight distension, but soft; +flatus; +BM Incisions:  All incisions are healing nicely Extremities:  Easily palpable bilateral DP pulses with warm feet  CBC    Component Value Date/Time   WBC 9.3 06/02/2015 0446   RBC 3.02* 06/02/2015 0446   HGB 10.2* 06/02/2015 0446   HCT 30.5* 06/02/2015 0446   PLT 119* 06/02/2015 0446   MCV 101.0* 06/02/2015 0446   MCH 33.8 06/02/2015 0446   MCHC 33.4 06/02/2015 0446   RDW 12.6 06/02/2015 0446   LYMPHSABS 0.4* 05/30/2012 0600   MONOABS 0.4 05/30/2012 0600   EOSABS 0.1 05/30/2012 0600   BASOSABS 0.0 05/30/2012 0600    BMET    Component Value Date/Time   NA 135 06/02/2015 0446   K 4.1 06/02/2015 0446   CL 105 06/02/2015 0446   CO2 22 06/02/2015 0446   GLUCOSE 88 06/02/2015 0446   BUN 12 06/02/2015 0446   CREATININE 1.26* 06/06/2015 0242   CREATININE 1.37* 04/29/2015 0946   CALCIUM 8.4* 06/02/2015 0446   GFRNONAA 59* 06/06/2015 0242   GFRAA >60 06/06/2015 0242    INR    Component Value Date/Time   INR 1.46 05/30/2015 1230     Intake/Output Summary (Last 24 hours) at 06/06/15 0725 Last data filed at 06/06/15 0300  Gross per 24 hour  Intake      0 ml  Output    250 ml  Net   -250 ml     Assessment/Plan:  65 y.o. male is s/p  s/p aortobifemoral bypass grafting 7 Days Post-Op  -pt doing much better today. -bypass graft is patent with easily palpable pulses CXR yesterday was with no acute findings.  I have encouraged him to continue using his incentive spirometer even when he goes home to help exercise his lungs to help prevent getting PNA -will discharge home  today with HHPT and a RW -f/u with Dr. Trula Slade in 2 weeks.   Leontine Locket, PA-C [facility] [phone] 06/06/2015 7:25 AM

## 2015-06-10 ENCOUNTER — Telehealth: Payer: Self-pay

## 2015-06-10 ENCOUNTER — Other Ambulatory Visit: Payer: Self-pay | Admitting: *Deleted

## 2015-06-10 DIAGNOSIS — R0602 Shortness of breath: Secondary | ICD-10-CM

## 2015-06-10 DIAGNOSIS — G4700 Insomnia, unspecified: Secondary | ICD-10-CM

## 2015-06-10 MED ORDER — ZOLPIDEM TARTRATE 5 MG PO TABS: 5.0000 mg | ORAL_TABLET | Freq: Every evening | ORAL | Status: AC | PRN

## 2015-06-10 NOTE — Telephone Encounter (Signed)
Phone call from pt.  C/o not sleeping well at night.  Requested medication to help him sleep.  Discussed with Dr. Trula Slade.  Recommended Ambien 5 mg. 1 tab, po., q hs / prn; # 20; no refills.  Pt. notified of Rx for sleeping medication to be sent to the pharmacy.  Advised for further prescriptions for sleeping medication, to contact his PCP; verb. understanding.

## 2015-06-20 ENCOUNTER — Other Ambulatory Visit: Payer: Self-pay | Admitting: *Deleted

## 2015-06-20 ENCOUNTER — Encounter: Payer: Self-pay | Admitting: Surgery

## 2015-06-20 DIAGNOSIS — I739 Peripheral vascular disease, unspecified: Secondary | ICD-10-CM

## 2015-06-20 DIAGNOSIS — Z48812 Encounter for surgical aftercare following surgery on the circulatory system: Secondary | ICD-10-CM

## 2015-06-21 ENCOUNTER — Other Ambulatory Visit: Payer: Self-pay | Admitting: Surgery

## 2015-06-21 ENCOUNTER — Ambulatory Visit (HOSPITAL_COMMUNITY)
Admission: RE | Admit: 2015-06-21 | Discharge: 2015-06-21 | Disposition: A | Discharge status: Home / Self Care | Payer: BLUE CROSS/BLUE SHIELD | Source: Ambulatory Visit | Attending: Surgery | Admitting: Surgery

## 2015-06-21 DIAGNOSIS — I714 Abdominal aortic aneurysm, without rupture, unspecified: Secondary | ICD-10-CM

## 2015-06-21 DIAGNOSIS — I2510 Atherosclerotic heart disease of native coronary artery without angina pectoris: Secondary | ICD-10-CM | POA: Insufficient documentation

## 2015-06-21 DIAGNOSIS — Z48812 Encounter for surgical aftercare following surgery on the circulatory system: Secondary | ICD-10-CM

## 2015-06-21 DIAGNOSIS — I739 Peripheral vascular disease, unspecified: Secondary | ICD-10-CM

## 2015-06-21 DIAGNOSIS — I10 Essential (primary) hypertension: Secondary | ICD-10-CM | POA: Diagnosis not present

## 2015-06-24 ENCOUNTER — Encounter: Payer: Self-pay | Admitting: Surgery

## 2015-06-24 ENCOUNTER — Ambulatory Visit (INDEPENDENT_AMBULATORY_CARE_PROVIDER_SITE_OTHER): Payer: Self-pay | Admitting: Surgery

## 2015-06-24 VITALS — BP 119/78 | HR 69 | Temp 98.0°F | Resp 16 | Ht 70.5 in | Wt 124.0 lb

## 2015-06-24 DIAGNOSIS — I714 Abdominal aortic aneurysm, without rupture, unspecified: Secondary | ICD-10-CM

## 2015-06-24 NOTE — Progress Notes (Signed)
Patient name: Adam Marlin Sr. MRN: [id number] DOB: 17-Aug-1951 Sex: male     Chief Complaint  Patient presents with  . Routine Post Op    S/P   Bilat. Fem-Art. BPG  05-30-15      HISTORY OF PRESENT ILLNESS: Patient is back for follow-up.  On 05/30/2015 he will underwent open repair of an abdominal aortic aneurysm.  I did an aortobifemoral bypass graft using an 18 x 9 bifurcated graft because he had severe iliac occlusive disease as well.  His postoperative course was  uncomplicated.  He states that he has lost about 5 pounds and that his appetite is not quite back to normal.  He had a regular bowel movement today.  His pain is well controlled.  Past Medical History  Diagnosis Date  . Hypertension   . NSTEMI (non-ST elevated myocardial infarction). 05/28/12 05/28/2012  . HTN (hypertension) 05/28/2012  . Tobacco abuse 05/28/2012  . Thrombocytopenia ([place]) 05/29/2012  . Peripheral arterial disease ([place])   . Abdominal aortic aneurysm ([place])   . Congestive heart failure ([place])   . Coronary artery disease   . Shortness of breath dyspnea   . History of hiatal hernia     Past Surgical History  Procedure Laterality Date  . Hernia repair    . Coronary stent placement    . Left heart catheterization with coronary angiogram N/A 05/30/2012    Procedure: LEFT HEART CATHETERIZATION WITH CORONARY ANGIOGRAM;  Surgeon: Troy Sine, MD;  Location: [facility] CATH LAB;  Service: Cardiovascular;  Laterality: N/A;  . Cardiac catheterization    . Aorta - bilateral femoral artery bypass graft Bilateral 05/30/2015    Procedure: AORTOBIFEMORAL BYPASS GRAFT;  Surgeon: Serafina Mitchell, MD;  Location: [facility] OR;  Service: Vascular;  Laterality: Bilateral;    Social History   Social History  . Marital Status: Married    Spouse Name: N/A  . Number of Children: 2  . Years of Education: N/A   Occupational History  . Engineer, building services    Social History Main Topics  . Smoking status: Former Smoker -- 1.00 packs/day  for 45 years    Types: Cigarettes    Quit date: 07/08/2012  . Smokeless tobacco: Never Used  . Alcohol Use: 8.4 oz/week    14 Cans of beer per week     Comment: 6 pack weekly of beer  . Drug Use: No  . Sexual Activity: Not on file   Other Topics Concern  . Not on file   Social History Narrative    Family History  Problem Relation Age of Onset  . Coronary artery disease Mother   . Coronary artery disease Sister   . Heart disease Mother   . Heart attack Mother   . Peripheral vascular disease Mother   . AAA (abdominal aortic aneurysm) Mother     Allergies as of 06/24/2015 - Review Complete 06/24/2015  Allergen Reaction Noted  . Chlorhexidine Itching and Rash 05/30/2015  . Codeine Itching and Rash 05/28/2012    Current Outpatient Prescriptions on File Prior to Visit  Medication Sig Dispense Refill  . aspirin 81 MG tablet Take 81 mg by mouth daily.    Marland Kitchen atorvastatin (LIPITOR) 40 MG tablet Take 1 tablet (40 mg total) by mouth daily at 6 PM. 30 tablet 6  . carvedilol (COREG) 12.5 MG tablet Take 1.5 tablets (18.75 mg total) by mouth 2 (two) times daily with a meal. 90 tablet 6  . ipratropium (ATROVENT HFA) 17  MCG/ACT inhaler Inhale 1-2 puffs into the lungs every 6 (six) hours as needed for wheezing. 1 Inhaler 2  . levofloxacin (LEVAQUIN) 500 MG tablet Take 1 tablet (500 mg total) by mouth daily. 7 tablet 0  . lisinopril (PRINIVIL,ZESTRIL) 20 MG tablet Take 1 tablet (20 mg total) by mouth daily. 90 tablet 1  . nitroGLYCERIN (NITROSTAT) 0.4 MG SL tablet Place 1 tablet (0.4 mg total) under the tongue every 5 (five) minutes as needed for chest pain (up to 3 tabs in 15 mins). 25 tablet 2  . psyllium (METAMUCIL) 58.6 % powder Take 1 packet by mouth daily.    . traMADol (ULTRAM) 50 MG tablet Take 1 tablet (50 mg total) by mouth every 6 (six) hours as needed. 30 tablet 0  . zolpidem (AMBIEN) 5 MG tablet Take 1 tablet (5 mg total) by mouth at bedtime as needed for sleep. 20 tablet 0  .  levalbuterol (XOPENEX) 0.63 MG/3ML nebulizer solution Take 3 mLs (0.63 mg total) by nebulization every 6 (six) hours as needed for wheezing or shortness of breath. (Patient not taking: Reported on 06/24/2015) 3 mL 12   No current facility-administered medications on file prior to visit.       PHYSICAL EXAMINATION:   Vital signs are  Filed Vitals:   06/24/15 1422  BP: 119/78  Pulse: 69  Temp: 98 F (36.7 C)  TempSrc: Oral  Resp: 16  Height: 5' 10.5" (1.791 m)  Weight: 124 lb (56.246 kg)  SpO2: 100%   Body mass index is 17.53 kg/(m^2). General: The patient appears their stated age. Palpable femoral pulses Midline incision is well-healed without evidence of hernia Bilateral femoral incisions are well-healed  Diagnostic Studies None  Assessment: Status post aortobifemoral bypass graft for a 5.1 cm aneurysm and iliac occlusive disease Plan: The patient is doing very well.  I have encouraged him to increase his activity level.  I will have him follow-up again in January  V. Leia Alf, M.D. [facility] [employer] Office: [phone] Pager:  [phone]

## 2015-07-05 ENCOUNTER — Telehealth: Payer: Self-pay

## 2015-07-05 NOTE — Telephone Encounter (Signed)
Phone call from pt.  Reported having problems with constipation.  Reported he has taken MOM x 2 doses; on 10/25 and 10/27.  Stated that he had "good results" from 1st dose, and no results from 2nd dose.  Reported he also is taking a stool softener daily, and a suppository x1.  C/o stomach cramps, and passed a small diarrhea stool yesterday, and today.  Denied any abdominal bloating.  Denied nausea.  Reported he restarted taking Metamucil today.  Stated he is drinking water, and trying to drink several 8 oz. Glasses/day.  Encouraged to walk, continue fluid intake, continue daily dose of Metamucil, and give his system chance to adjust from taking recent laxatives and suppository.  Pt. Verb. understanding.  Advised to call if continued problems/ concerns.

## 2015-07-31 ENCOUNTER — Other Ambulatory Visit: Payer: Self-pay | Admitting: Cardiovascular Disease

## 2015-07-31 DIAGNOSIS — I714 Abdominal aortic aneurysm, without rupture, unspecified: Secondary | ICD-10-CM

## 2015-09-03 ENCOUNTER — Ambulatory Visit (HOSPITAL_COMMUNITY)
Admission: RE | Admit: 2015-09-03 | Discharge: 2015-09-03 | Disposition: A | Discharge status: Home / Self Care | Payer: BLUE CROSS/BLUE SHIELD | Source: Ambulatory Visit | Attending: Cardiovascular Disease | Admitting: Cardiovascular Disease

## 2015-09-03 DIAGNOSIS — F17200 Nicotine dependence, unspecified, uncomplicated: Secondary | ICD-10-CM | POA: Diagnosis not present

## 2015-09-03 DIAGNOSIS — I714 Abdominal aortic aneurysm, without rupture, unspecified: Secondary | ICD-10-CM

## 2015-09-03 DIAGNOSIS — I10 Essential (primary) hypertension: Secondary | ICD-10-CM | POA: Insufficient documentation

## 2015-09-20 ENCOUNTER — Encounter: Payer: Self-pay | Admitting: Surgery

## 2015-09-30 ENCOUNTER — Ambulatory Visit: Payer: BLUE CROSS/BLUE SHIELD | Admitting: Surgery

## 2015-10-04 ENCOUNTER — Other Ambulatory Visit: Payer: Self-pay | Admitting: Cardiovascular Disease

## 2015-10-04 NOTE — Telephone Encounter (Signed)
Rx request sent to pharmacy.  

## 2015-10-07 ENCOUNTER — Encounter: Payer: Self-pay | Admitting: Surgery

## 2015-10-07 ENCOUNTER — Ambulatory Visit: Payer: BLUE CROSS/BLUE SHIELD | Admitting: Surgery

## 2015-10-14 ENCOUNTER — Ambulatory Visit (INDEPENDENT_AMBULATORY_CARE_PROVIDER_SITE_OTHER): Payer: BLUE CROSS/BLUE SHIELD | Admitting: Surgery

## 2015-10-14 ENCOUNTER — Encounter: Payer: Self-pay | Admitting: Surgery

## 2015-10-14 VITALS — BP 121/74 | HR 84 | Resp 16 | Ht 70.5 in | Wt 130.9 lb

## 2015-10-14 DIAGNOSIS — I739 Peripheral vascular disease, unspecified: Secondary | ICD-10-CM

## 2015-10-14 DIAGNOSIS — Z48812 Encounter for surgical aftercare following surgery on the circulatory system: Secondary | ICD-10-CM | POA: Diagnosis not present

## 2015-10-14 DIAGNOSIS — I714 Abdominal aortic aneurysm, without rupture, unspecified: Secondary | ICD-10-CM

## 2015-10-14 NOTE — Addendum Note (Signed)
Addended by: Dorthula Rue L on: 10/14/2015 01:51 PM   Modules accepted: Orders

## 2015-10-14 NOTE — Progress Notes (Signed)
Patient name: Adam Colucci Sr. MRN: [id number] DOB: 22-Apr-1951 Sex: male     Chief Complaint  Patient presents with  . Re-evaluation    4 month f/u AAA    HISTORY OF PRESENT ILLNESS: Patient is back for follow-up. On 05/30/2015 he will underwent open repair of an abdominal aortic aneurysm. I did an aortobifemoral bypass graft using an 18 x 9 bifurcated graft because he had severe iliac occlusive disease as well. His postoperative course was uncomplicated.  He states he has a fullness in his left groin.  He is back to near normal activity.  His appetite is good.  He is having regular bowel movements.  His pain is controlled.  Past Medical History  Diagnosis Date  . Hypertension   . NSTEMI (non-ST elevated myocardial infarction). 05/28/12 05/28/2012  . HTN (hypertension) 05/28/2012  . Tobacco abuse 05/28/2012  . Thrombocytopenia ([place]) 05/29/2012  . Peripheral arterial disease ([place])   . Abdominal aortic aneurysm ([place])   . Congestive heart failure ([place])   . Coronary artery disease   . Shortness of breath dyspnea   . History of hiatal hernia     Past Surgical History  Procedure Laterality Date  . Hernia repair    . Coronary stent placement    . Left heart catheterization with coronary angiogram N/A 05/30/2012    Procedure: LEFT HEART CATHETERIZATION WITH CORONARY ANGIOGRAM;  Surgeon: Troy Sine, MD;  Location: [facility] CATH LAB;  Service: Cardiovascular;  Laterality: N/A;  . Cardiac catheterization    . Aorta - bilateral femoral artery bypass graft Bilateral 05/30/2015    Procedure: AORTOBIFEMORAL BYPASS GRAFT;  Surgeon: Serafina Mitchell, MD;  Location: [facility] OR;  Service: Vascular;  Laterality: Bilateral;    Social History   Social History  . Marital Status: Married    Spouse Name: N/A  . Number of Children: 2  . Years of Education: N/A   Occupational History  . Engineer, building services    Social History Main Topics  . Smoking status: Former Smoker -- 1.00 packs/day for 45  years    Types: Cigarettes    Quit date: 07/08/2012  . Smokeless tobacco: Never Used  . Alcohol Use: 8.4 oz/week    14 Cans of beer per week     Comment: 6 pack weekly of beer  . Drug Use: No  . Sexual Activity: Not on file   Other Topics Concern  . Not on file   Social History Narrative    Family History  Problem Relation Age of Onset  . Coronary artery disease Mother   . Coronary artery disease Sister   . Heart disease Mother   . Heart attack Mother   . Peripheral vascular disease Mother   . AAA (abdominal aortic aneurysm) Mother     Allergies as of 10/14/2015 - Review Complete 10/14/2015  Allergen Reaction Noted  . Chlorhexidine Itching and Rash 05/30/2015  . Codeine Itching and Rash 05/28/2012    Current Outpatient Prescriptions on File Prior to Visit  Medication Sig Dispense Refill  . aspirin 81 MG tablet Take 81 mg by mouth daily.    Marland Kitchen atorvastatin (LIPITOR) 40 MG tablet Take 1 tablet (40 mg total) by mouth daily at 6 PM. 30 tablet 2  . carvedilol (COREG) 12.5 MG tablet Take 1.5 tablets (18.75 mg total) by mouth 2 (two) times daily with a meal. 90 tablet 6  . ipratropium (ATROVENT HFA) 17 MCG/ACT inhaler Inhale 1-2 puffs into the lungs every 6 (  six) hours as needed for wheezing. 1 Inhaler 2  . levalbuterol (XOPENEX) 0.63 MG/3ML nebulizer solution Take 3 mLs (0.63 mg total) by nebulization every 6 (six) hours as needed for wheezing or shortness of breath. (Patient not taking: Reported on 06/24/2015) 3 mL 12  . levofloxacin (LEVAQUIN) 500 MG tablet Take 1 tablet (500 mg total) by mouth daily. 7 tablet 0  . lisinopril (PRINIVIL,ZESTRIL) 20 MG tablet Take 1 tablet (20 mg total) by mouth daily. 90 tablet 1  . nitroGLYCERIN (NITROSTAT) 0.4 MG SL tablet Place 1 tablet (0.4 mg total) under the tongue every 5 (five) minutes as needed for chest pain (up to 3 tabs in 15 mins). 25 tablet 2  . psyllium (METAMUCIL) 58.6 % powder Take 1 packet by mouth daily.    . traMADol (ULTRAM)  50 MG tablet Take 1 tablet (50 mg total) by mouth every 6 (six) hours as needed. 30 tablet 0  . zolpidem (AMBIEN) 5 MG tablet Take 1 tablet (5 mg total) by mouth at bedtime as needed for sleep. 20 tablet 0   No current facility-administered medications on file prior to visit.     REVIEW OF SYSTEMS: See history of present illness, otherwise all systems are negative  PHYSICAL EXAMINATION:   Vital signs are  Filed Vitals:   10/14/15 1014  BP: 121/74  Pulse: 84  Resp: 16  Height: 5' 10.5" (1.791 m)  Weight: 130 lb 14.4 oz (59.376 kg)  SpO2: 100%   Body mass index is 18.51 kg/(m^2). General: The patient appears their stated age. HEENT:  No gross abnormalities Pulmonary:  Non labored breathing Abdomen: Soft and non-tender midline incision has healed with no evidence of hernia Musculoskeletal: There are no major deformities. Neurologic: No focal weakness or paresthesias are detected, Skin: There are no ulcer or rashes noted. Psychiatric: The patient has normal affect. Cardiovascular: There is a regular rate and rhythm without significant murmur appreciated.  Palpable femoral pulse.  The area of concern in the left groin is actually the bypass graft underneath some scar tissue.  All incisions have completely healed.   Diagnostic Studies None  Assessment: Status post aortobifemoral bypass graft for aneurysmal degeneration as well as iliac stenosis Plan: The patient is doing very well this time.  He is nearly recovered completely from his operation.  I will have him follow-up in one year to evaluate his carotid arteries as he had mild stenosis in the past.  In addition he will need ABIs as well as a duplex of his aortobifemoral bypass graft.  I have also scheduled him for aneurysm ultrasound in 3 years.  Eldridge Abrahams, M.D. [facility] [place] Office: [phone] Pager:  [phone]

## 2015-10-15 NOTE — Addendum Note (Signed)
Addended by: Dorthula Rue L on: 10/15/2015 10:38 AM   Modules accepted: Orders

## 2015-10-15 NOTE — Addendum Note (Signed)
Addended by: Dorthula Rue L on: 10/15/2015 11:48 AM   Modules accepted: Orders

## 2015-10-15 NOTE — Addendum Note (Signed)
Addended by: Dorthula Rue L on: 10/15/2015 11:43 AM   Modules accepted: Orders

## 2015-10-15 NOTE — Addendum Note (Signed)
Addended by: Dorthula Rue L on: 10/15/2015 10:35 AM   Modules accepted: Orders

## 2015-10-15 NOTE — Addendum Note (Signed)
Addended by: Dorthula Rue L on: 10/15/2015 11:46 AM   Modules accepted: Orders

## 2015-11-11 ENCOUNTER — Other Ambulatory Visit: Payer: Self-pay | Admitting: *Deleted

## 2015-11-11 DIAGNOSIS — R911 Solitary pulmonary nodule: Secondary | ICD-10-CM

## 2015-11-28 ENCOUNTER — Telehealth: Payer: Self-pay | Admitting: Surgery

## 2015-11-28 NOTE — Telephone Encounter (Signed)
-----   Message from Mena Goes, RN sent at 11/11/2015  9:23 AM EST ----- Regarding: schedule   ----- Message -----    From: Serafina Mitchell, MD    Sent: 11/08/2015   9:07 PM      To: [facility] Charge Pool  I just saw this patient in early February.  I forgot to tell him that radiology wants a CT scan of the chest in 04/2016 to follow up on his initial CT scan in 8 /2016.  Can we schedule this and have him follow up with me after.  Thanks

## 2015-12-13 ENCOUNTER — Other Ambulatory Visit: Payer: Self-pay

## 2015-12-13 MED ORDER — ATORVASTATIN CALCIUM 40 MG PO TABS: 40.0000 mg | ORAL_TABLET | Freq: Every day | ORAL | Status: DC

## 2015-12-13 MED ORDER — CARVEDILOL 12.5 MG PO TABS: 18.7500 mg | ORAL_TABLET | Freq: Two times a day (BID) | ORAL | Status: DC

## 2015-12-13 MED ORDER — LISINOPRIL 20 MG PO TABS: 20.0000 mg | ORAL_TABLET | Freq: Every day | ORAL | Status: DC

## 2015-12-16 ENCOUNTER — Other Ambulatory Visit: Payer: Self-pay | Admitting: *Deleted

## 2015-12-16 MED ORDER — ATORVASTATIN CALCIUM 40 MG PO TABS: 40.0000 mg | ORAL_TABLET | Freq: Every day | ORAL | Status: DC

## 2015-12-16 MED ORDER — LISINOPRIL 20 MG PO TABS: 20.0000 mg | ORAL_TABLET | Freq: Every day | ORAL | Status: DC

## 2015-12-16 MED ORDER — CARVEDILOL 12.5 MG PO TABS: 18.7500 mg | ORAL_TABLET | Freq: Two times a day (BID) | ORAL | Status: DC

## 2015-12-19 ENCOUNTER — Telehealth: Payer: Self-pay | Admitting: Cardiovascular Disease

## 2015-12-19 MED ORDER — NITROGLYCERIN 0.4 MG SL SUBL: 0.4000 mg | SUBLINGUAL_TABLET | SUBLINGUAL | Status: AC | PRN

## 2015-12-19 MED ORDER — ATORVASTATIN CALCIUM 40 MG PO TABS: 40.0000 mg | ORAL_TABLET | Freq: Every day | ORAL | Status: DC

## 2015-12-19 MED ORDER — CARVEDILOL 12.5 MG PO TABS: 18.7500 mg | ORAL_TABLET | Freq: Two times a day (BID) | ORAL | Status: DC

## 2015-12-19 MED ORDER — LISINOPRIL 20 MG PO TABS: 20.0000 mg | ORAL_TABLET | Freq: Every day | ORAL | Status: DC

## 2015-12-19 NOTE — Telephone Encounter (Signed)
New Message   *STAT* If patient is at the pharmacy, call can be transferred to refill team.   1. Which medications need to be refilled? (please list name of each medication and dose if known) atorvastatin (LIPITOR) 40 MG tablet  2. Which pharmacy/location (including street and city if local pharmacy) is medication to be sent to? [place] [phone] 3. Do they need a 30 day or 90 day supply? 30 day   Please update this medication.

## 2015-12-19 NOTE — Telephone Encounter (Signed)
Returned call to patient he stated he needed refills for Lisinopril,atorvastatin,carvedilol sent to [facility].Refills sent to [facility].NTG refill sent to [facility] in [place].Appointment scheduled with [facility] 03/30/16 at 10:00 am.

## 2016-03-09 IMAGING — DX DG ABD PORTABLE 1V
1 series · 1 of 1 positions shown · non-contrast
Comparison: CTA chest, abdomen, and pelvis 05/02/2015

CLINICAL DATA: Postop, status post abdominal aortic aneurysm
repair.

EXAM:
PORTABLE ABDOMEN - 1 VIEW

[abdomen supine]
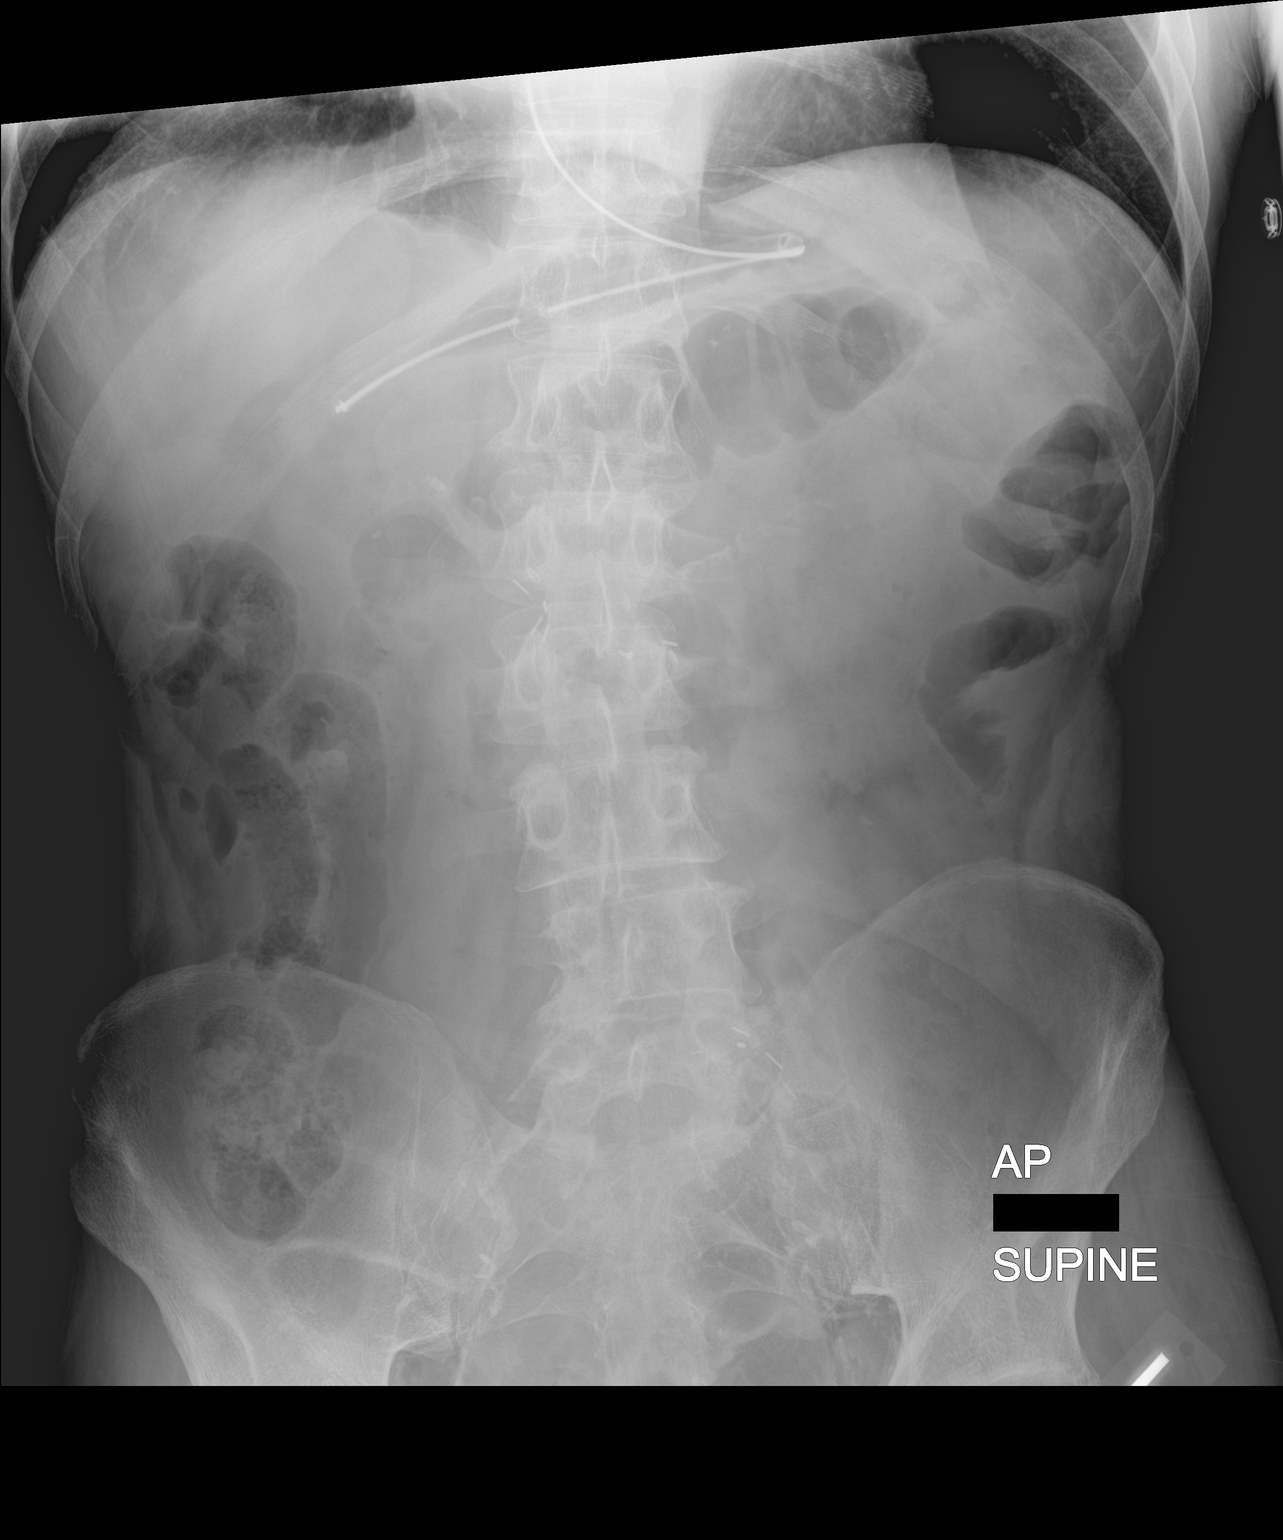

[1 of 1 positions shown; findings below may reference images not displayed]

FINDINGS: Enteric tube is present with tip in the region of the distal
stomach. Gas is present in scattered loops of nondilated small and
large bowel without evidence of obstruction. Lucency under the
medial right hemidiaphragm may reflect gas within the stomach and a
small amount of intraperitoneal free air related to abdominal
surgery earlier today. Surgical clips are present in the abdomen and
pelvis. Atherosclerotic vascular calcifications are noted. Sequelae
of prior right inguinal hernia repair are partially visualized. Mild
lumbar dextroscoliosis is noted.
IMPRESSION: Enteric tube in the distal stomach. Nonobstructed bowel-gas pattern.

## 2016-03-30 ENCOUNTER — Encounter: Payer: Self-pay | Admitting: Cardiovascular Disease

## 2016-03-30 ENCOUNTER — Ambulatory Visit (INDEPENDENT_AMBULATORY_CARE_PROVIDER_SITE_OTHER): Payer: Commercial Managed Care - HMO | Admitting: Cardiovascular Disease

## 2016-03-30 VITALS — BP 134/82 | HR 82 | Ht 70.0 in | Wt 128.0 lb

## 2016-03-30 DIAGNOSIS — I2510 Atherosclerotic heart disease of native coronary artery without angina pectoris: Secondary | ICD-10-CM

## 2016-03-30 DIAGNOSIS — I10 Essential (primary) hypertension: Secondary | ICD-10-CM

## 2016-03-30 DIAGNOSIS — I739 Peripheral vascular disease, unspecified: Secondary | ICD-10-CM

## 2016-03-30 DIAGNOSIS — I714 Abdominal aortic aneurysm, without rupture, unspecified: Secondary | ICD-10-CM

## 2016-03-30 DIAGNOSIS — Z79899 Other long term (current) drug therapy: Secondary | ICD-10-CM

## 2016-03-30 DIAGNOSIS — E785 Hyperlipidemia, unspecified: Secondary | ICD-10-CM

## 2016-03-30 MED ORDER — ATORVASTATIN CALCIUM 40 MG PO TABS: 40.0000 mg | ORAL_TABLET | Freq: Every day | ORAL | 3 refills | Status: DC

## 2016-03-30 MED ORDER — CARVEDILOL 12.5 MG PO TABS: 18.7500 mg | ORAL_TABLET | Freq: Two times a day (BID) | ORAL | 3 refills | Status: DC

## 2016-03-30 MED ORDER — LISINOPRIL 20 MG PO TABS: 20.0000 mg | ORAL_TABLET | Freq: Every day | ORAL | 3 refills | Status: DC

## 2016-03-30 NOTE — Patient Instructions (Signed)
Your physician recommends that you return for lab work fasting.   Your physician wants you to follow-up in: 1 year or sooner if needed. You will receive a reminder letter in the mail two months in advance. If you don't receive a letter, please call our office to schedule the follow-up appointment.  If you need a refill on your cardiac medications before your next appointment, please call your pharmacy.

## 2016-03-31 ENCOUNTER — Encounter: Payer: Self-pay | Admitting: Cardiovascular Disease

## 2016-03-31 DIAGNOSIS — I739 Peripheral vascular disease, unspecified: Secondary | ICD-10-CM | POA: Insufficient documentation

## 2016-03-31 NOTE — Progress Notes (Signed)
Patient ID: Adam Nettle Sr., male   DOB: [birth year], 65 y.o.   MRN: [id number]    Primary M.D.: None Vascular surgery: Dr. Trula Slade  HPI: Adam Culley Sr. is a 65 y.o. male who presents to the office for a 12 month cardiology evaluation.   Adam Lopez has a history of hematologic problems including leukopenia and thrombocytopenia and presented to [facility] on 05/30/2012 with a NSTEMI. Acute catheterization revealed 99% stenosis with significant thrombus in the RCA. He had significant coronary calcification. He underwent insertion of a 3.0x20 mm bare metal vision stent in the RCA post dilated 3.25 mm. 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 He also has mild/moderate disease in the iliac arteries bilaterally. He does note some left leg claudication his history of tobacco use, hyperlipidemia, and hypertension.  He has had issues with labile hypertension.  He has been taking lisinopril 15 mg daily, carvedilol 12.5 mg twice a day for blood pressure control.  He has been on atorvastatin 40 mg for hyperlipidemia.  He no longer is on dual antiplatelet therapy, but continues with aspirin 81 mg.  He admits to occasional cramps in his legs and feet.  He is a documented abdominal aortic aneurysm.  He denies recent chest pain.  He denies PND or apnea.  He has not had recent blood work.  He has a history of an abdominal aortic aneurysm and in January 2015 measured 4.4 x 4.2 cm.  On his abdominal ultrasound of 02/28/15, this had increased now to 5.3 x 5.5 cm.  However, on the same day.  he also had lower extremity Doppler studies and on that evaluation.  His fusiform distal abdominal aortic aneurysm measured 5.05.1 cm.  His ABIs had declined from one year previously and now was 0.86 on the left and 0.98 on the right.  He had increased left distal EIA velocity of 38 9 cm/s.  He does have claudication symptoms with these have  slightly improved recently compared to several months ago.  Since I last saw him, he underwent open repair of his abdominal aortic aneurysm on 05/30/2015 by Dr. Velta Addison.  He had an aortobifemoral bypass graft inserted which was an 18 x 9 bifurcation graft because of severe iliac occlusive disease.  He has a history of easy bruisability and for this reason has no longer been on dual and a platelet therapy, but just aspirin alone.  He denies recent chest pain, PND orthopnea.  He denies any palpitations.  He is a former smoker but quit tobacco in 2012.  He presents for follow-up evaluation.  Past Medical History:  Diagnosis Date  . Abdominal aortic aneurysm ([place])   . Congestive heart failure ([place])   . Coronary artery disease   . History of hiatal hernia   . HTN (hypertension) 05/28/2012  . Hypertension   . NSTEMI (non-ST elevated myocardial infarction). 05/28/12 05/28/2012  . Peripheral arterial disease ([place])   . Shortness of breath dyspnea   . Thrombocytopenia ([place]) 05/29/2012  . Tobacco abuse 05/28/2012    Past Surgical History:  Procedure Laterality Date  . AORTA - BILATERAL FEMORAL ARTERY BYPASS GRAFT Bilateral 05/30/2015   Procedure: AORTOBIFEMORAL BYPASS GRAFT;  Surgeon: Serafina Mitchell, MD;  Location: [place];  Service: Vascular;  Laterality: Bilateral;  . CARDIAC CATHETERIZATION    . CORONARY STENT PLACEMENT    . HERNIA REPAIR    . LEFT HEART CATHETERIZATION WITH  CORONARY ANGIOGRAM N/A 05/30/2012   Procedure: LEFT HEART CATHETERIZATION WITH CORONARY ANGIOGRAM;  Surgeon: Troy Sine, MD;  Location: [facility] CATH LAB;  Service: Cardiovascular;  Laterality: N/A;    Allergies  Allergen Reactions  . Chlorhexidine Itching and Rash  . Codeine Itching and Rash    Current Outpatient Prescriptions  Medication Sig Dispense Refill  . aspirin 81 MG tablet Take 81 mg by mouth daily.    Marland Kitchen atorvastatin (LIPITOR) 40 MG tablet Take 1 tablet (40 mg total) by mouth daily at 6 PM. 90 tablet 3  .  carvedilol (COREG) 12.5 MG tablet Take 1.5 tablets (18.75 mg total) by mouth 2 (two) times daily with a meal. 270 tablet 3  . ipratropium (ATROVENT HFA) 17 MCG/ACT inhaler Inhale 1-2 puffs into the lungs every 6 (six) hours as needed for wheezing. 1 Inhaler 2  . levalbuterol (XOPENEX) 0.63 MG/3ML nebulizer solution Take 3 mLs (0.63 mg total) by nebulization every 6 (six) hours as needed for wheezing or shortness of breath. 3 mL 12  . levofloxacin (LEVAQUIN) 500 MG tablet Take 1 tablet (500 mg total) by mouth daily. 7 tablet 0  . lisinopril (PRINIVIL,ZESTRIL) 20 MG tablet Take 1 tablet (20 mg total) by mouth daily. 90 tablet 3  . nitroGLYCERIN (NITROSTAT) 0.4 MG SL tablet Place 1 tablet (0.4 mg total) under the tongue every 5 (five) minutes as needed for chest pain (up to 3 tabs in 15 mins). 25 tablet 6  . psyllium (METAMUCIL) 58.6 % powder Take 1 packet by mouth daily.    . traMADol (ULTRAM) 50 MG tablet Take 1 tablet (50 mg total) by mouth every 6 (six) hours as needed. 30 tablet 0  . zolpidem (AMBIEN) 5 MG tablet Take 1 tablet (5 mg total) by mouth at bedtime as needed for sleep. 20 tablet 0   No current facility-administered medications for this visit.     Social History   Social History  . Marital status: Married    Spouse name: N/A  . Number of children: 2  . Years of education: N/A   Occupational History  . Engineer, building services    Social History Main Topics  . Smoking status: Former Smoker    Packs/day: 1.00    Years: 45.00    Types: Cigarettes    Quit date: 07/08/2012  . Smokeless tobacco: Never Used  . Alcohol use 8.4 oz/week    14 Cans of beer per week     Comment: 6 pack weekly of beer  . Drug use: No  . Sexual activity: Not on file   Other Topics Concern  . Not on file   Social History Narrative  . 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  Family History  Problem Relation Age of Onset  . Coronary artery disease Mother   . Heart disease  Mother   . Heart attack Mother   . Peripheral vascular disease Mother   . AAA (abdominal aortic aneurysm) Mother   . Coronary artery disease Sister    Social history married has 2 children one grandchild. He does try to exercise. There is a remote tobacco history but he quit many years ago. He does drink occasional alcohol.  ROS General: Negative; No fevers, chills, or night sweats;  HEENT: Negative; No changes in vision or hearing, sinus congestion, difficulty swallowing Pulmonary: Negative; No cough, wheezing, shortness of breath, hemoptysis Cardiovascular: See history of present illness GI: Negative; No nausea, vomiting, diarrhea, or  abdominal pain GU: Negative; No dysuria, hematuria, or difficulty voiding Musculoskeletal: Leg cramps with possible claudication, left leg. Hematologic/Oncology: Negative; no easy bruising, bleeding Endocrine: Negative; no heat/cold intolerance; no diabetes Neuro: Negative; no changes in balance, headaches Skin: Negative; No rashes or skin lesions Psychiatric: Negative; No behavioral problems, depression Sleep: Negative; No snoring, daytime sleepiness, hypersomnolence, bruxism, restless legs, hypnogognic hallucinations, no cataplexy Other comprehensive 14 point system review is negative.   PE BP 134/82   Pulse 82   Ht 5' 10"  (1.778 m)   Wt 128 lb (58.1 kg)   BMI 18.37 kg/m    Wt Readings from Last 3 Encounters:  03/30/16 128 lb (58.1 kg)  10/14/15 130 lb 14.4 oz (59.4 kg)  06/24/15 124 lb (56.2 kg)   General: Alert, oriented, no distress.  Skin: normal turgor, no rashes HEENT: Normocephalic, atraumatic. Pupils round and reactive; sclera anicteric;no lid lag.  Nose without nasal septal hypertrophy Mouth/Parynx benign; Mallinpatti scale 3 Neck: No JVD, no carotid bruits Lungs: clear to ausculatation and percussion; no wheezing or rales Chest wall: Nontender to palpation Heart: RRR, s1 s2 normal faint 1/6 systolic murmur; no S3 gallop.  No  diastolic murmur.  No rubs thrills or heaves. Abdomen: soft, nontender; no hepatosplenomehaly, BS+; abdominal aorta nontender and not dilated by palpation. Well-healed surgical scar from his abdominal aortic aneurysm surgery. Back: No CVA tenderness Pulses:  slightly diminished in his left leg Extremities: no clubbing cyanosis or edema, Homan's sign negative  Neurologic: grossly nonfocal Psychological: Normal affect and mood  ECG (independently read by me): Normal sinus rhythm at 82 bpm.  Q wave in lead aVL.  QRS complex V1 V2.  July 2016 ECG (independently read by me): Sinus rhythm at 62 bpm.  LVH.  QRS complex V1 V2 and prominent Q wave in aVL.  May 2016 ECG (independently read by me): Normal sinus rhythm at 65 bpm.  Possible LVH by voltage.  Scalloping of ST segments inferolaterally.  Not significantly hanged.  October 2014 ECG: Sinus rhythm at 61 beats per minute. A prominent Q wave in V1 and V 2 with R wave in V2.  Nonspecific ST changes.  LABS: BMP Latest Ref Rng & Units 06/06/2015 06/02/2015 05/31/2015  Glucose 65 - 99 mg/dL - 88 180(H)  BUN 6 - 20 mg/dL - 12 16  Creatinine 0.61 - 1.24 mg/dL 1.26(H) 1.11 1.40(H)  Sodium 135 - 145 mmol/L - 135 126(L)  Potassium 3.5 - 5.1 mmol/L - 4.1 4.8  Chloride 101 - 111 mmol/L - 105 100(L)  CO2 22 - 32 mmol/L - 22 22  Calcium 8.9 - 10.3 mg/dL - 8.4(L) 7.8(L)   Hepatic Function Latest Ref Rng & Units 05/31/2015 05/21/2015 01/29/2015  Total Protein 6.5 - 8.1 g/dL 5.2(L) 7.2 7.9  Albumin 3.5 - 5.0 g/dL 2.9(L) 4.0 4.0  AST 15 - 41 U/L 35 40 27  ALT 17 - 63 U/L 23 31 17   Alk Phosphatase 38 - 126 U/L 42 75 95  Total Bilirubin 0.3 - 1.2 mg/dL 0.9 0.8 0.7   CBC Latest Ref Rng & Units 06/02/2015 05/31/2015 05/30/2015  WBC 4.0 - 10.5 K/uL 9.3 8.8 9.9  Hemoglobin 13.0 - 17.0 g/dL 10.2(L) 9.0(L) 10.4(L)  Hematocrit 39.0 - 52.0 % 30.5(L) 26.1(L) 31.2(L)  Platelets 150 - 400 K/uL 119(L) 108(L) 132(L)   Lab Results  Component Value Date   TSH 3.611  01/29/2015   Lab Results  Component Value Date   HGBA1C 5.0 05/28/2012   Lipid Panel  Component Value Date/Time   CHOL 169 01/29/2015 0846   TRIG 81 01/29/2015 0846   HDL 69 01/29/2015 0846   CHOLHDL 2.4 01/29/2015 0846   VLDL 16 01/29/2015 0846   LDLCALC 84 01/29/2015 0846    RADIOLOGY: No results found.    ASSESSMENT AND PLAN: Adam Lopez is a 65 year old gentleman who is almost 4 years status post presenting with a NSTEMI and underwent successful intervention to subtotal RCA with significant thrombus burden. He had been on Effient for one year but stopped taking this in 2014.  He admits to easy bruisability.  He developed progressive enlargement of his abdominal aortic aneurysm and underwent successful surgery with placement of an aortobifemoral bypass graft in September 2016.  His blood pressure today is controlled and on repeat by me was 120/78.  On his medical regimen consisting of carvedilol 18.75 mg twice a day and lisinopril 20 mg daily.  He has hyperlipidemia with target LDL less than 70 and continues to be on atorvastatin 40 mg daily.  He uses soap and neck on an as-needed basis for wheezing in addition to Atrovent.  He is remote tobacco user, but forcefully quit smoking in 2012.  He is tolerating aspirin or antiplatelet therapy without recent bruising.  He will continue current therapy.  I have recommended fasting lab work be obtained.  Adjustments to his medications will be done if necessary.  I will see him in one year for reevaluation. Time spent: 25 minutes  Troy Sine, MD, [facility]  03/31/2016 6:56 PM

## 2016-04-09 DIAGNOSIS — I714 Abdominal aortic aneurysm, without rupture: Secondary | ICD-10-CM | POA: Diagnosis not present

## 2016-04-09 DIAGNOSIS — Z125 Encounter for screening for malignant neoplasm of prostate: Secondary | ICD-10-CM | POA: Diagnosis not present

## 2016-04-09 DIAGNOSIS — I739 Peripheral vascular disease, unspecified: Secondary | ICD-10-CM | POA: Diagnosis not present

## 2016-04-09 DIAGNOSIS — E785 Hyperlipidemia, unspecified: Secondary | ICD-10-CM | POA: Diagnosis not present

## 2016-04-09 DIAGNOSIS — Z1211 Encounter for screening for malignant neoplasm of colon: Secondary | ICD-10-CM | POA: Diagnosis not present

## 2016-04-09 DIAGNOSIS — D72818 Other decreased white blood cell count: Secondary | ICD-10-CM | POA: Diagnosis not present

## 2016-04-09 DIAGNOSIS — I2510 Atherosclerotic heart disease of native coronary artery without angina pectoris: Secondary | ICD-10-CM | POA: Diagnosis not present

## 2016-04-09 DIAGNOSIS — D696 Thrombocytopenia, unspecified: Secondary | ICD-10-CM | POA: Diagnosis not present

## 2016-04-09 DIAGNOSIS — I10 Essential (primary) hypertension: Secondary | ICD-10-CM | POA: Diagnosis not present

## 2016-06-19 ENCOUNTER — Other Ambulatory Visit: Payer: Self-pay | Admitting: Cardiovascular Disease

## 2016-06-19 NOTE — Telephone Encounter (Signed)
lisinopril (PRINIVIL,ZESTRIL) 20 MG tablet  Medication  Date: 03/30/2016 Department: [facility] Ordering/Authorizing: Troy Sine, MD  Order Providers   Prescribing Provider Encounter Provider  Troy Sine, MD Troy Sine, MD  Medication Detail    Disp Refills Start End   lisinopril (PRINIVIL,ZESTRIL) 20 MG tablet 90 tablet 3 03/30/2016    Sig - Route: Take 1 tablet (20 mg total) by mouth daily. - Oral   E-Prescribing Status: Receipt confirmed by pharmacy (03/30/2016 10:15 AM EDT)   Pharmacy   [facility] [place], [place] - [address]

## 2016-07-24 DIAGNOSIS — Z0000 Encounter for general adult medical examination without abnormal findings: Secondary | ICD-10-CM | POA: Diagnosis not present

## 2016-07-24 DIAGNOSIS — R791 Abnormal coagulation profile: Secondary | ICD-10-CM | POA: Diagnosis not present

## 2016-07-24 DIAGNOSIS — Z800 Family history of malignant neoplasm of digestive organs: Secondary | ICD-10-CM | POA: Diagnosis not present

## 2016-07-24 DIAGNOSIS — Z862 Personal history of diseases of the blood and blood-forming organs and certain disorders involving the immune mechanism: Secondary | ICD-10-CM | POA: Diagnosis not present

## 2016-10-05 DIAGNOSIS — I10 Essential (primary) hypertension: Secondary | ICD-10-CM | POA: Diagnosis not present

## 2016-10-05 DIAGNOSIS — I739 Peripheral vascular disease, unspecified: Secondary | ICD-10-CM | POA: Diagnosis not present

## 2016-10-05 DIAGNOSIS — D696 Thrombocytopenia, unspecified: Secondary | ICD-10-CM | POA: Diagnosis not present

## 2016-10-05 DIAGNOSIS — E785 Hyperlipidemia, unspecified: Secondary | ICD-10-CM | POA: Diagnosis not present

## 2016-10-12 DIAGNOSIS — D696 Thrombocytopenia, unspecified: Secondary | ICD-10-CM | POA: Diagnosis not present

## 2016-10-12 DIAGNOSIS — I10 Essential (primary) hypertension: Secondary | ICD-10-CM | POA: Diagnosis not present

## 2016-10-12 DIAGNOSIS — Z125 Encounter for screening for malignant neoplasm of prostate: Secondary | ICD-10-CM | POA: Diagnosis not present

## 2016-10-12 DIAGNOSIS — E785 Hyperlipidemia, unspecified: Secondary | ICD-10-CM | POA: Diagnosis not present

## 2016-10-12 DIAGNOSIS — I714 Abdominal aortic aneurysm, without rupture: Secondary | ICD-10-CM | POA: Diagnosis not present

## 2016-10-12 DIAGNOSIS — D692 Other nonthrombocytopenic purpura: Secondary | ICD-10-CM | POA: Diagnosis not present

## 2016-10-12 DIAGNOSIS — I2510 Atherosclerotic heart disease of native coronary artery without angina pectoris: Secondary | ICD-10-CM | POA: Diagnosis not present

## 2016-10-12 DIAGNOSIS — D72818 Other decreased white blood cell count: Secondary | ICD-10-CM | POA: Diagnosis not present

## 2016-10-12 DIAGNOSIS — I739 Peripheral vascular disease, unspecified: Secondary | ICD-10-CM | POA: Diagnosis not present

## 2016-10-16 ENCOUNTER — Encounter: Payer: Self-pay | Admitting: Family

## 2016-10-19 ENCOUNTER — Other Ambulatory Visit (HOSPITAL_COMMUNITY): Payer: BLUE CROSS/BLUE SHIELD

## 2016-10-19 ENCOUNTER — Ambulatory Visit: Payer: BLUE CROSS/BLUE SHIELD | Admitting: Family

## 2016-10-19 ENCOUNTER — Encounter (HOSPITAL_COMMUNITY): Payer: BLUE CROSS/BLUE SHIELD

## 2016-10-19 ENCOUNTER — Encounter: Payer: Self-pay | Admitting: Family

## 2016-10-26 ENCOUNTER — Other Ambulatory Visit: Payer: Self-pay | Admitting: Surgery

## 2016-10-26 ENCOUNTER — Ambulatory Visit (INDEPENDENT_AMBULATORY_CARE_PROVIDER_SITE_OTHER): Payer: Medicare HMO | Admitting: Family

## 2016-10-26 ENCOUNTER — Ambulatory Visit (HOSPITAL_COMMUNITY)
Admission: RE | Admit: 2016-10-26 | Discharge: 2016-10-26 | Disposition: A | Discharge status: Home / Self Care | Payer: Medicare HMO | Source: Ambulatory Visit | Attending: Surgery | Admitting: Surgery

## 2016-10-26 ENCOUNTER — Encounter: Payer: Self-pay | Admitting: Family

## 2016-10-26 VITALS — BP 163/93 | HR 120 | Temp 97.0°F | Resp 16 | Ht 70.0 in | Wt 136.0 lb

## 2016-10-26 DIAGNOSIS — I739 Peripheral vascular disease, unspecified: Secondary | ICD-10-CM

## 2016-10-26 DIAGNOSIS — I714 Abdominal aortic aneurysm, without rupture, unspecified: Secondary | ICD-10-CM

## 2016-10-26 DIAGNOSIS — Z8679 Personal history of other diseases of the circulatory system: Secondary | ICD-10-CM | POA: Diagnosis not present

## 2016-10-26 DIAGNOSIS — Z48812 Encounter for surgical aftercare following surgery on the circulatory system: Secondary | ICD-10-CM

## 2016-10-26 DIAGNOSIS — I7409 Other arterial embolism and thrombosis of abdominal aorta: Secondary | ICD-10-CM

## 2016-10-26 DIAGNOSIS — I6523 Occlusion and stenosis of bilateral carotid arteries: Secondary | ICD-10-CM | POA: Insufficient documentation

## 2016-10-26 DIAGNOSIS — R9439 Abnormal result of other cardiovascular function study: Secondary | ICD-10-CM | POA: Diagnosis not present

## 2016-10-26 DIAGNOSIS — Z87891 Personal history of nicotine dependence: Secondary | ICD-10-CM

## 2016-10-26 DIAGNOSIS — Z95828 Presence of other vascular implants and grafts: Secondary | ICD-10-CM | POA: Diagnosis not present

## 2016-10-26 LAB — VAS US CAROTID
LCCADDIAS: -17 cm/s
LCCAPDIAS: 23 cm/s
LCCAPSYS: 132 cm/s
LEFT ECA DIAS: -19 cm/s
LEFT VERTEBRAL DIAS: 12 cm/s
LICADDIAS: -39 cm/s
LICADSYS: -100 cm/s
LICAPSYS: -69 cm/s
Left CCA dist sys: -80 cm/s
Left ICA prox dias: -19 cm/s
RIGHT CCA MID DIAS: 21 cm/s
RIGHT ECA DIAS: -18 cm/s
Right CCA prox dias: 14 cm/s
Right CCA prox sys: 87 cm/s
Right cca dist sys: -70 cm/s

## 2016-10-26 NOTE — Progress Notes (Signed)
[facility] [place]  CC: Follow up Open AAA Repair and bilateral external iliac artery occlusive disease  History of Present Illness  Adam Deshotel Sr. is a 66 y.o. ([birth year]) male patient of Dr. Trula Slade who is s/p open repair of an abdominal aortic aneurysm on 05-30-15. Dr. Trula Slade did an aortobifemoral bypass graft using an 18 x 9 bifurcated graft because pt had severe iliac occlusive disease as well. His postoperative course was uncomplicated.  Pt denies any claudication symptoms with walking; states the bilateral calf claudication that he was having preoperatively has resolved since the above surgery.  He rides a stationary bike or walks 45 minutes daily.  Pt denies any back or abdominal pain.   Dr. Trula Slade last evaluated pt on 10-14-15. At that time he advised pt to follow-up in one year to evaluate his carotid arteries as he had mild stenosis in the past.  In addition he will need ABIs as well as a duplex of his aortobifemoral bypass graft.  Dr. Trula Slade also scheduled him for aneurysm ultrasound in 3 years.  He states his blood pressure at home is about 120/84.    Pt Diabetic: No Pt smoker: former smoker, quit in 2013, smoked x 45 years  Past Medical History:  Diagnosis Date  . Abdominal aortic aneurysm ([place])   . Congestive heart failure ([place])   . Coronary artery disease   . History of hiatal hernia   . HTN (hypertension) 05/28/2012  . Hypertension   . NSTEMI (non-ST elevated myocardial infarction). 05/28/12 05/28/2012  . Peripheral arterial disease ([place])   . Shortness of breath dyspnea   . Thrombocytopenia ([place]) 05/29/2012  . Tobacco abuse 05/28/2012    Past Surgical History:  Procedure Laterality Date  . AORTA - BILATERAL FEMORAL ARTERY BYPASS GRAFT Bilateral 05/30/2015   Procedure: AORTOBIFEMORAL BYPASS GRAFT;  Surgeon: Serafina Mitchell, MD;  Location: [place];  Service: Vascular;  Laterality: Bilateral;  . CARDIAC CATHETERIZATION    . CORONARY STENT  PLACEMENT    . HERNIA REPAIR    . LEFT HEART CATHETERIZATION WITH CORONARY ANGIOGRAM N/A 05/30/2012   Procedure: LEFT HEART CATHETERIZATION WITH CORONARY ANGIOGRAM;  Surgeon: Troy Sine, MD;  Location: [facility] CATH LAB;  Service: Cardiovascular;  Laterality: N/A;   Social History Social History   Social History  . Marital status: Married    Spouse name: N/A  . Number of children: 2  . Years of education: N/A   Occupational History  . Engineer, building services    Social History Main Topics  . Smoking status: Former Smoker    Packs/day: 1.00    Years: 45.00    Types: Cigarettes    Quit date: 07/08/2012  . Smokeless tobacco: Never Used  . Alcohol use 8.4 oz/week    14 Cans of beer per week     Comment: 6 pack weekly of beer  . Drug use: No  . Sexual activity: Not on file   Other Topics Concern  . Not on file   Social History Narrative  . No narrative on file   Family History Family History  Problem Relation Age of Onset  . Coronary artery disease Mother   . Heart disease Mother   . Heart attack Mother   . Peripheral vascular disease Mother   . AAA (abdominal aortic aneurysm) Mother   . Coronary artery disease Sister    Current Outpatient Prescriptions on File Prior to Visit  Medication Sig Dispense Refill  . aspirin 81 MG tablet  Take 81 mg by mouth daily.    Marland Kitchen atorvastatin (LIPITOR) 40 MG tablet Take 1 tablet (40 mg total) by mouth daily at 6 PM. 90 tablet 3  . carvedilol (COREG) 12.5 MG tablet Take 1.5 tablets (18.75 mg total) by mouth 2 (two) times daily with a meal. 270 tablet 3  . ipratropium (ATROVENT HFA) 17 MCG/ACT inhaler Inhale 1-2 puffs into the lungs every 6 (six) hours as needed for wheezing. 1 Inhaler 2  . levalbuterol (XOPENEX) 0.63 MG/3ML nebulizer solution Take 3 mLs (0.63 mg total) by nebulization every 6 (six) hours as needed for wheezing or shortness of breath. 3 mL 12  . levofloxacin (LEVAQUIN) 500 MG tablet Take 1 tablet (500 mg total) by mouth daily. 7  tablet 0  . lisinopril (PRINIVIL,ZESTRIL) 20 MG tablet Take 1 tablet (20 mg total) by mouth daily. 90 tablet 3  . nitroGLYCERIN (NITROSTAT) 0.4 MG SL tablet Place 1 tablet (0.4 mg total) under the tongue every 5 (five) minutes as needed for chest pain (up to 3 tabs in 15 mins). 25 tablet 6  . psyllium (METAMUCIL) 58.6 % powder Take 1 packet by mouth daily.    . traMADol (ULTRAM) 50 MG tablet Take 1 tablet (50 mg total) by mouth every 6 (six) hours as needed. 30 tablet 0  . zolpidem (AMBIEN) 5 MG tablet Take 1 tablet (5 mg total) by mouth at bedtime as needed for sleep. 20 tablet 0   No current facility-administered medications on file prior to visit.    Allergies  Allergen Reactions  . Chlorhexidine Itching and Rash  . Codeine Itching and Rash    ROS: See HPI for pertinent positives and negatives.    Physical Examination  Vitals:   10/26/16 1048 10/26/16 1050  BP: (!) 151/94 (!) 163/93  Pulse: (!) 120   Resp: 16   Temp: 97 F (36.1 C)   TempSrc: Oral   SpO2: 100%   Weight: 136 lb (61.7 kg)   Height: '5\' 10"'$  (1.778 m)    Body mass index is 19.51 kg/m.  General: A&O x 3, WD thin male.  Pulmonary: Sym exp, respirations are non labored, good air movt, CTAB, no rales, rhonchi, or wheezing.   Cardiac: RRR, Nl S1, S2, no detected murmur.   Carotid Bruits Right Left   Negative Negative    Aorta is not palpable. Radial pulses are 2+ right and 1+ left palpable.                         VASCULAR EXAM: Extremities without ischemic changes, without Gangrene; without open wounds.                                                                                                          LE Pulses Right Left       FEMORAL  3+ palpable  3+ palpable        POPLITEAL  not palpable   not palpable       POSTERIOR TIBIAL  2+ palpable  2+ palpable        DORSALIS PEDIS      ANTERIOR TIBIAL 2+ palpable  2+ palpable      Gastrointestinal: soft, NTND, -G/R, - HSM, - palpable  masses, - CVAT B.  Musculoskeletal: M/S 5/5 throughout, Extremities without ischemic changes.  Neurologic: Pain and light touch intact in extremities, Motor exam as listed above.   Non-Invasive Vascular Imaging(Date: 10/26/16):  Today's carotid duplex suggests <40% bilateral ICA stenosis. Abnormal retrograde right vertebral artery, left is antegrade. Bilateral subclavian artery waveforms are normal.  Prior exam on 04-30-15 indicated higher velocities involving bilateral mid segments of the ICA's; could not attain during this exam.   Today's bilateral aortoiliac duplex demonstrates decreased visualization of the stent locations due to overlying bowel gas. Bilateral iliac stents with no focal increase of velocities.  No previous exam at this facility.   ABI today  Right: PTA 1.02, DPA 1.15, TBI 0.81 with triphasic waveforms.   Left: PTA 1.07, DPA 1.12, TBI 0.75 with triphasic waveforms.  06-21-15: Right: 1.1; Left: 1.1  Bilateral ABI's and TBI's remain normal with all triphasic waveforms.    Medical Decision Making   Adam Decou Sr. is a 66 y.o. male who is s/p open repair of an abdominal aortic aneurysm on 05-30-15 for severe iliac occlusive disease.  He has no back pain or abdominal pain. He no longer has claudication symptoms with walking.    I discussed with the patient the importance of surveillance.  The next ABI and bilateral aortoiliac duplex will be scheduled for 12 months. Carotid duplex in 2 years.   The patient will follow up with [place] in 12 months with these studies.  I discussed in depth with the patient the nature of atherosclerosis, and emphasized the importance of maximal medical management including strict control of blood pressure, blood glucose, and lipid levels, obtaining regular exercise, and cessation of smoking.    The patient is aware that without maximal medical management the underlying atherosclerotic disease process will progress, limiting the  benefit of any interventions.   Thank you for allowing [place] to participate in this patient's care.  Clemon Chambers, RN, MSN, FNP-C [facility] [place] Office: [phone]   Clinic Physician: Bridgett Larsson   10/26/2016, 2:53 PM

## 2016-10-26 NOTE — Patient Instructions (Addendum)
Peripheral Vascular Disease Peripheral vascular disease (PVD) is a disease of the blood vessels that are not part of your heart and brain. A simple term for PVD is poor circulation. In most cases, PVD narrows the blood vessels that carry blood from your heart to the rest of your body. This can result in a decreased supply of blood to your arms, legs, and internal organs, like your stomach or kidneys. However, it most often affects a person's lower legs and feet. There are two types of PVD.  Organic PVD. This is the more common type. It is caused by damage to the structure of blood vessels.  Functional PVD. This is caused by conditions that make blood vessels contract and tighten (spasm). Without treatment, PVD tends to get worse over time. PVD can also lead to acute ischemic limb. This is when an arm or limb suddenly has trouble getting enough blood. This is a medical emergency. Follow these instructions at home:  Take medicines only as told by your doctor.  Do not use any tobacco products, including cigarettes, chewing tobacco, or electronic cigarettes. If you need help quitting, ask your doctor.  Lose weight if you are overweight, and maintain a healthy weight as told by your doctor.  Eat a diet that is low in fat and cholesterol. If you need help, ask your doctor.  Exercise regularly. Ask your doctor for some good activities for you.  Take good care of your feet.  Wear comfortable shoes that fit well.  Check your feet often for any cuts or sores. Contact a doctor if:  You have cramps in your legs while walking.  You have leg pain when you are at rest.  You have coldness in a leg or foot.  Your skin changes.  You are unable to get or have an erection (erectile dysfunction).  You have cuts or sores on your feet that are not healing. Get help right away if:  Your arm or leg turns cold and blue.  Your arms or legs become red, warm, swollen, painful, or numb.  You have  chest pain or trouble breathing.  You suddenly have weakness in your face, arm, or leg.  You become very confused or you cannot speak.  You suddenly have a very bad headache.  You suddenly cannot see. This information is not intended to replace advice given to you by your health care provider. Make sure you discuss any questions you have with your health care provider. Document Released: 11/18/2009 Document Revised: 01/30/2016 Document Reviewed: 02/01/2014 Elsevier Interactive Patient Education  2017 [place].     Before your next abdominal ultrasound:  Take two Extra-Strength Gas-X capsules at bedtime the night before the test. Take another two Extra-Strength Gas-X capsules 3 hours before the test.

## 2016-10-29 NOTE — Addendum Note (Signed)
Addended by: Lianne Cure A on: 10/29/2016 04:58 PM   Modules accepted: Orders

## 2016-12-21 ENCOUNTER — Encounter: Payer: Self-pay | Admitting: Cardiology

## 2017-01-06 DIAGNOSIS — K006 Disturbances in tooth eruption: Secondary | ICD-10-CM | POA: Diagnosis not present

## 2017-02-04 ENCOUNTER — Ambulatory Visit: Admission: RE | Admit: 2017-02-04 | Payer: Medicare HMO | Source: Ambulatory Visit | Admitting: Gastroenterology

## 2017-02-04 ENCOUNTER — Encounter: Admission: RE | Payer: Self-pay | Source: Ambulatory Visit

## 2017-02-04 SURGERY — COLONOSCOPY WITH PROPOFOL
Anesthesia: General

## 2017-02-25 ENCOUNTER — Other Ambulatory Visit: Payer: Self-pay | Admitting: Cardiovascular Disease

## 2017-02-25 NOTE — Telephone Encounter (Signed)
Rx(s) sent to pharmacy electronically.  

## 2017-04-16 ENCOUNTER — Encounter: Payer: Self-pay | Admitting: Cardiovascular Disease

## 2017-04-16 ENCOUNTER — Ambulatory Visit (INDEPENDENT_AMBULATORY_CARE_PROVIDER_SITE_OTHER): Payer: Medicare HMO | Admitting: Cardiovascular Disease

## 2017-04-16 VITALS — BP 122/72 | HR 68 | Ht 70.5 in | Wt 133.4 lb

## 2017-04-16 DIAGNOSIS — I2510 Atherosclerotic heart disease of native coronary artery without angina pectoris: Secondary | ICD-10-CM

## 2017-04-16 DIAGNOSIS — Z95828 Presence of other vascular implants and grafts: Secondary | ICD-10-CM

## 2017-04-16 DIAGNOSIS — I714 Abdominal aortic aneurysm, without rupture, unspecified: Secondary | ICD-10-CM

## 2017-04-16 DIAGNOSIS — I10 Essential (primary) hypertension: Secondary | ICD-10-CM

## 2017-04-16 DIAGNOSIS — E785 Hyperlipidemia, unspecified: Secondary | ICD-10-CM | POA: Diagnosis not present

## 2017-04-16 NOTE — Progress Notes (Signed)
Patient ID: Adam Lopez., male   DOB: [birth year], 66 y.o.   MRN: [id number]    Primary M.D.: None Vascular surgery: Dr. Trula Lopez  HPI: Adam Lopez a 66 y.o. male who presents to the office for a 12 month cardiology evaluation.   Adam Lopez of hematologic problems including leukopenia and thrombocytopenia and presented to [facility] on 05/30/2012 with a NSTEMI. Acute catheterization revealed 99% stenosis with significant thrombus in the RCA. He had significant coronary calcification. He underwent insertion of a 3.0x20 mm bare metal vision stent in the RCA post dilated 3.25 mm. He Lopez a  documented fusiform abdominal aneurysm at 4.1 x 3.9 cm in his distal aorta with small amount of mural thrombus. He also Lopez mild/moderate disease in the iliac arteries bilaterally. He does note some left leg claudication his Lopez of tobacco use, hyperlipidemia, and hypertension.  He Lopez had issues with labile hypertension.  He Lopez been taking lisinopril 15 mg daily, carvedilol 12.5 mg twice a day for blood pressure control.  He Lopez been on atorvastatin 40 mg for hyperlipidemia.  He no longer Lopez on dual antiplatelet therapy, but continues with aspirin 81 mg.  He admits to occasional cramps in his legs and feet.  He Lopez a documented abdominal aortic aneurysm.  He denies recent chest pain.  He denies PND or apnea.  He Lopez not had recent blood work.  He Lopez a Lopez of an abdominal aortic aneurysm and in January 2015 measured 4.4 x 4.2 cm.  On his abdominal ultrasound of 02/28/15, this had increased now to 5.3 x 5.5 cm.  However, on the same day.  he also had lower extremity Doppler studies and on that evaluation.  His fusiform distal abdominal aortic aneurysm measured 5.05.1 cm.  His ABIs had declined from one year previously and now was 0.86 on the left and 0.98 on the right.  He had increased left distal EIA velocity of 38 9 cm/s.  He does have claudication symptoms with  these have slightly improved recently compared to several months ago.  He underwent open repair of his abdominal aortic aneurysm on 05/30/2015 by Dr. Trula Lopez.  He had an aortobifemoral bypass graft inserted which was an 18 x 9 bifurcation graft because of severe iliac occlusive disease.  Since I last saw him one year ago, he underwent carotid duplex imaging.  If every 2018 which showed mild 1 at 39% bilateral internal carotid plaque.  There was abnormal.  Retrograde right vertebral artery flow.  A follow-up 8 or 2 iliac duplex evaluation of every 19 2018 suggested.  Patent bilateral iliac stents with no focal increase in the bilateral iliac arteries.  ABIs were 1.15 and 1.12 in the right and left lower extremity. He Lopez a Lopez of easy bruisability and for this reason Lopez no longer been on dual and a platelet therapy, but just aspirin alone.  He denies recent chest pain, PND orthopnea.  He denies any palpitations.  He Lopez a former smoker but quit tobacco in 2012.  He presents for follow-up evaluation.  Past Medical Lopez:  Diagnosis Date  . Abdominal aortic aneurysm ([place])   . Congestive heart failure ([place])   . Coronary artery disease   . Lopez of hiatal hernia   . HTN (hypertension) 05/28/2012  . Hypertension   . NSTEMI (non-ST elevated myocardial infarction). 05/28/12 05/28/2012  . Peripheral arterial disease ([place])   . Shortness of breath dyspnea   . Thrombocytopenia (  [name]) 05/29/2012  . Tobacco abuse 05/28/2012    Past Surgical Lopez:  Procedure Laterality Date  . AORTA - BILATERAL FEMORAL ARTERY BYPASS GRAFT Bilateral 05/30/2015   Procedure: AORTOBIFEMORAL BYPASS GRAFT;  Surgeon: Adam Mitchell, MD;  Location: [facility];  Service: Vascular;  Laterality: Bilateral;  . CARDIAC CATHETERIZATION    . CORONARY STENT PLACEMENT    . HERNIA REPAIR    . LEFT HEART CATHETERIZATION WITH CORONARY ANGIOGRAM N/A 05/30/2012   Procedure: LEFT HEART CATHETERIZATION WITH CORONARY ANGIOGRAM;  Surgeon: Adam Sine, MD;  Location: [facility] CATH LAB;  Service: Cardiovascular;  Laterality: N/A;    Allergies  Allergen Reactions  . Chlorhexidine Itching and Rash  . Codeine Itching and Rash    Current Outpatient Prescriptions  Medication Sig Dispense Refill  . aspirin 81 MG tablet Take 81 mg by mouth daily.    Marland Kitchen atorvastatin (LIPITOR) 40 MG tablet TAKE 1 TABLET EVERY DAY  AT  6PM 90 tablet 1  . carvedilol (COREG) 12.5 MG tablet TAKE 1 AND 1/2 TABLETS (18.75 MG TOTAL) BY MOUTH TWICE DAILY WITH MEALS. 270 tablet 1  . ipratropium (ATROVENT HFA) 17 MCG/ACT inhaler Inhale 1-2 puffs into the lungs every 6 (six) hours as needed for wheezing. 1 Inhaler 2  . levalbuterol (XOPENEX) 0.63 MG/3ML nebulizer solution Take 3 mLs (0.63 mg total) by nebulization every 6 (six) hours as needed for wheezing or shortness of breath. 3 mL 12  . levofloxacin (LEVAQUIN) 500 MG tablet Take 1 tablet (500 mg total) by mouth daily. 7 tablet 0  . lisinopril (PRINIVIL,ZESTRIL) 20 MG tablet TAKE 1 TABLET (20 MG TOTAL) BY MOUTH DAILY. 90 tablet 1  . nitroGLYCERIN (NITROSTAT) 0.4 MG SL tablet Place 1 tablet (0.4 mg total) under the tongue every 5 (five) minutes as needed for chest pain (up to 3 tabs in 15 mins). 25 tablet 6  . psyllium (METAMUCIL) 58.6 % powder Take 1 packet by mouth daily.    . traMADol (ULTRAM) 50 MG tablet Take 1 tablet (50 mg total) by mouth every 6 (six) hours as needed. 30 tablet 0  . zolpidem (AMBIEN) 5 MG tablet Take 1 tablet (5 mg total) by mouth at bedtime as needed for sleep. 20 tablet 0   No current facility-administered medications for this visit.     Social Lopez   Social Lopez  . Marital status: Married    Spouse name: N/A  . Number of children: 2  . Years of education: N/A   Occupational Lopez  . Engineer, building services    Social Lopez Main Topics  . Smoking status: Former Smoker    Packs/day: 1.00    Years: 45.00    Types: Cigarettes    Quit date: 07/08/2012  . Smokeless tobacco: Never  Used  . Alcohol use 8.4 oz/week    14 Cans of beer per week     Comment: 6 pack weekly of beer  . Drug use: No  . Sexual activity: Not on file   Other Topics Concern  . Not on file   Social Lopez Narrative  . 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  Family Lopez  Problem Relation Age of Onset  . Coronary artery disease Mother   . Heart disease Mother   . Heart attack Mother   . Peripheral vascular disease Mother   . AAA (abdominal aortic aneurysm) Mother   . Coronary artery disease Sister    Social Lopez married Lopez  2 children one grandchild. He does try to exercise. There Lopez a remote tobacco Lopez but he quit many years ago. He does drink occasional alcohol.  ROS General: Negative; No fevers, chills, or night sweats;  HEENT: Negative; No changes in vision or hearing, sinus congestion, difficulty swallowing Pulmonary: Negative; No cough, wheezing, shortness of breath, hemoptysis Cardiovascular: See Lopez of present illness GI: Negative; No nausea, vomiting, diarrhea, or abdominal pain GU: Negative; No dysuria, hematuria, or difficulty voiding Musculoskeletal: Leg cramps with possible claudication, left leg. Hematologic/Oncology: Negative; no easy bruising, bleeding Endocrine: Negative; no heat/cold intolerance; no diabetes Neuro: Negative; no changes in balance, headaches Skin: Negative; No rashes or skin lesions Psychiatric: Negative; No behavioral problems, depression Sleep: Negative; No snoring, daytime sleepiness, hypersomnolence, bruxism, restless legs, hypnogognic hallucinations, no cataplexy Other comprehensive 14 point system review Lopez negative.   PE BP 122/72   Pulse 68   Ht 5' 10.5" (1.791 m)   Wt 133 lb 6.4 oz (60.5 kg)   BMI 18.87 kg/m    Repeat blood pressure by me was 132/70  Wt Readings from Last 3 Encounters:  04/16/17 133 lb 6.4 oz (60.5 kg)  10/26/16 136 lb (61.7 kg)  03/30/16 128 lb (58.1 kg)      Physical Exam BP 122/72   Pulse 68   Ht 5' 10.5" (1.791 m)   Wt 133 lb 6.4 oz (60.5 kg)   BMI 18.87 kg/m  General: Alert, oriented, no distress.  Skin: normal turgor, no rashes, warm and dry HEENT: Normocephalic, atraumatic. Pupils equal round and reactive to light; sclera anicteric; extraocular muscles intact;  Nose without nasal septal hypertrophy Mouth/Parynx benign; Mallinpatti scale 3 Neck: No JVD, no carotid bruits; normal carotid upstroke Lungs: clear to ausculatation and percussion; no wheezing or rales Chest wall: without tenderness to palpitation Heart: PMI not displaced, RRR, s1 s2 normal, 1/6 systolic murmur, no diastolic murmur, no rubs, gallops, thrills, or heaves Abdomen: Well-healed surgical scars from his abdominal aortic aneurysm surgery.  soft, nontender; no hepatosplenomehaly, BS+; abdominal aorta nontender and not dilated by palpation. Back: no CVA tenderness Pulses 2+ Musculoskeletal: full range of motion, normal strength, no joint deformities Extremities: no clubbing cyanosis or edema, Homan's sign negative  Neurologic: grossly nonfocal; Cranial nerves grossly wnl Psychologic: Normal mood and affect   ECG (independently read by me): Normal sinus rhythm at 68 bpm.  LVH by voltage criteria.  Nonspecific T-wave changes.  July 2017 ECG (independently read by me): Normal sinus rhythm at 82 bpm.  Q wave in lead aVL.  QRS complex V1 V2.  July 2016 ECG (independently read by me): Sinus rhythm at 62 bpm.  LVH.  QRS complex V1 V2 and prominent Q wave in aVL.  May 2016 ECG (independently read by me): Normal sinus rhythm at 65 bpm.  Possible LVH by voltage.  Scalloping of ST segments inferolaterally.  Not significantly hanged.  October 2014 ECG: Sinus rhythm at 61 beats per minute. A prominent Q wave in V1 and V 2 with R wave in V2.  Nonspecific ST changes.  LABS: BMP Latest Ref Rng & Units 06/06/2015 06/02/2015 05/31/2015  Glucose 65 - 99 mg/dL - 88 180(H)  BUN  6 - 20 mg/dL - 12 16  Creatinine 0.61 - 1.24 mg/dL 1.26(H) 1.11 1.40(H)  Sodium 135 - 145 mmol/L - 135 126(L)  Potassium 3.5 - 5.1 mmol/L - 4.1 4.8  Chloride 101 - 111 mmol/L - 105 100(L)  CO2 22 - 32 mmol/L - 22 22  Calcium 8.9 - 10.3 mg/dL - 8.4(L) 7.8(L)   Hepatic Function Latest Ref Rng & Units 05/31/2015 05/21/2015 01/29/2015  Total Protein 6.5 - 8.1 g/dL 5.2(L) 7.2 7.9  Albumin 3.5 - 5.0 g/dL 2.9(L) 4.0 4.0  AST 15 - 41 U/L 35 40 27  ALT 17 - 63 U/L _0 Alk Phosphatase 38 - 126 U/L 42 75 95  Total Bilirubin 0.3 - 1.2 mg/dL 0.9 0.8 0.7   CBC Latest Ref Rng & Units 06/02/2015 05/31/2015 05/30/2015  WBC 4.0 - 10.5 K/uL 9.3 8.8 9.9  Hemoglobin 13.0 - 17.0 g/dL 10.2(L) 9.0(L) 10.4(L)  Hematocrit 39.0 - 52.0 % 30.5(L) 26.1(L) 31.2(L)  Platelets 150 - 400 K/uL 119(L) 108(L) 132(L)   Lab Results  Component Value Date   TSH 3.611 01/29/2015   Lab Results  Component Value Date   HGBA1C 5.0 05/28/2012   Lipid Panel     Component Value Date/Time   CHOL 169 01/29/2015 0846   TRIG 81 01/29/2015 0846   HDL 69 01/29/2015 0846   CHOLHDL 2.4 01/29/2015 0846   VLDL 16 01/29/2015 0846   LDLCALC 84 01/29/2015 0846    RADIOLOGY: No results found.  IMPRESSION:  1. Essential hypertension   2. Coronary artery disease involving native coronary artery of native heart without angina pectoris   3. Hyperlipidemia with target LDL less than 70   4. AAA (abdominal aortic aneurysm) without rupture ([place])   5. S/P aortobifemoral bypass surgery     ASSESSMENT AND PLAN: Adam Lopez a 66 year old gentleman who suffered a NSTEMI and underwent successful intervention to subtotal RCA with significant thrombus burden on 05/30/2012.Marland Kitchen He had been on Effient for one year but stopped taking this in 2014. He developed progressive enlargement of his abdominal aortic aneurysm and underwent successful surgery with placement of an aortobifemoral bypass graft in September 2016.  Presently, his blood  pressure Lopez upper normal on lisinopril 20 mg daily in addition to carvedilol 18.75 mg twice a day.  His resting pulse Lopez in the 60s.  He's not having any anginal symptoms.  He denies palpitations. He continues to be on atorvastatin 40 mg daily; target LDL Lopez less than 70.  He's not had recent laboratory and this will be reassessed.  He developed significant increased bruisability in the past with dual antiplatelet therapy and now continues to just be on aspirin alone.  His asthma Lopez controlled with Xopenex and Atrovent and at present there Lopez no wheezing. He Lopez remote tobacco user, but  quit smoking in 2012.  I will contact him regarding his follow-up laboratory.  He will be seeing Dr. Trula Lopez in February 2019, and I will see him in one year for cardiology evaluation   Time spent: 25 minutes  Adam Sine, MD, [facility]  04/17/2017 1:22 PM

## 2017-04-16 NOTE — Patient Instructions (Signed)
Medication Instructions:  Your physician recommends that you continue on your current medications as directed. Please refer to the Current Medication list given to you today.  Labwork: Please return for FASTING lab work (CBC, CMET, Lipids, TSH).  Testing/Procedures: NONE  Follow-Up: Your physician wants you to follow-up in: 1 year with Dr. Claiborne Billings. You will receive a reminder letter in the mail two months in advance. If you don't receive a letter, please call our office to schedule the follow-up appointment.   Any Other Special Instructions Will Be Listed Below (If Applicable).     If you need a refill on your cardiac medications before your next appointment, please call your pharmacy.

## 2017-04-19 NOTE — Addendum Note (Signed)
Addended by: Zebedee Iba on: 04/19/2017 08:46 AM   Modules accepted: Orders

## 2017-04-22 NOTE — Addendum Note (Signed)
Addended by: Zebedee Iba on: 04/22/2017 09:16 AM   Modules accepted: Orders

## 2017-07-18 ENCOUNTER — Emergency Department (HOSPITAL_COMMUNITY): Payer: Medicare HMO

## 2017-07-18 ENCOUNTER — Inpatient Hospital Stay (HOSPITAL_COMMUNITY): Payer: Medicare HMO

## 2017-07-18 ENCOUNTER — Encounter (HOSPITAL_COMMUNITY): Payer: Self-pay

## 2017-07-18 DIAGNOSIS — I214 Non-ST elevation (NSTEMI) myocardial infarction: Secondary | ICD-10-CM | POA: Diagnosis not present

## 2017-07-18 DIAGNOSIS — Z7901 Long term (current) use of anticoagulants: Secondary | ICD-10-CM | POA: Diagnosis not present

## 2017-07-18 DIAGNOSIS — I472 Ventricular tachycardia: Secondary | ICD-10-CM | POA: Diagnosis not present

## 2017-07-18 DIAGNOSIS — R001 Bradycardia, unspecified: Secondary | ICD-10-CM

## 2017-07-18 DIAGNOSIS — Z9911 Dependence on respirator [ventilator] status: Secondary | ICD-10-CM | POA: Diagnosis not present

## 2017-07-18 DIAGNOSIS — I739 Peripheral vascular disease, unspecified: Secondary | ICD-10-CM | POA: Diagnosis not present

## 2017-07-18 DIAGNOSIS — R339 Retention of urine, unspecified: Secondary | ICD-10-CM | POA: Diagnosis not present

## 2017-07-18 DIAGNOSIS — C33 Malignant neoplasm of trachea: Secondary | ICD-10-CM | POA: Diagnosis not present

## 2017-07-18 DIAGNOSIS — I480 Paroxysmal atrial fibrillation: Secondary | ICD-10-CM | POA: Diagnosis not present

## 2017-07-18 DIAGNOSIS — R579 Shock, unspecified: Secondary | ICD-10-CM | POA: Diagnosis not present

## 2017-07-18 DIAGNOSIS — Z7982 Long term (current) use of aspirin: Secondary | ICD-10-CM

## 2017-07-18 DIAGNOSIS — J9600 Acute respiratory failure, unspecified whether with hypoxia or hypercapnia: Secondary | ICD-10-CM | POA: Diagnosis not present

## 2017-07-18 DIAGNOSIS — Z515 Encounter for palliative care: Secondary | ICD-10-CM | POA: Diagnosis not present

## 2017-07-18 DIAGNOSIS — K7200 Acute and subacute hepatic failure without coma: Secondary | ICD-10-CM | POA: Diagnosis not present

## 2017-07-18 DIAGNOSIS — F10231 Alcohol dependence with withdrawal delirium: Secondary | ICD-10-CM | POA: Diagnosis not present

## 2017-07-18 DIAGNOSIS — J156 Pneumonia due to other aerobic Gram-negative bacteria: Secondary | ICD-10-CM | POA: Diagnosis present

## 2017-07-18 DIAGNOSIS — Z66 Do not resuscitate: Secondary | ICD-10-CM | POA: Diagnosis not present

## 2017-07-18 DIAGNOSIS — E874 Mixed disorder of acid-base balance: Secondary | ICD-10-CM | POA: Diagnosis present

## 2017-07-18 DIAGNOSIS — C3411 Malignant neoplasm of upper lobe, right bronchus or lung: Secondary | ICD-10-CM | POA: Diagnosis present

## 2017-07-18 DIAGNOSIS — N183 Chronic kidney disease, stage 3 (moderate): Secondary | ICD-10-CM | POA: Diagnosis present

## 2017-07-18 DIAGNOSIS — I959 Hypotension, unspecified: Secondary | ICD-10-CM | POA: Diagnosis not present

## 2017-07-18 DIAGNOSIS — Z91048 Other nonmedicinal substance allergy status: Secondary | ICD-10-CM

## 2017-07-18 DIAGNOSIS — Y906 Blood alcohol level of 120-199 mg/100 ml: Secondary | ICD-10-CM | POA: Diagnosis present

## 2017-07-18 DIAGNOSIS — J9811 Atelectasis: Secondary | ICD-10-CM | POA: Diagnosis not present

## 2017-07-18 DIAGNOSIS — D631 Anemia in chronic kidney disease: Secondary | ICD-10-CM | POA: Diagnosis present

## 2017-07-18 DIAGNOSIS — C3431 Malignant neoplasm of lower lobe, right bronchus or lung: Secondary | ICD-10-CM | POA: Diagnosis present

## 2017-07-18 DIAGNOSIS — I2584 Coronary atherosclerosis due to calcified coronary lesion: Secondary | ICD-10-CM | POA: Diagnosis present

## 2017-07-18 DIAGNOSIS — Z789 Other specified health status: Secondary | ICD-10-CM | POA: Diagnosis not present

## 2017-07-18 DIAGNOSIS — R079 Chest pain, unspecified: Secondary | ICD-10-CM | POA: Diagnosis present

## 2017-07-18 DIAGNOSIS — J0410 Acute tracheitis without obstruction: Secondary | ICD-10-CM | POA: Diagnosis not present

## 2017-07-18 DIAGNOSIS — I469 Cardiac arrest, cause unspecified: Secondary | ICD-10-CM

## 2017-07-18 DIAGNOSIS — J9690 Respiratory failure, unspecified, unspecified whether with hypoxia or hypercapnia: Secondary | ICD-10-CM | POA: Diagnosis not present

## 2017-07-18 DIAGNOSIS — I361 Nonrheumatic tricuspid (valve) insufficiency: Secondary | ICD-10-CM | POA: Diagnosis not present

## 2017-07-18 DIAGNOSIS — G931 Anoxic brain damage, not elsewhere classified: Secondary | ICD-10-CM | POA: Diagnosis not present

## 2017-07-18 DIAGNOSIS — I701 Atherosclerosis of renal artery: Secondary | ICD-10-CM | POA: Diagnosis present

## 2017-07-18 DIAGNOSIS — Z885 Allergy status to narcotic agent status: Secondary | ICD-10-CM

## 2017-07-18 DIAGNOSIS — R918 Other nonspecific abnormal finding of lung field: Secondary | ICD-10-CM | POA: Diagnosis not present

## 2017-07-18 DIAGNOSIS — R0603 Acute respiratory distress: Secondary | ICD-10-CM

## 2017-07-18 DIAGNOSIS — N179 Acute kidney failure, unspecified: Secondary | ICD-10-CM | POA: Diagnosis not present

## 2017-07-18 DIAGNOSIS — G9340 Encephalopathy, unspecified: Secondary | ICD-10-CM | POA: Diagnosis not present

## 2017-07-18 DIAGNOSIS — R188 Other ascites: Secondary | ICD-10-CM | POA: Diagnosis not present

## 2017-07-18 DIAGNOSIS — E871 Hypo-osmolality and hyponatremia: Secondary | ICD-10-CM | POA: Diagnosis present

## 2017-07-18 DIAGNOSIS — J9602 Acute respiratory failure with hypercapnia: Secondary | ICD-10-CM

## 2017-07-18 DIAGNOSIS — E872 Acidosis: Secondary | ICD-10-CM | POA: Diagnosis not present

## 2017-07-18 DIAGNOSIS — R0602 Shortness of breath: Secondary | ICD-10-CM | POA: Diagnosis not present

## 2017-07-18 DIAGNOSIS — Z79899 Other long term (current) drug therapy: Secondary | ICD-10-CM

## 2017-07-18 DIAGNOSIS — J9691 Respiratory failure, unspecified with hypoxia: Secondary | ICD-10-CM | POA: Diagnosis not present

## 2017-07-18 DIAGNOSIS — F10931 Alcohol use, unspecified with withdrawal delirium: Secondary | ICD-10-CM

## 2017-07-18 DIAGNOSIS — Z87891 Personal history of nicotine dependence: Secondary | ICD-10-CM

## 2017-07-18 DIAGNOSIS — I5032 Chronic diastolic (congestive) heart failure: Secondary | ICD-10-CM | POA: Diagnosis present

## 2017-07-18 DIAGNOSIS — F419 Anxiety disorder, unspecified: Secondary | ICD-10-CM | POA: Diagnosis not present

## 2017-07-18 DIAGNOSIS — J989 Respiratory disorder, unspecified: Secondary | ICD-10-CM | POA: Diagnosis not present

## 2017-07-18 DIAGNOSIS — G9341 Metabolic encephalopathy: Secondary | ICD-10-CM | POA: Diagnosis present

## 2017-07-18 DIAGNOSIS — I10 Essential (primary) hypertension: Secondary | ICD-10-CM | POA: Diagnosis not present

## 2017-07-18 DIAGNOSIS — J942 Hemothorax: Secondary | ICD-10-CM | POA: Diagnosis not present

## 2017-07-18 DIAGNOSIS — R069 Unspecified abnormalities of breathing: Secondary | ICD-10-CM

## 2017-07-18 DIAGNOSIS — Z4682 Encounter for fitting and adjustment of non-vascular catheter: Secondary | ICD-10-CM | POA: Diagnosis not present

## 2017-07-18 DIAGNOSIS — I5031 Acute diastolic (congestive) heart failure: Secondary | ICD-10-CM | POA: Diagnosis not present

## 2017-07-18 DIAGNOSIS — R570 Cardiogenic shock: Secondary | ICD-10-CM | POA: Diagnosis present

## 2017-07-18 DIAGNOSIS — Z452 Encounter for adjustment and management of vascular access device: Secondary | ICD-10-CM | POA: Diagnosis not present

## 2017-07-18 DIAGNOSIS — K859 Acute pancreatitis without necrosis or infection, unspecified: Secondary | ICD-10-CM

## 2017-07-18 DIAGNOSIS — Z8249 Family history of ischemic heart disease and other diseases of the circulatory system: Secondary | ICD-10-CM

## 2017-07-18 DIAGNOSIS — Z01818 Encounter for other preprocedural examination: Secondary | ICD-10-CM | POA: Diagnosis not present

## 2017-07-18 DIAGNOSIS — I252 Old myocardial infarction: Secondary | ICD-10-CM

## 2017-07-18 DIAGNOSIS — S0990XA Unspecified injury of head, initial encounter: Secondary | ICD-10-CM | POA: Diagnosis not present

## 2017-07-18 DIAGNOSIS — R140 Abdominal distension (gaseous): Secondary | ICD-10-CM | POA: Diagnosis not present

## 2017-07-18 DIAGNOSIS — J9601 Acute respiratory failure with hypoxia: Secondary | ICD-10-CM | POA: Diagnosis not present

## 2017-07-18 DIAGNOSIS — I2510 Atherosclerotic heart disease of native coronary artery without angina pectoris: Secondary | ICD-10-CM | POA: Diagnosis not present

## 2017-07-18 DIAGNOSIS — K8020 Calculus of gallbladder without cholecystitis without obstruction: Secondary | ICD-10-CM | POA: Diagnosis not present

## 2017-07-18 DIAGNOSIS — E785 Hyperlipidemia, unspecified: Secondary | ICD-10-CM | POA: Diagnosis present

## 2017-07-18 DIAGNOSIS — Z0189 Encounter for other specified special examinations: Secondary | ICD-10-CM

## 2017-07-18 DIAGNOSIS — Z955 Presence of coronary angioplasty implant and graft: Secondary | ICD-10-CM

## 2017-07-18 DIAGNOSIS — J90 Pleural effusion, not elsewhere classified: Secondary | ICD-10-CM | POA: Diagnosis not present

## 2017-07-18 DIAGNOSIS — I130 Hypertensive heart and chronic kidney disease with heart failure and stage 1 through stage 4 chronic kidney disease, or unspecified chronic kidney disease: Secondary | ICD-10-CM | POA: Diagnosis not present

## 2017-07-18 DIAGNOSIS — R739 Hyperglycemia, unspecified: Secondary | ICD-10-CM | POA: Diagnosis present

## 2017-07-18 DIAGNOSIS — Z7189 Other specified counseling: Secondary | ICD-10-CM | POA: Diagnosis not present

## 2017-07-18 DIAGNOSIS — I440 Atrioventricular block, first degree: Secondary | ICD-10-CM | POA: Diagnosis not present

## 2017-07-18 DIAGNOSIS — Z95828 Presence of other vascular implants and grafts: Secondary | ICD-10-CM

## 2017-07-18 DIAGNOSIS — I25708 Atherosclerosis of coronary artery bypass graft(s), unspecified, with other forms of angina pectoris: Secondary | ICD-10-CM | POA: Diagnosis not present

## 2017-07-18 DIAGNOSIS — K7689 Other specified diseases of liver: Secondary | ICD-10-CM | POA: Diagnosis not present

## 2017-07-18 DIAGNOSIS — E7800 Pure hypercholesterolemia, unspecified: Secondary | ICD-10-CM | POA: Diagnosis not present

## 2017-07-18 DIAGNOSIS — Z9289 Personal history of other medical treatment: Secondary | ICD-10-CM

## 2017-07-18 LAB — URINALYSIS, ROUTINE W REFLEX MICROSCOPIC
Bilirubin Urine: NEGATIVE
GLUCOSE, UA: NEGATIVE mg/dL
KETONES UR: NEGATIVE mg/dL
Leukocytes, UA: NEGATIVE
Nitrite: NEGATIVE
PROTEIN: 30 mg/dL — AB
Specific Gravity, Urine: 1.005 (ref 1.005–1.030)
pH: 6 (ref 5.0–8.0)

## 2017-07-18 LAB — I-STAT ARTERIAL BLOOD GAS, ED
ACID-BASE DEFICIT: 10 mmol/L — AB (ref 0.0–2.0)
ACID-BASE DEFICIT: 18 mmol/L — AB (ref 0.0–2.0)
BICARBONATE: 9.6 mmol/L — AB (ref 20.0–28.0)
Bicarbonate: 14.2 mmol/L — ABNORMAL LOW (ref 20.0–28.0)
O2 SAT: 100 %
O2 SAT: 100 %
PH ART: 7.353 (ref 7.350–7.450)
PH ART: 7.138 — AB (ref 7.350–7.450)
PO2 ART: 454 mmHg — AB (ref 83.0–108.0)
TCO2: 15 mmol/L — ABNORMAL LOW (ref 22–32)
TCO2: 10 mmol/L — ABNORMAL LOW (ref 22–32)
pCO2 arterial: 25.5 mmHg — ABNORMAL LOW (ref 32.0–48.0)
pCO2 arterial: 28.4 mmHg — ABNORMAL LOW (ref 32.0–48.0)
pO2, Arterial: 282 mmHg — ABNORMAL HIGH (ref 83.0–108.0)

## 2017-07-18 LAB — COMPREHENSIVE METABOLIC PANEL
ALBUMIN: 2.8 g/dL — AB (ref 3.5–5.0)
ALK PHOS: 123 U/L (ref 38–126)
ALT: 38 U/L (ref 17–63)
AST: 86 U/L — AB (ref 15–41)
Anion gap: 10 (ref 5–15)
BILIRUBIN TOTAL: 0.7 mg/dL (ref 0.3–1.2)
BUN: 13 mg/dL (ref 6–20)
CALCIUM: 8.1 mg/dL — AB (ref 8.9–10.3)
CO2: 19 mmol/L — ABNORMAL LOW (ref 22–32)
CREATININE: 1.39 mg/dL — AB (ref 0.61–1.24)
Chloride: 98 mmol/L — ABNORMAL LOW (ref 101–111)
GFR calc Af Amer: 59 mL/min — ABNORMAL LOW (ref >60)
GFR, EST NON AFRICAN AMERICAN: 51 mL/min — AB (ref >60)
GLUCOSE: 100 mg/dL — AB (ref 65–99)
POTASSIUM: 4.7 mmol/L (ref 3.5–5.1)
Sodium: 127 mmol/L — ABNORMAL LOW (ref 135–145)
TOTAL PROTEIN: 6.3 g/dL — AB (ref 6.5–8.1)

## 2017-07-18 LAB — BASIC METABOLIC PANEL
Anion gap: 13 (ref 5–15)
BUN: 13 mg/dL (ref 6–20)
CO2: 13 mmol/L — ABNORMAL LOW (ref 22–32)
CREATININE: 1.46 mg/dL — AB (ref 0.61–1.24)
Calcium: 6 mg/dL — CL (ref 8.9–10.3)
Chloride: 102 mmol/L (ref 101–111)
GFR calc Af Amer: 56 mL/min — ABNORMAL LOW (ref >60)
GFR, EST NON AFRICAN AMERICAN: 48 mL/min — AB (ref >60)
GLUCOSE: 195 mg/dL — AB (ref 65–99)
Potassium: 5.3 mmol/L — ABNORMAL HIGH (ref 3.5–5.1)
SODIUM: 128 mmol/L — AB (ref 135–145)

## 2017-07-18 LAB — I-STAT CHEM 8, ED
BUN: 17 mg/dL (ref 6–20)
CALCIUM ION: 1.13 mmol/L — AB (ref 1.15–1.40)
CHLORIDE: 97 mmol/L — AB (ref 101–111)
CREATININE: 1.4 mg/dL — AB (ref 0.61–1.24)
GLUCOSE: 94 mg/dL (ref 65–99)
HCT: 42 % (ref 39.0–52.0)
Hemoglobin: 14.3 g/dL (ref 13.0–17.0)
Potassium: 5 mmol/L (ref 3.5–5.1)
Sodium: 129 mmol/L — ABNORMAL LOW (ref 135–145)
TCO2: 23 mmol/L (ref 22–32)

## 2017-07-18 LAB — LIPASE, BLOOD: Lipase: 42 U/L (ref 11–51)

## 2017-07-18 LAB — I-STAT TROPONIN, ED
TROPONIN I, POC: 0.25 ng/mL — AB (ref 0.00–0.08)
Troponin i, poc: 0.29 ng/mL (ref 0.00–0.08)

## 2017-07-18 LAB — COOXEMETRY PANEL
Carboxyhemoglobin: 0.7 % (ref 0.5–1.5)
Methemoglobin: 0.7 % (ref 0.0–1.5)
O2 Saturation: 98.8 %
Total hemoglobin: 10.6 g/dL — ABNORMAL LOW (ref 12.0–16.0)

## 2017-07-18 LAB — CBC
HEMATOCRIT: 36.5 % — AB (ref 39.0–52.0)
HEMOGLOBIN: 11.9 g/dL — AB (ref 13.0–17.0)
MCH: 31.6 pg (ref 26.0–34.0)
MCHC: 32.6 g/dL (ref 30.0–36.0)
MCV: 96.8 fL (ref 78.0–100.0)
Platelets: 246 10*3/uL (ref 150–400)
RBC: 3.77 MIL/uL — AB (ref 4.22–5.81)
RDW: 13.1 % (ref 11.5–15.5)
WBC: 7.6 10*3/uL (ref 4.0–10.5)

## 2017-07-18 LAB — I-STAT CG4 LACTIC ACID, ED
LACTIC ACID, VENOUS: 8.13 mmol/L — AB (ref 0.5–1.9)
Lactic Acid, Venous: 4.07 mmol/L (ref 0.5–1.9)

## 2017-07-18 LAB — PHOSPHORUS: Phosphorus: 5.7 mg/dL — ABNORMAL HIGH (ref 2.5–4.6)

## 2017-07-18 LAB — TYPE AND SCREEN
ABO/RH(D): A NEG
Antibody Screen: NEGATIVE

## 2017-07-18 LAB — PROTIME-INR
INR: 1.15
Prothrombin Time: 14.6 seconds (ref 11.4–15.2)

## 2017-07-18 LAB — APTT: APTT: 52 s — AB (ref 24–36)

## 2017-07-18 LAB — ETHANOL: Alcohol, Ethyl (B): 155 mg/dL — ABNORMAL HIGH (ref <10)

## 2017-07-18 LAB — FIBRINOGEN: FIBRINOGEN: 218 mg/dL (ref 210–475)

## 2017-07-18 LAB — MAGNESIUM: MAGNESIUM: 1.3 mg/dL — AB (ref 1.7–2.4)

## 2017-07-18 LAB — PROCALCITONIN

## 2017-07-18 LAB — POC OCCULT BLOOD, ED: FECAL OCCULT BLD: NEGATIVE

## 2017-07-18 MED ORDER — SODIUM CHLORIDE 0.9 % IV SOLN: 250.0000 mL | INTRAVENOUS | Status: DC | PRN

## 2017-07-18 MED ORDER — SODIUM CHLORIDE 0.9 % IV BOLUS (SEPSIS)
1000.0000 mL | Freq: Once | INTRAVENOUS | Status: AC
  Administered 2017-07-18: 1000 mL via INTRAVENOUS

## 2017-07-18 MED ORDER — IOPAMIDOL (ISOVUE-370) INJECTION 76%
INTRAVENOUS | Status: AC
  Administered 2017-07-18: 100 mL
  Filled 2017-07-18: qty 100

## 2017-07-18 MED ORDER — EPINEPHRINE PF 1 MG/ML IJ SOLN
1.0000 mg | Freq: Once | INTRAMUSCULAR | Status: AC
  Administered 2017-07-18: 1 mg via INTRAVENOUS

## 2017-07-18 MED ORDER — DOPAMINE-DEXTROSE 3.2-5 MG/ML-% IV SOLN
0.0000 ug/kg/min | INTRAVENOUS | Status: DC
  Administered 2017-07-18: 20 ug/kg/min via INTRAVENOUS
  Administered 2017-07-19: 5.039 ug/kg/min via INTRAVENOUS
  Administered 2017-07-19: 6 ug/kg/min via INTRAVENOUS
  Administered 2017-07-20: 5 ug/kg/min via INTRAVENOUS
  Administered 2017-07-22: 4 ug/kg/min via INTRAVENOUS
  Filled 2017-07-18 (×4): qty 250

## 2017-07-18 MED ORDER — PIPERACILLIN-TAZOBACTAM 3.375 G IVPB 30 MIN
3.3750 g | Freq: Once | INTRAVENOUS | Status: AC
  Administered 2017-07-18: 3.375 g via INTRAVENOUS
  Filled 2017-07-18: qty 50

## 2017-07-18 MED ORDER — SODIUM BICARBONATE 8.4 % IV SOLN
INTRAVENOUS | Status: AC
  Filled 2017-07-18: qty 50

## 2017-07-18 MED ORDER — NOREPINEPHRINE BITARTRATE 1 MG/ML IV SOLN
0.0000 ug/min | INTRAVENOUS | Status: DC
  Filled 2017-07-18: qty 4

## 2017-07-18 MED ORDER — PANTOPRAZOLE SODIUM 40 MG IV SOLR
40.0000 mg | Freq: Every day | INTRAVENOUS | Status: DC
  Administered 2017-07-19 (×2): 40 mg via INTRAVENOUS
  Filled 2017-07-18 (×2): qty 40

## 2017-07-18 MED ORDER — FOLIC ACID 5 MG/ML IJ SOLN
1.0000 mg | Freq: Every day | INTRAMUSCULAR | Status: DC
  Administered 2017-07-19 – 2017-07-28 (×9): 1 mg via INTRAVENOUS
  Filled 2017-07-18 (×11): qty 0.2

## 2017-07-18 MED ORDER — FENTANYL CITRATE (PF) 100 MCG/2ML IJ SOLN
25.0000 ug | INTRAMUSCULAR | Status: DC | PRN
  Administered 2017-07-18: 50 ug via INTRAVENOUS
  Filled 2017-07-18 (×2): qty 2

## 2017-07-18 MED ORDER — SODIUM BICARBONATE 8.4 % IV SOLN
50.0000 meq | Freq: Once | INTRAVENOUS | Status: AC
  Administered 2017-07-18: 50 meq via INTRAVENOUS

## 2017-07-18 MED ORDER — ETOMIDATE 2 MG/ML IV SOLN
INTRAVENOUS | Status: AC | PRN
  Administered 2017-07-18: 20 mg via INTRAVENOUS

## 2017-07-18 MED ORDER — FENTANYL CITRATE (PF) 100 MCG/2ML IJ SOLN
INTRAMUSCULAR | Status: AC
  Filled 2017-07-18: qty 2

## 2017-07-18 MED ORDER — EPINEPHRINE PF 1 MG/ML IJ SOLN
0.5000 ug/min | INTRAMUSCULAR | Status: DC
  Administered 2017-07-18: 4 ug/min via INTRAVENOUS
  Filled 2017-07-18 (×3): qty 4

## 2017-07-18 MED ORDER — "THROMBI-PAD 3""X3"" EX PADS"
1.0000 | MEDICATED_PAD | Freq: Once | CUTANEOUS | Status: DC
  Filled 2017-07-18: qty 1

## 2017-07-18 MED ORDER — ONDANSETRON HCL 4 MG/2ML IJ SOLN
4.0000 mg | Freq: Once | INTRAMUSCULAR | Status: DC
Start: 2017-07-18 — End: 2017-07-23

## 2017-07-18 MED ORDER — DEXTROSE 5 % IV SOLN
0.0000 ug/min | Freq: Once | INTRAVENOUS | Status: AC
  Administered 2017-07-18: 5 ug/min via INTRAVENOUS

## 2017-07-18 MED ORDER — VASOPRESSIN 20 UNIT/ML IV SOLN
0.0300 [IU]/min | INTRAVENOUS | Status: DC
  Administered 2017-07-19: 0.03 [IU]/min via INTRAVENOUS
  Filled 2017-07-18: qty 2

## 2017-07-18 MED ORDER — ATROPINE SULFATE 1 MG/ML IJ SOLN
INTRAMUSCULAR | Status: AC | PRN
  Administered 2017-07-18: 1 mg via INTRAVENOUS

## 2017-07-18 MED ORDER — SODIUM BICARBONATE 8.4 % IV SOLN
INTRAVENOUS | Status: DC
  Administered 2017-07-18 – 2017-07-19 (×3): via INTRAVENOUS
  Filled 2017-07-18 (×4): qty 150

## 2017-07-18 MED ORDER — VANCOMYCIN HCL IN DEXTROSE 1-5 GM/200ML-% IV SOLN: 1000.0000 mg | Freq: Once | INTRAVENOUS | Status: DC

## 2017-07-18 MED ORDER — SODIUM CHLORIDE 0.9 % IV SOLN
INTRAVENOUS | Status: DC
  Administered 2017-07-18 – 2017-07-22 (×3): via INTRAVENOUS

## 2017-07-18 MED ORDER — VANCOMYCIN HCL 10 G IV SOLR
1250.0000 mg | Freq: Once | INTRAVENOUS | Status: AC
  Administered 2017-07-18: 1250 mg via INTRAVENOUS
  Filled 2017-07-18: qty 1250

## 2017-07-18 MED ORDER — SUCCINYLCHOLINE CHLORIDE 20 MG/ML IJ SOLN
INTRAMUSCULAR | Status: AC | PRN
  Administered 2017-07-18: 100 mg via INTRAVENOUS

## 2017-07-18 MED ORDER — HYDROCORTISONE NA SUCCINATE PF 100 MG IJ SOLR
100.0000 mg | Freq: Once | INTRAMUSCULAR | Status: DC
  Administered 2017-07-19: 100 mg via INTRAVENOUS

## 2017-07-18 MED ORDER — FENTANYL CITRATE (PF) 100 MCG/2ML IJ SOLN
25.0000 ug | Freq: Once | INTRAMUSCULAR | Status: AC
  Administered 2017-07-18: 25 ug via INTRAVENOUS

## 2017-07-18 MED ORDER — THIAMINE HCL 100 MG/ML IJ SOLN
100.0000 mg | Freq: Every day | INTRAMUSCULAR | Status: DC
  Administered 2017-07-19 – 2017-07-25 (×6): 100 mg via INTRAVENOUS
  Administered 2017-07-26: 200 mg via INTRAVENOUS
  Administered 2017-07-27 – 2017-07-28 (×2): 100 mg via INTRAVENOUS
  Filled 2017-07-18 (×10): qty 2

## 2017-07-18 MED ORDER — SODIUM CHLORIDE 0.9 % IV SOLN
500.0000 mg | Freq: Two times a day (BID) | INTRAVENOUS | Status: DC
  Filled 2017-07-18: qty 500

## 2017-07-18 MED ORDER — IPRATROPIUM-ALBUTEROL 0.5-2.5 (3) MG/3ML IN SOLN
3.0000 mL | Freq: Four times a day (QID) | RESPIRATORY_TRACT | Status: DC
  Administered 2017-07-18 – 2017-07-30 (×46): 3 mL via RESPIRATORY_TRACT
  Filled 2017-07-18 (×48): qty 3

## 2017-07-18 MED ORDER — FENTANYL CITRATE (PF) 100 MCG/2ML IJ SOLN
25.0000 ug | INTRAMUSCULAR | Status: DC | PRN
  Administered 2017-07-19 (×2): 25 ug via INTRAVENOUS

## 2017-07-18 MED ORDER — ONDANSETRON HCL 4 MG/2ML IJ SOLN
INTRAMUSCULAR | Status: AC
  Administered 2017-07-18: 4 mg
  Filled 2017-07-18: qty 2

## 2017-07-18 MED ORDER — PIPERACILLIN-TAZOBACTAM 3.375 G IVPB
3.3750 g | Freq: Three times a day (TID) | INTRAVENOUS | Status: DC
  Filled 2017-07-18 (×2): qty 50

## 2017-07-18 MED ORDER — FENTANYL CITRATE (PF) 100 MCG/2ML IJ SOLN
INTRAMUSCULAR | Status: AC
  Administered 2017-07-18: 100 ug
  Filled 2017-07-18: qty 2

## 2017-07-18 NOTE — ED Notes (Signed)
Dr Tomi Bamberger given a copy of lactic acid 8.13 and troponin .29 results

## 2017-07-18 NOTE — Progress Notes (Signed)
Patient transported from ED to 2H16 without any complications.

## 2017-07-18 NOTE — ED Notes (Signed)
Attempting intubation at this time.

## 2017-07-18 NOTE — Procedures (Signed)
Arterial Catheter Insertion Procedure Note Adam Lopez [id number] [birth year]  Procedure: Insertion of Arterial Catheter  Indications: Blood pressure monitoring and Frequent blood sampling  Procedure Details Consent: Unable to obtain consent because of emergent medical necessity. Time Out: Verified patient identification, verified procedure, site/side was marked, verified correct patient position, special equipment/implants available, medications/allergies/relevent history reviewed, required imaging and test results available.  Performed  Maximum sterile technique was used including antiseptics, cap, gloves, gown, hand hygiene, mask and sheet. Skin prep: Chlorhexidine; local anesthetic administered 22 gauge catheter was inserted into right femoral artery using the Seldinger technique.  Evaluation Blood flow good; BP tracing good. Complications: No apparent complications.   Adam Lopez, AGACNP-BC [facility]  Pgr: [phone]  PCCM Pgr: [phone]

## 2017-07-18 NOTE — ED Notes (Signed)
Adjusted pads.  Restarted pacing at 4ma. Pacing at 70 at this time.

## 2017-07-18 NOTE — Consult Note (Addendum)
CARDIOLOGY CONSULT NOTE   Referring Physician: Dr. Tomi Bamberger  Primary Physician: Primary Cardiologist: Dr. Claiborne Billings Reason for Consultation: bradycardia, hypotension, syncope   HPI: Mr. Szczepanik is a 66 yo man with PMH CAD s/p RCA stent 2013, AAA s/p open repair 2016 with aortobifemoral bypass grafts, HTN, dyslipidemia seen in consult at the request of Dr. Tomi Bamberger for bradycardia, hypotension, and syncope.  Patient was intubated on my arrival. History taken from medical team/chart. Per report, patient has had intermittent chest pain for several weeks. This afternoon he had an episode of chest pain, unclear if he took nitroglycerin. He was found unresponsive by family members. EMS was called, on arrival had a pulse but was bradycardic. Received epinephrine and atropine. He was initially conscious on arrival to ER and was complaining of mild chest pain. However, due to his respiratory status he required intubation in the ER. On my arrival, he was on max dose of norepinephrine with systolic in the high 35/HGD 70 range. He was pacing at 70 bpm, and his underlying rhythm had a rate of 30 bpm. Presenting ECG with baseline artifact, unclear presence of P waves, irregular rhythm averaging in the 60s with lateral st downsloping depressions. Repeat ECG with junctional rhythm in the 30s and lateral st depressions. Prior ECG 04/2017 with NSR with nonspecific ST-t wave changes.  Labs notable for lactate of 4, blood alcohol content 155 mg/dl, troponin I 0.25, FOBT negative, ABG 7.35/25/282. Cr 1.28, Alb 2.8, AST 86, ALT 38. WBC 7.6, Hgb/Hct 07/17/35.5 (above his prior), MCV 96.8. CXR without effusion or edema, mediastinum appears slightly rotated from prior but similar to older CXR.  Bedside echo attempted by me; only available window is subcostal. No effusion seen, chambers do not appear dilated, septal/lateral LV walls appear to be contracting  Review of Systems: Unable to be performed as patient is intubated;  pertinent known answers related above.    Cardiac Review of Systems: {Y] = yes [ ] = no  Chest Pain [    ]  Resting SOB [   ] Exertional SOB  [  ]  Orthopnea [  ]   Pedal Edema [   ]    Palpitations [  ] Syncope  [  ]   Presyncope [   ]  General Review of Systems: [Y] = yes [  ]=no Constitional: recent weight change [  ]; anorexia [  ]; fatigue [  ]; nausea [  ]; night sweats [  ]; fever [  ]; or chills [  ];                                                                     Eyes : blurred vision [  ]; diplopia [   ]; vision changes [  ];  Amaurosis fugax[  ]; Resp: cough [  ];  wheezing[  ];  hemoptysis[  ];  PND [  ];  GI:  gallstones[  ], vomiting[  ];  dysphagia[  ]; melena[  ];  hematochezia [  ]; heartburn[  ];   GU: kidney stones [  ]; hematuria[  ];   dysuria [  ];  nocturia[  ]; incontinence [  ];  Skin: rash, swelling[  ];, hair loss[  ];  peripheral edema[  ];  or itching[  ]; Musculosketetal: myalgias[  ];  joint swelling[  ];  joint erythema[  ];  joint pain[  ];  back pain[  ];  Heme/Lymph: bruising[  ];  bleeding[  ];  anemia[  ];  Neuro: TIA[  ];  headaches[  ];  stroke[  ];  vertigo[  ];  seizures[  ];   paresthesias[  ];  difficulty walking[  ];  Psych:depression[  ]; anxiety[  ];  Endocrine: diabetes[  ];  thyroid dysfunction[  ];  Other:  Past Medical History:  Diagnosis Date  . Abdominal aortic aneurysm ([place])   . Congestive heart failure ([place])   . Coronary artery disease   . History of hiatal hernia   . HTN (hypertension) 05/28/2012  . Hypertension   . NSTEMI (non-ST elevated myocardial infarction). 05/28/12 05/28/2012  . Peripheral arterial disease ([place])   . Shortness of breath dyspnea   . Thrombocytopenia ([place]) 05/29/2012  . Tobacco abuse 05/28/2012    Medications Prior to Admission  Medication Sig Dispense Refill  . aspirin 81 MG tablet Take 81 mg by mouth daily.    Marland Kitchen atorvastatin (LIPITOR) 40 MG tablet TAKE 1 TABLET EVERY DAY  AT  6PM 90 tablet 1    . carvedilol (COREG) 12.5 MG tablet TAKE 1 AND 1/2 TABLETS (18.75 MG TOTAL) BY MOUTH TWICE DAILY WITH MEALS. 270 tablet 1  . ipratropium (ATROVENT HFA) 17 MCG/ACT inhaler Inhale 1-2 puffs into the lungs every 6 (six) hours as needed for wheezing. 1 Inhaler 2  . levalbuterol (XOPENEX) 0.63 MG/3ML nebulizer solution Take 3 mLs (0.63 mg total) by nebulization every 6 (six) hours as needed for wheezing or shortness of breath. 3 mL 12  . levofloxacin (LEVAQUIN) 500 MG tablet Take 1 tablet (500 mg total) by mouth daily. 7 tablet 0  . lisinopril (PRINIVIL,ZESTRIL) 20 MG tablet TAKE 1 TABLET (20 MG TOTAL) BY MOUTH DAILY. 90 tablet 1  . nitroGLYCERIN (NITROSTAT) 0.4 MG SL tablet Place 1 tablet (0.4 mg total) under the tongue every 5 (five) minutes as needed for chest pain (up to 3 tabs in 15 mins). 25 tablet 6  . psyllium (METAMUCIL) 58.6 % powder Take 1 packet by mouth daily.    . traMADol (ULTRAM) 50 MG tablet Take 1 tablet (50 mg total) by mouth every 6 (six) hours as needed. 30 tablet 0  . zolpidem (AMBIEN) 5 MG tablet Take 1 tablet (5 mg total) by mouth at bedtime as needed for sleep. 20 tablet 0     . fentaNYL      . [START ON 56/38/9373] folic acid  1 mg Intravenous Daily  . hydrocortisone sod succinate (SOLU-CORTEF) inj  100 mg Intravenous Once  . ipratropium-albuterol  3 mL Nebulization Q6H  . ondansetron (ZOFRAN) IV  4 mg Intravenous Once  . pantoprazole (PROTONIX) IV  40 mg Intravenous QHS  . sodium bicarbonate      . [START ON 07/19/2017] thiamine injection  100 mg Intravenous Daily    Infusions: . sodium chloride 125 mL/hr at 07/09/2017 1853  . sodium chloride    . DOPamine 20 mcg/kg/min (07/17/2017 2106)  . epinephrine 20 mcg/min (07/30/2017 2047)  . norepinephrine (LEVOPHED) Adult infusion    . [START ON 07/19/2017] piperacillin-tazobactam (ZOSYN)  IV    .  sodium bicarbonate  infusion 1000 mL 125 mL/hr at 07/10/2017 2142  . [START ON 07/19/2017] vancomycin    .  vasopressin (PITRESSIN)  infusion - *FOR SHOCK*      Allergies  Allergen Reactions  . Chlorhexidine Itching and Rash  . Codeine Itching and Rash    Social History   Socioeconomic History  . Marital status: Married    Spouse name: Not on file  . Number of children: 2  . Years of education: Not on file  . Highest education level: Not on file  Social Needs  . Financial resource strain: Not on file  . Food insecurity - worry: Not on file  . Food insecurity - inability: Not on file  . Transportation needs - medical: Not on file  . Transportation needs - non-medical: Not on file  Occupational History  . Occupation: Diesel Mechanic  Tobacco Use  . Smoking status: Former Smoker    Packs/day: 1.00    Years: 45.00    Pack years: 45.00    Types: Cigarettes    Last attempt to quit: 07/08/2012    Years since quitting: 5.0  . Smokeless tobacco: Never Used  Substance and Sexual Activity  . Alcohol use: Yes    Alcohol/week: 8.4 oz    Types: 14 Cans of beer per week    Comment: 6 pack weekly of beer  . Drug use: No  . Sexual activity: Not on file  Other Topics Concern  . Not on file  Social History Narrative  . Not on file    Family History  Problem Relation Age of Onset  . Coronary artery disease Mother   . Heart disease Mother   . Heart attack Mother   . Peripheral vascular disease Mother   . AAA (abdominal aortic aneurysm) Mother   . Coronary artery disease Sister     PHYSICAL EXAM: Vitals:   07/14/2017 2130 07/26/2017 2308  BP: (!) 80/67   Pulse: 90 (!) 103  Resp: 16 19  Temp:    SpO2: (!) 70% 97%     Intake/Output Summary (Last 24 hours) at 07/09/2017 2332 Last data filed at 07/28/2017 2234 Gross per 24 hour  Intake 3050 ml  Output -  Net 3050 ml    General:  Intubated, externally pacing HEENT: intubated Neck: no JVD. No carotid bruit appreciated over vent sounds Cor: PMI nondisplaced. Externally paced, rhythm 70 bpm. No murmurs Lungs: distant breath sounds, ventilated, no rales  or wheezing Abdomen: Healed midline surgical incision. Tenderness with use of ultrasound probe but soft. Faint bowel sounds Extremities: no cyanosis, clubbing, rash, edema. Initially mottled but improved w/epinephrine. Faint DP and PT pulses but palpable bilateral femoral pulses under well healed surgical scars. 1+ bilateral radial pulses Neuro: intubated, received fentanyl but is grimacing and making slight movements independently.  ECG: currently externally paced at 70 bpm, prior ECG junctional rhythm with lateral ST depressions  Results for orders placed or performed during the hospital encounter of 07/24/2017 (from the past 24 hour(s))  CBC     Status: Abnormal   Collection Time: 07/31/2017  6:48 PM  Result Value Ref Range   WBC 7.6 4.0 - 10.5 K/uL   RBC 3.77 (L) 4.22 - 5.81 MIL/uL   Hemoglobin 11.9 (L) 13.0 - 17.0 g/dL   HCT 36.5 (L) 39.0 - 52.0 %   MCV 96.8 78.0 - 100.0 fL   MCH 31.6 26.0 - 34.0 pg   MCHC 32.6 30.0 - 36.0 g/dL   RDW 13.1 11.5 - 15.5 %   Platelets 246 150 - 400 K/uL  Lipase, blood     Status: None     Collection Time: 07/26/2017  6:48 PM  Result Value Ref Range   Lipase 42 11 - 51 U/L  Ethanol     Status: Abnormal   Collection Time: 07/31/2017  6:48 PM  Result Value Ref Range   Alcohol, Ethyl (B) 155 (H) <10 mg/dL  Comprehensive metabolic panel     Status: Abnormal   Collection Time: 07/19/2017  6:48 PM  Result Value Ref Range   Sodium 127 (L) 135 - 145 mmol/L   Potassium 4.7 3.5 - 5.1 mmol/L   Chloride 98 (L) 101 - 111 mmol/L   CO2 19 (L) 22 - 32 mmol/L   Glucose, Bld 100 (H) 65 - 99 mg/dL   BUN 13 6 - 20 mg/dL   Creatinine, Ser 1.39 (H) 0.61 - 1.24 mg/dL   Calcium 8.1 (L) 8.9 - 10.3 mg/dL   Total Protein 6.3 (L) 6.5 - 8.1 g/dL   Albumin 2.8 (L) 3.5 - 5.0 g/dL   AST 86 (H) 15 - 41 U/L   ALT 38 17 - 63 U/L   Alkaline Phosphatase 123 38 - 126 U/L   Total Bilirubin 0.7 0.3 - 1.2 mg/dL   GFR calc non Af Amer 51 (L) >60 mL/min   GFR calc Af Amer 59 (L) >60 mL/min     Anion gap 10 5 - 15  I-Stat Chem 8, ED     Status: Abnormal   Collection Time: 08/03/2017  6:54 PM  Result Value Ref Range   Sodium 129 (L) 135 - 145 mmol/L   Potassium 5.0 3.5 - 5.1 mmol/L   Chloride 97 (L) 101 - 111 mmol/L   BUN 17 6 - 20 mg/dL   Creatinine, Ser 1.40 (H) 0.61 - 1.24 mg/dL   Glucose, Bld 94 65 - 99 mg/dL   Calcium, Ion 1.13 (L) 1.15 - 1.40 mmol/L   TCO2 23 22 - 32 mmol/L   Hemoglobin 14.3 13.0 - 17.0 g/dL   HCT 42.0 39.0 - 52.0 %  I-Stat CG4 Lactic Acid, ED     Status: Abnormal   Collection Time: 07/26/2017  6:55 PM  Result Value Ref Range   Lactic Acid, Venous 4.07 (HH) 0.5 - 1.9 mmol/L   Comment NOTIFIED PHYSICIAN   Protime-INR     Status: None   Collection Time: 07/22/2017  6:55 PM  Result Value Ref Range   Prothrombin Time 14.6 11.4 - 15.2 seconds   INR 1.15   I-Stat arterial blood gas, ED     Status: Abnormal   Collection Time: 08/06/2017  6:56 PM  Result Value Ref Range   pH, Arterial 7.353 7.350 - 7.450   pCO2 arterial 25.5 (L) 32.0 - 48.0 mmHg   pO2, Arterial 282.0 (H) 83.0 - 108.0 mmHg   Bicarbonate 14.2 (L) 20.0 - 28.0 mmol/L   TCO2 15 (L) 22 - 32 mmol/L   O2 Saturation 100.0 %   Acid-base deficit 10.0 (H) 0.0 - 2.0 mmol/L   Patient temperature 97.8 F    Collection site RADIAL, ALLEN'S TEST ACCEPTABLE    Drawn by RT    Sample type ARTERIAL   POC occult blood, ED     Status: None   Collection Time: 07/28/2017  6:57 PM  Result Value Ref Range   Fecal Occult Bld NEGATIVE NEGATIVE  Type and screen [facility]     Status: None   Collection Time: 07/10/2017  7:00 PM  Result Value Ref Range   ABO/RH(D) A NEG    Antibody Screen   NEG    Sample Expiration 07/21/2017   I-Stat Troponin, ED (not at [facility])     Status: Abnormal   Collection Time: 07/26/2017  7:06 PM  Result Value Ref Range   Troponin i, poc 0.25 (HH) 0.00 - 0.08 ng/mL   Comment NOTIFIED PHYSICIAN    Comment 3          Urinalysis, Routine w reflex microscopic     Status: Abnormal    Collection Time: 07/31/2017  8:04 PM  Result Value Ref Range   Color, Urine YELLOW YELLOW   APPearance CLEAR CLEAR   Specific Gravity, Urine 1.005 1.005 - 1.030   pH 6.0 5.0 - 8.0   Glucose, UA NEGATIVE NEGATIVE mg/dL   Hgb urine dipstick SMALL (A) NEGATIVE   Bilirubin Urine NEGATIVE NEGATIVE   Ketones, ur NEGATIVE NEGATIVE mg/dL   Protein, ur 30 (A) NEGATIVE mg/dL   Nitrite NEGATIVE NEGATIVE   Leukocytes, UA NEGATIVE NEGATIVE   RBC / HPF 0-5 0 - 5 RBC/hpf   WBC, UA 0-5 0 - 5 WBC/hpf   Bacteria, UA RARE (A) NONE SEEN   Squamous Epithelial / LPF 0-5 (A) NONE SEEN   Mucus PRESENT   I-Stat arterial blood gas, ED     Status: Abnormal   Collection Time: 07/14/2017  9:02 PM  Result Value Ref Range   pH, Arterial 7.138 (LL) 7.350 - 7.450   pCO2 arterial 28.4 (L) 32.0 - 48.0 mmHg   pO2, Arterial 454.0 (H) 83.0 - 108.0 mmHg   Bicarbonate 9.6 (L) 20.0 - 28.0 mmol/L   TCO2 10 (L) 22 - 32 mmol/L   O2 Saturation 100.0 %   Acid-base deficit 18.0 (H) 0.0 - 2.0 mmol/L   Patient temperature HIDE    Collection site RADIAL, ALLEN'S TEST ACCEPTABLE    Sample type ARTERIAL    Comment NOTIFIED PHYSICIAN   I-stat troponin, ED (0, 3)     Status: Abnormal   Collection Time: 08/04/2017  9:21 PM  Result Value Ref Range   Troponin i, poc 0.29 (HH) 0.00 - 0.08 ng/mL   Comment NOTIFIED PHYSICIAN    Comment 3          I-Stat CG4 Lactic Acid, ED     Status: Abnormal   Collection Time: 07/17/2017  9:23 PM  Result Value Ref Range   Lactic Acid, Venous 8.13 (HH) 0.5 - 1.9 mmol/L   Comment NOTIFIED PHYSICIAN   Fibrinogen (coagulopathy lab panel)     Status: None   Collection Time: 07/17/2017 10:29 PM  Result Value Ref Range   Fibrinogen 218 210 - 475 mg/dL  APTT (coagulopathy lab panel)     Status: Abnormal   Collection Time: 07/31/2017 10:29 PM  Result Value Ref Range   aPTT 52 (H) 24 - 36 seconds  Basic metabolic panel     Status: Abnormal   Collection Time: 07/11/2017 10:29 PM  Result Value Ref Range    Sodium 128 (L) 135 - 145 mmol/L   Potassium 5.3 (H) 3.5 - 5.1 mmol/L   Chloride 102 101 - 111 mmol/L   CO2 13 (L) 22 - 32 mmol/L   Glucose, Bld 195 (H) 65 - 99 mg/dL   BUN 13 6 - 20 mg/dL   Creatinine, Ser 1.46 (H) 0.61 - 1.24 mg/dL   Calcium 6.0 (LL) 8.9 - 10.3 mg/dL   GFR calc non Af Amer 48 (L) >60 mL/min   GFR calc Af Amer 56 (L) >60 mL/min   Anion gap 13  5 - 15  Magnesium     Status: Abnormal   Collection Time: 07/22/2017 10:29 PM  Result Value Ref Range   Magnesium 1.3 (L) 1.7 - 2.4 mg/dL  Phosphorus     Status: Abnormal   Collection Time: 07/09/2017 10:29 PM  Result Value Ref Range   Phosphorus 5.7 (H) 2.5 - 4.6 mg/dL  .Cooxemetry Panel (carboxy, met, total hgb, O2 sat)     Status: Abnormal   Collection Time: 07/08/2017 10:45 PM  Result Value Ref Range   Total hemoglobin 10.6 (L) 12.0 - 16.0 g/dL   O2 Saturation 98.8 %   Carboxyhemoglobin 0.7 0.5 - 1.5 %   Methemoglobin 0.7 0.0 - 1.5 %   Dg Abdomen 1 View  Result Date: 07/12/2017 CLINICAL DATA:  Orogastric tube. EXAM: ABDOMEN - 1 VIEW COMPARISON:  05/30/2015 FINDINGS: Exam demonstrates patient's enteric tube which has been pulled back and has tip over the stomach in the left upper quadrant and side port at the level of the gastroesophageal junction. Bowel gas pattern is nonobstructive. Remainder of the exam is unchanged. IMPRESSION: Nonobstructive bowel gas pattern. Enteric tube with tip over the stomach and side-port at the region of the gastroesophageal junction. This could be advanced approximately 8 cm. Electronically Signed   By: Daniel  Boyle M.D.   On: 07/27/2017 20:32   Ct Head Wo Contrast  Result Date: 07/26/2017 CLINICAL DATA:  Found face down for unknown period of time. History of hypertension. EXAM: CT HEAD WITHOUT CONTRAST TECHNIQUE: Contiguous axial images were obtained from the base of the skull through the vertex without intravenous contrast. COMPARISON:  None. FINDINGS: BRAIN: No intraparenchymal hemorrhage, mass  effect nor midline shift. Moderate to severe parenchymal brain volume loss. No hydrocephalus. Patchy supratentorial white matter hypodensities within normal range for patient's age, though non-specific are most compatible with chronic small vessel ischemic disease. No acute large vascular territory infarcts. No abnormal extra-axial fluid collections. Basal cisterns are patent. VASCULAR: Severe calcific atherosclerosis of the carotid siphons. SKULL: No skull fracture. No significant scalp soft tissue swelling. SINUSES/ORBITS: The mastoid air-cells and included paranasal sinuses are well-aerated.The included ocular globes and orbital contents are non-suspicious. Symmetrically enlarged superior ophthalmic veins associated with positive end pressure ventilation. OTHER: Life-support lines in place. IMPRESSION: 1. No acute intracranial process. 2. Moderate to severe parenchymal brain volume loss, advanced for age. 3. Mild chronic small vessel ischemic disease. 4. Severe atherosclerosis. Electronically Signed   By: Courtnay  Bloomer M.D.   On: 07/08/2017 23:22   Dg Chest Portable 1 View  Result Date: 07/30/2017 CLINICAL DATA:  66 y/o  M; endotracheal tube placement. EXAM: PORTABLE CHEST 1 VIEW COMPARISON:  06/05/2015 chest radiograph FINDINGS: Endotracheal tube 5.7 cm from carina. Enteric tube tip below the field of view in the abdomen. Transcutaneous pacing pads noted. Stable cardiac silhouette. Aortic atherosclerosis. Clear lungs. No acute osseous abnormality is evident. IMPRESSION: Endotracheal tube 5.7 cm from carina. Enteric tube tip below field of view and abdomen. Stable cardiac silhouette. Clear lungs. Electronically Signed   By: Lance  Furusawa-Stratton M.D.   On: 07/29/2017 20:19    Lexiscan 03/2015:  Nuclear stress EF: 55%. The left ventricular ejection fraction is normal (55-65%).  Downsloping ST segment depression ST segment depression was noted during stress in the II, III, aVF, V5 and V6 leads,  beginning at 1 minutes of stress, ending at 2 minutes of stress, and returning to baseline after 1-5 minutes of recovery.  The study is normal.  This is a low   risk study.  Echo 2013: Left ventricle: The cavity size was normal. Wall thickness was increased in a pattern of mild LVH. There was mild concentric hypertrophy. Systolic function was normal. The estimated ejection fraction was in the range of 50% to 55%. Left ventricular diastolic function parameters were normal. - Aortic valve: Trileaflet; mildly thickened leaflets. There was no stenosis. No regurgitation. - Mitral valve: Mildly calcified annulus. Mildly thickened leaflets . Systolic bowing without prolapse. Trivial regurgitation. - Atrial septum: No defect or patent foramen ovale was identified. - Tricuspid valve: Mild regurgitation.  ASSESSMENT/PLAN: Mr. Kabat is a 66 yo man with PMH CAD s/p RCA stent 2013, AAA s/p open repair 2016 with aortobifemoral bypass grafts, HTN, dyslipidemia seen in consult at the request of Dr. Tomi Bamberger for bradycardia, hypotension, and syncope.  Has reportedly had intermittent chest pain for days. Has prior RCA stent. Initial troponin positive, ECG junctional with lateral ST depressions, but no STEMI. Pacing not improving hypotension, requiring high dose epinephrine and norepinephrine to maintain systolic BP in the 58N. ABG initially with good oxygenation, O2 saturations have been difficult to capture by probe.  There is concern for this as ACS, but persistent hypotension and shortness of breath also concerning for PE. No evidence of infection on initial workup. Given his prior AAA repair and history of vascular disease, dissection also on differential. Holding on heparin until cleared for dissection per ER team.  Plan is that once patient can be stabilized, CT C/A/P to evaluate for PE and dissection. Once back, can get STAT echo and look for cardiogenic shock. Initial eval by ER  team did not show effusion per report, no enlarged cardiac silhouette, will confirm with echo.  Discussed dopamine, isoproterenol to attempt to increase HR, though pacing alone has not improved his status. Once initial workup done, will determine if temp wire needed for prolonged pacing.  Patient is critically ill at this time, no reversible cause as of yet determined, will continue to evaluate for cause.  ADDENDUM 11:26 PM Patient required maximum doses of norepinephrine, epinephrine, and dopamine, as well as bicarb pushes, to travel to CT. Formal read pending on CT. On my initial review, no clear PE or dissection. ?Abnormality in right lung (seen in cross section in image 36/103 of the lung contrast scan. Seen in coronal and sagittal images as well. Another ?abnormality seen in image 68/103 of the same scan. Scans do show significant coronary calcification, as well as aortic and other arterial calcification. Unclear if gallbladder enlarged or if there is fat stranding, will wait for final read.  On arrival to [place], patient's BP improving, attempting to wean pressors slowly. Pacer turned down, underlying rhythm seen at 60-70 BPM, maintaining BP with this rhythm. Will hold on temp wire for now, reconsider if he requires additional external pacing. Awaiting final read on imaging, but unclear as to what precipitated his illness at this time. We will continue to follow.  Paco Cislo, overnight cardiology provider  ADDENDUM 12:15 AM Final read of CT scan:  CTA chest: 1. 2.9 cm right upper lobe cavitary spiculated nodule and 3.7 cm right lower lobe mass. Findings probably represent synchronous or primary/metastatic lung cancer. Distant metastatic disease is less likely. The right upper lobe lesion invades the hilum. There is a satellite nodule inferior to the right lower lobe lesion. Sub- carinal lymphadenopathy. 2. No aortic dissection. 3. Left common carotid artery origin fibrofatty plaque  with moderate 50% stenosis. 4. Cardiomegaly and severe coronary artery calcification.   CT  abdomen and pelvis: 1. Diffuse gallbladder wall thickening and extensive inflammation surrounding the pancreas. Inflammation is likely due to acute pancreatitis with either reactive changes of the gallbladder or concurrent acute cholecystitis. 2. No aortic dissection. 3. Infrarenal abdominal aortic repair with patent bilateral aortofemoral bypass. Extensive atherosclerotic disease with severe proximal femoral artery stenosis, severe mid SMA stenosis, severe bilateral renal artery stenosis. 4. Layering density within the gallbladder may represent small stones or vicarious excretion of contrast. 5. Small volume of ascites.  Will contact PCCM to make sure they are aware as well. 

## 2017-07-18 NOTE — ED Notes (Signed)
Pacer pads replaced.  Now at 140 ma with hr of 70.

## 2017-07-18 NOTE — ED Notes (Signed)
CVP line placed in right femoral by Beaulah Corin at this time.

## 2017-07-18 NOTE — ED Triage Notes (Signed)
Pt arrived via [facility] from home found face down for unknown amount of time c/o central chest pain with EMS.  Pts family states he took unknown amount of Nitro today. Pt arrives on non-rebreather, diaphoretic.   EMS gave 9 mcg EPI, 1mg  Atropine, 650Ml NS.

## 2017-07-18 NOTE — ED Provider Notes (Signed)
Procedure Name: Intubation Date/Time: 07/30/2017 10:51 PM Performed by: Fenton Foy, MD Pre-anesthesia Checklist: Emergency Drugs available, Suction available, Patient being monitored and Patient identified Oxygen Delivery Method: Ambu bag Induction Type: IV induction and Rapid sequence Laryngoscope Size: Glidescope and 3 Grade View: Grade I Tube size: 7.5 mm Number of attempts: 1 Airway Equipment and Method: Rigid stylet Placement Confirmation: ETT inserted through vocal cords under direct vision,  Positive ETCO2,  CO2 detector and Breath sounds checked- equal and bilateral Secured at: 23 cm Tube secured with: ETT holder Dental Injury: Teeth and Oropharynx as per pre-operative assessment  Future Recommendations: Recommend- induction with short-acting agent, and alternative techniques readily available    .Central Line Date/Time: 07/12/2017 10:52 PM Performed by: Fenton Foy, MD Authorized by: Fenton Foy, MD   Consent:    Consent obtained:  Emergent situation Pre-procedure details:    Hand hygiene: Hand hygiene performed prior to insertion     Sterile barrier technique: All elements of maximal sterile technique followed     Skin preparation:  ChloraPrep   Skin preparation agent: Skin preparation agent completely dried prior to procedure   Procedure details:    Location:  R internal jugular   Patient position:  Flat   Procedural supplies:  Triple lumen   Ultrasound guidance: yes     Number of attempts:  1   Successful placement: yes   Post-procedure details:    Post-procedure:  Dressing applied and line sutured   Assessment:  Blood return through all ports, free fluid flow, no pneumothorax on x-ray and placement verified by x-ray   Patient tolerance of procedure:  Tolerated well, no immediate complications      Fenton Foy, MD 07/25/2017 1856    Dorie Rank, MD 07/09/2017 [phone]

## 2017-07-18 NOTE — Progress Notes (Signed)
Patient transported from ED to CT and back without any complications.

## 2017-07-18 NOTE — ED Notes (Signed)
Levo and epi changed to central line in right subclavian.  Tubing changed prior to change.

## 2017-07-18 NOTE — ED Notes (Signed)
Pacing at 124 ma for rate of 70.

## 2017-07-18 NOTE — ED Notes (Signed)
Increased pacer to 147ma.  Rate 70 at this time.

## 2017-07-18 NOTE — Progress Notes (Signed)
eLink Physician-Brief Progress Note Patient Name: Adam Lema Sr. DOB: Aug 21, 1951 MRN: [id number]   Date of Service  07/09/2017  HPI/Events of Note  Patient oozing from central line site. Bedside nurse requests order for Thrombipad.  eICU Interventions  Will order: 1. Thrombipad for central line site.      Intervention Category Major Interventions: Other:  Lysle Dingwall 07/26/2017, 11:46 PM

## 2017-07-18 NOTE — Progress Notes (Addendum)
Pharmacy Antibiotic Note  Adam Lopez. is a 66 y.o. male admitted on 07/31/2017 with sepsis.  Pharmacy has been consulted for zosyn and vancomycin dosing. -WBC= 7.6, afebrile, SCr= 1.4 and CrCl ~ 45 - zosyn 3.375gm has been ordered in the ED  Plan: -Zosyn 3.375gm IV q8h -Vancomycin 1250mg  IV x1 followed by 500mg  IV q12h -Will follow renal function, cultures and clinical progress    Height: 5\' 10"  (177.8 cm) Weight: 140 lb (63.5 kg) IBW/kg (Calculated) : 73  Temp (24hrs), Avg:97.3 F (36.3 C), Min:96.8 F (36 C), Max:97.8 F (36.6 C)  Recent Labs  Lab 07/10/2017 1848 07/25/2017 1854 07/09/2017 1855  WBC 7.6  --   --   CREATININE 1.39* 1.40*  --   LATICACIDVEN  --   --  4.07*    Estimated Creatinine Clearance: 46.6 mL/min (A) (by C-G formula based on SCr of 1.4 mg/dL (H)).    Allergies  Allergen Reactions  . Chlorhexidine Itching and Rash  . Codeine Itching and Rash    Antimicrobials this admission: 11/11 vanc 11/11 zosyn   Dose adjustments this admission:  Microbiology results:  Thank you for allowing pharmacy to be a part of this patient's care.  Hildred Laser, Pharm D 08/03/2017 8:28 PM

## 2017-07-18 NOTE — ED Notes (Signed)
Per physician more regular pulse noted on palpation.

## 2017-07-18 NOTE — Progress Notes (Deleted)
Patient transported from ED to 2M02 without any complications.

## 2017-07-18 NOTE — Progress Notes (Signed)
RT placed pt on a [place] 4 L based on ABG results.  Pt's pulse oximeter is not picking up.  RT tried pulse ox on finger, ear, and forehead without success.

## 2017-07-18 NOTE — Progress Notes (Addendum)
[facility] Chaplain called to be with family of patient. Dr. Council Mechanic and gave family a report of what they are doing.  Had prayer with the family and they went back to ED waiting room due to having children with them. Heart doctor has been called and some heart things may need to take place.Conard Novak, Chaplain   07/10/2017 1900  Clinical Encounter Type  Visited With Family  Visit Type Initial;Social support  Referral From Nurse  Consult/Referral To Chaplain  Spiritual Encounters  Spiritual Needs Prayer  Stress Factors  Patient Stress Factors Health changes  Family Stress Factors Health changes

## 2017-07-18 NOTE — ED Notes (Signed)
Externally pacing at this time.  16 french OG placed at this time.  Xray at the bedside.

## 2017-07-18 NOTE — ED Notes (Signed)
Intubated at this time.  23 at the lip with positive color change.

## 2017-07-18 NOTE — H&P (Signed)
PULMONARY / CRITICAL CARE MEDICINE   Name: Adam Lamere Sr. MRN: [id number] DOB: [birth year]    ADMISSION DATE:  07/15/2017 CONSULTATION DATE:  08/03/2017  REFERRING MD:  Dr. Tomi Bamberger  CHIEF COMPLAINT:  Chest Pain   HISTORY OF PRESENT ILLNESS:   66 year old male with PMH of CAD s/p RCA stent 2013, AAA s/p open repair in 2016 with aortiobifemoral bypass graft, HTN, HLD, ETOH  Presents to ED on 11/11 after wife found patient face down for unknown time. When EMS arrived patient complaining of central chest pain/syncope. Family states he took an unknown amount of nitro tablets. EMS gave 9 mcg EPI and 1 mg Atropine for Bradycardia and Hypotension. Upon arrival to ED patient is diaphoretic and non-rebreather. Intubated in ED. ABG 7.353/25.5/282. ETOH 155. BP Systolic 08-14, on external pacer, underlying HR 15. Cardiology consulted. PCCM asked to admit.   Upon arrival to ED patient is on 20 Levophed with Systolic 40 and currently on external pacer. When looking at [facility] bottle, patient only missing 4 tablets (family states they are sure he took one on Halloween)  Family reports for the last few months patient has been complaining of progressive dyspnea, over the last couple of weeks intermittent chest pain. Per family patient drinks heavily.    PAST MEDICAL HISTORY :  He  has a past medical history of Abdominal aortic aneurysm ([place]), Congestive heart failure ([place]), Coronary artery disease, History of hiatal hernia, HTN (hypertension) (05/28/2012), Hypertension, NSTEMI (non-ST elevated myocardial infarction). 05/28/12 (05/28/2012), Peripheral arterial disease ([place]), Shortness of breath dyspnea, Thrombocytopenia ([place]) (05/29/2012), and Tobacco abuse (05/28/2012).  PAST SURGICAL HISTORY: He  has a past surgical history that includes Hernia repair; Coronary stent placement; Cardiac catheterization; AORTOBIFEMORAL BYPASS GRAFT (Bilateral, 05/30/2015); and LEFT HEART CATHETERIZATION WITH CORONARY  ANGIOGRAM (N/A, 05/30/2012).  Allergies  Allergen Reactions  . Chlorhexidine Itching and Rash  . Codeine Itching and Rash    No current facility-administered medications on file prior to encounter.    Current Outpatient Medications on File Prior to Encounter  Medication Sig  . aspirin 81 MG tablet Take 81 mg by mouth daily.  Marland Kitchen atorvastatin (LIPITOR) 40 MG tablet TAKE 1 TABLET EVERY DAY  AT  6PM  . carvedilol (COREG) 12.5 MG tablet TAKE 1 AND 1/2 TABLETS (18.75 MG TOTAL) BY MOUTH TWICE DAILY WITH MEALS.  Marland Kitchen ipratropium (ATROVENT HFA) 17 MCG/ACT inhaler Inhale 1-2 puffs into the lungs every 6 (six) hours as needed for wheezing.  . levalbuterol (XOPENEX) 0.63 MG/3ML nebulizer solution Take 3 mLs (0.63 mg total) by nebulization every 6 (six) hours as needed for wheezing or shortness of breath.  . levofloxacin (LEVAQUIN) 500 MG tablet Take 1 tablet (500 mg total) by mouth daily.  Marland Kitchen lisinopril (PRINIVIL,ZESTRIL) 20 MG tablet TAKE 1 TABLET (20 MG TOTAL) BY MOUTH DAILY.  . nitroGLYCERIN (NITROSTAT) 0.4 MG SL tablet Place 1 tablet (0.4 mg total) under the tongue every 5 (five) minutes as needed for chest pain (up to 3 tabs in 15 mins).  . psyllium (METAMUCIL) 58.6 % powder Take 1 packet by mouth daily.  . traMADol (ULTRAM) 50 MG tablet Take 1 tablet (50 mg total) by mouth every 6 (six) hours as needed.  . zolpidem (AMBIEN) 5 MG tablet Take 1 tablet (5 mg total) by mouth at bedtime as needed for sleep.    FAMILY HISTORY:  His indicated that his mother is alive. He indicated that his sister is alive.   SOCIAL HISTORY: He  reports that  he quit smoking about 5 years ago. His smoking use included cigarettes. He has a 45.00 pack-year smoking history. he has never used smokeless tobacco. He reports that he drinks about 8.4 oz of alcohol per week. He reports that he does not use drugs.  REVIEW OF SYSTEMS:   Unable to review as patient is intubated   SUBJECTIVE:   VITAL SIGNS: BP (!) 80/67   Pulse  90   Temp 97.8 F (36.6 C) (Rectal)   Resp 16   Ht 5\' 10"  (1.778 m)   Wt 63.5 kg (140 lb)   SpO2 (!) 70%   BMI 20.09 kg/m   HEMODYNAMICS:    VENTILATOR SETTINGS: Vent Mode: PRVC FiO2 (%):  [40 %-100 %] 40 % Set Rate:  [14 bmp] 14 bmp Vt Set:  [580 mL] 580 mL PEEP:  [5 cmH20] 5 cmH20 Plateau Pressure:  [14 cmH20] 14 cmH20  INTAKE / OUTPUT: No intake/output data recorded.  PHYSICAL EXAMINATION: General:  Adult male, pale, diaphoretic  Neuro:  Sedated, pupils non-reactive and 4 mm, +gag/cough  HEENT:  ETT in place  Cardiovascular:  Paced, no MRG  Lungs:  Clear breath sounds, no wheeze/crackles  Abdomen:  Midline scar noted, non-distended, active bowel sounds  Musculoskeletal:  -edema  Skin:  mottled throughout upper and lower extremities, clubbing noted   LABS:  BMET Recent Labs  Lab 07/19/2017 1848 07/30/2017 1854  NA 127* 129*  K 4.7 5.0  CL 98* 97*  CO2 19*  --   BUN 13 17  CREATININE 1.39* 1.40*  GLUCOSE 100* 94    Electrolytes Recent Labs  Lab 08/03/2017 1848  CALCIUM 8.1*    CBC Recent Labs  Lab 07/22/2017 1848 07/08/2017 1854  WBC 7.6  --   HGB 11.9* 14.3  HCT 36.5* 42.0  PLT 246  --     Coag's Recent Labs  Lab 07/26/2017 1855  INR 1.15    Sepsis Markers Recent Labs  Lab 07/25/2017 1855 07/17/2017 2123  LATICACIDVEN 4.07* 8.13*    ABG Recent Labs  Lab 07/27/2017 1856 07/20/2017 2102  PHART 7.353 7.138*  PCO2ART 25.5* 28.4*  PO2ART 282.0* 454.0*    Liver Enzymes Recent Labs  Lab 07/20/2017 1848  AST 86*  ALT 38  ALKPHOS 123  BILITOT 0.7  ALBUMIN 2.8*    Cardiac Enzymes No results for input(s): TROPONINI, PROBNP in the last 168 hours.  Glucose No results for input(s): GLUCAP in the last 168 hours.  Imaging Dg Abdomen 1 View  Result Date: 07/12/2017 CLINICAL DATA:  Orogastric tube. EXAM: ABDOMEN - 1 VIEW COMPARISON:  05/30/2015 FINDINGS: Exam demonstrates patient's enteric tube which has been pulled back and has tip over the  stomach in the left upper quadrant and side port at the level of the gastroesophageal junction. Bowel gas pattern is nonobstructive. Remainder of the exam is unchanged. IMPRESSION: Nonobstructive bowel gas pattern. Enteric tube with tip over the stomach and side-port at the region of the gastroesophageal junction. This could be advanced approximately 8 cm. Electronically Signed   By: Marin Olp M.D.   On: 07/25/2017 20:32   Dg Chest Portable 1 View  Result Date: 07/09/2017 CLINICAL DATA:  66 y/o  M; endotracheal tube placement. EXAM: PORTABLE CHEST 1 VIEW COMPARISON:  06/05/2015 chest radiograph FINDINGS: Endotracheal tube 5.7 cm from carina. Enteric tube tip below the field of view in the abdomen. Transcutaneous pacing pads noted. Stable cardiac silhouette. Aortic atherosclerosis. Clear lungs. No acute osseous abnormality is evident. IMPRESSION: Endotracheal  tube 5.7 cm from carina. Enteric tube tip below field of view and abdomen. Stable cardiac silhouette. Clear lungs. Electronically Signed   By: Kristine Garbe M.D.   On: 07/11/2017 20:19     STUDIES:  CXR 11/11 > Endotracheal tube 5.7 cm from carina. Enteric tube tip below the field of view in the abdomen. Transcutaneous pacing pads noted. Stable cardiac silhouette. Aortic atherosclerosis. Clear lungs. No acute osseous abnormality is evident ABD 11/11 > Nonobstructive bowel gas pattern. Enteric tube with tip over the stomach and side-port at the region of the gastroesophageal junction. This could be advanced approximately 8 cm. CTA Chest 11/11 > 2.9 cm right upper lobe cavitary spiculated nodule and 3.7 cm right lower lobe mass. Findings probably represent synchronous or primary/metastatic lung cancer. Distant metastatic disease is less likely. The right upper lobe lesion invades the hilum. There is a satellite nodule inferior to the right lower lobe lesion. Sub- carinal lymphadenopathy. 2. No aortic dissection. 3. Left common  carotid artery origin fibrofatty plaque with moderate 50% stenosis. 4. Cardiomegaly and severe coronary artery calcification.  CT A/P 11/11 > 1. Diffuse gallbladder wall thickening and extensive inflammation surrounding the pancreas. Inflammation is likely due to acute pancreatitis with either reactive changes of the gallbladder or concurrent acute cholecystitis. 2. No aortic dissection. 3. Infrarenal abdominal aortic repair with patent bilateral aortofemoral bypass. Extensive atherosclerotic disease with severe proximal femoral artery stenosis, severe mid SMA stenosis, severe bilateral renal artery stenosis. 4. Layering density within the gallbladder may represent small stones or vicarious excretion of contrast. 5. Small volume of ascites.  CULTURES: U/A 11/11 > Negative  Blood Culture 11/11 >> Sputum 11/11 >>   ANTIBIOTICS: Vancomycin 11/11 Zosyn 11/11   SIGNIFICANT EVENTS: 11/11 > Presents to ED   LINES/TUBES: ETT 11/11 >> Right IJ 11/11 >>  Right Femoral 11/11 >>  DISCUSSION: 66 year old male presents to ED hypoxic, hypotensive, and bradycardiac. Intubated in ED. Cardiology consulted.   ASSESSMENT / PLAN:  PULMONARY A: Acute Hypoxic Respiratory Failure  CT Chest with 2.9 cm right upper lobe cavitary spiculated nodule and 3.7 cm right lower lobe mass highly suspicious for malignancy  H/O Tobacco Use  P:   Vent Support  VAP Bundle  Trend CXR/ABG   CARDIOVASCULAR A:  Septic vs Cardiogenic Shock  CAD s/p RCA stent 2013, AAA repair 2016 H/O HTN, HLD  CT Chest with cardiomegaly and severe coronary artery calcification.  P:  Cardiology Following  Cardiac Monitoring  Maintain MAP >65 (Currently on Levophed and Dopamine), Add Vasopressin  Coxy Panel > 10.6/0.7/0.7/98.8  Trend CVP   RENAL A:   Anion Gap Metabolic Acidosis  LA 9.62 > 8.13  CT A/P with severe renal stenosis  Hyponatremia s/p 3L NA 127  P:   Trend BMP Replace electrolytes as indicated  Bicarb gtt  @ 150 ml/hr  Trend LA  Serum and Urine Osmo pending   GASTROINTESTINAL A:   Concern for Acute Pancreatitis  AST/ALT 86/38 Lipase 42  P:   NPO PPI Amylase pending   HEMATOLOGIC A:   VTE Prop  P:  Trend CBC  Coag panel pending  SCDs   INFECTIOUS A:   Acute Pancreatitis/Cholecystosis   CT A/P with edema surrounding pancreas and gallbladder wall thickening with layering density within may represent stones   U/A negative  PCT <.10  P:   Trend WBC and Fever Curve Trend LA and PCT  Follow Culture Data  Monitor off antibiotics   ENDOCRINE A:  Hyperglycemia    P:   Trend Glucose SSI    NEUROLOGIC A:   Acute Metabolic Encephalopathy  H/O ETOH ETOH 155  CT Head with moderate to severe parenchymal brain volume loss with mild chronic small vessel ischemic disease  P:   RASS goal: 0/-1 Wean Fentanyl gtt to achieve RASS  Folic Acid/Thiamine    FAMILY  - Updates: Family updated on plan of care   - Inter-disciplinary family meet or Palliative Care meeting due by: 07/25/2017   CC Time: 82 minutes   [name], AGACNP-BC [facility]  Pgr: [phone]  PCCM Pgr: [phone]

## 2017-07-18 NOTE — ED Provider Notes (Signed)
[facility] EMERGENCY DEPARTMENT Provider Note   CSN: [id number] Arrival date & time: 07/12/2017  1832     History   Chief Complaint Chief Complaint  Patient presents with  . Loss of Consciousness  . Chest Pain    HPI Adam Severt Sr. is a 66 y.o. male.  HPI Patient presented to the emergency room for evaluation of chest pain and syncope.  Family states for the last couple weeks patient has had some intermittent episodes of chest pain.  While at home today he started complaining of some chest pain again.  He may have taken some nitroglycerin.  Family members found the patient unresponsive.  Patient does drink alcohol regularly.  He was drinking alcohol today.  When EMS arrived they found the patient to be bradycardic.  Was not given 9 mg and epinephrine and 1 mg and atropine prior to arrival.  Patient continues to complain of some mild chest pain.  He denies any vomiting or diarrhea.  No fever. Past Medical History:  Diagnosis Date  . Abdominal aortic aneurysm ([place])   . Congestive heart failure ([place])   . Coronary artery disease   . History of hiatal hernia   . HTN (hypertension) 05/28/2012  . Hypertension   . NSTEMI (non-ST elevated myocardial infarction). 05/28/12 05/28/2012  . Peripheral arterial disease ([place])   . Shortness of breath dyspnea   . Thrombocytopenia ([place]) 05/29/2012  . Tobacco abuse 05/28/2012    Patient Active Problem List   Diagnosis Date Noted  . PVD (peripheral vascular disease) ([place]) 03/31/2016  . AAA (abdominal aortic aneurysm) ([place]) 03/19/2015  . CAD (coronary artery disease) 06/05/2013  . Hyperlipidemia with target LDL less than 70 06/05/2013  . Leukopenia 05/29/2012  . Thrombocytopenia ([place]) 05/29/2012  . NSTEMI (non-ST elevated myocardial infarction). 05/28/12 05/28/2012  . HTN (hypertension) 05/28/2012  . Tobacco abuse 05/28/2012  . 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 CARDIAC CATHETERIZATION    . CORONARY STENT PLACEMENT    . HERNIA REPAIR         Home Medications    Prior to Admission medications   Medication Sig Start Date End Date Taking? Authorizing Provider  aspirin 81 MG tablet Take 81 mg by mouth daily.    [provider]  atorvastatin (LIPITOR) 40 MG tablet TAKE 1 TABLET EVERY DAY  AT  6PM 02/25/17   Troy Sine, MD  carvedilol (COREG) 12.5 MG tablet TAKE 1 AND 1/2 TABLETS (18.75 MG TOTAL) BY MOUTH TWICE DAILY WITH MEALS. 02/25/17   Troy Sine, MD  ipratropium (ATROVENT HFA) 17 MCG/ACT inhaler Inhale 1-2 puffs into the lungs every 6 (six) hours as needed for wheezing. 06/06/15   Rhyne, Hulen Shouts, PA-C  levalbuterol (XOPENEX) 0.63 MG/3ML nebulizer solution Take 3 mLs (0.63 mg total) by nebulization every 6 (six) hours as needed for wheezing or shortness of breath. 06/06/15   Rhyne, Hulen Shouts, PA-C  levofloxacin (LEVAQUIN) 500 MG tablet Take 1 tablet (500 mg total) by mouth daily. 06/06/15   Rhyne, Hulen Shouts, PA-C  lisinopril (PRINIVIL,ZESTRIL) 20 MG tablet TAKE 1 TABLET (20 MG TOTAL) BY MOUTH DAILY. 02/25/17   Troy Sine, MD  nitroGLYCERIN (NITROSTAT) 0.4 MG SL tablet Place 1 tablet (0.4 mg total) under the tongue every 5 (five) minutes as needed for chest pain (up to 3 tabs in 15 mins). 12/19/15 09/12/17  Troy Sine, MD  psyllium (METAMUCIL) 58.6 % powder Take 1 packet by mouth daily.    [provider]  traMADol (ULTRAM) 50 MG tablet Take 1 tablet (50 mg total) by mouth every 6 (six) hours as needed. 06/06/15   Rhyne, Hulen Shouts, PA-C  zolpidem (AMBIEN) 5 MG tablet Take 1 tablet (5 mg total) by mouth at bedtime as needed for sleep. 06/10/15   Serafina Mitchell, MD    Family History Family History  Problem Relation Age of Onset  . Coronary artery disease Mother   . Heart disease Mother   . Heart attack Mother   . Peripheral vascular disease Mother   . AAA (abdominal aortic  aneurysm) Mother   . Coronary artery disease Sister     Social History Social History   Tobacco Use  . Smoking status: Former Smoker    Packs/day: 1.00    Years: 45.00    Pack years: 45.00    Types: Cigarettes    Last attempt to quit: 07/08/2012    Years since quitting: 5.0  . Smokeless tobacco: Never Used  Substance Use Topics  . Alcohol use: Yes    Alcohol/week: 8.4 oz    Types: 14 Cans of beer per week    Comment: 6 pack weekly of beer  . Drug use: No     Allergies   Chlorhexidine and Codeine   Review of Systems Review of Systems   Physical Exam Updated Vital Signs BP (!) 81/45   Pulse 70   Temp 97.8 F (36.6 C) (Rectal)   Resp (!) 29   Ht 1.778 m (5\' 10" )   Wt 63.5 kg (140 lb)   SpO2 (!) 50%   BMI 20.09 kg/m   Physical Exam  Constitutional: He appears ill. He appears distressed.  HENT:  Head: Normocephalic and atraumatic.  Right Ear: External ear normal.  Left Ear: External ear normal.  Eyes: Conjunctivae are normal. Right eye exhibits no discharge. Left eye exhibits no discharge. No scleral icterus.  Neck: Neck supple. No tracheal deviation present.  Cardiovascular: Regular rhythm and intact distal pulses. Bradycardia present. Exam reveals no gallop.  No murmur heard. Pulses:      Carotid pulses are 1+ on the right side, and 1+ on the left side. Pulmonary/Chest: Effort normal and breath sounds normal. No stridor. No respiratory distress. He has no wheezes. He has no rales.  Abdominal: Soft. Bowel sounds are normal. He exhibits no distension. There is tenderness. There is no rebound and no guarding.  Musculoskeletal: He exhibits no edema or tenderness.  Neurological: He is alert. He has normal strength. No cranial nerve deficit (no facial droop, extraocular movements intact, no slurred speech) or sensory deficit. He exhibits normal muscle tone. He displays no seizure activity. Coordination normal.  Skin: Skin is warm and dry. No rash noted.    Psychiatric: He has a normal mood and affect.  Nursing note and vitals reviewed.    ED Treatments / Results  Labs (all labs ordered are listed, but only abnormal results are displayed) Labs Reviewed  CBC - Abnormal; Notable for the following components:      Result Value   RBC 3.77 (*)    Hemoglobin 11.9 (*)    HCT 36.5 (*)    All other components within normal limits  ETHANOL - Abnormal; Notable for the following components:   Alcohol, Ethyl (B) 155 (*)    All other components within normal limits  COMPREHENSIVE METABOLIC PANEL - Abnormal; Notable for the following  components:   Sodium 127 (*)    Chloride 98 (*)    CO2 19 (*)    Glucose, Bld 100 (*)    Creatinine, Ser 1.39 (*)    Calcium 8.1 (*)    Total Protein 6.3 (*)    Albumin 2.8 (*)    AST 86 (*)    GFR calc non Af Amer 51 (*)    GFR calc Af Amer 59 (*)    All other components within normal limits  URINALYSIS, ROUTINE W REFLEX MICROSCOPIC - Abnormal; Notable for the following components:   Hgb urine dipstick SMALL (*)    Protein, ur 30 (*)    Bacteria, UA RARE (*)    Squamous Epithelial / LPF 0-5 (*)    All other components within normal limits  I-STAT TROPONIN, ED - Abnormal; Notable for the following components:   Troponin i, poc 0.25 (*)    All other components within normal limits  I-STAT CG4 LACTIC ACID, ED - Abnormal; Notable for the following components:   Lactic Acid, Venous 4.07 (*)    All other components within normal limits  I-STAT CHEM 8, ED - Abnormal; Notable for the following components:   Sodium 129 (*)    Chloride 97 (*)    Creatinine, Ser 1.40 (*)    Calcium, Ion 1.13 (*)    All other components within normal limits  I-STAT ARTERIAL BLOOD GAS, ED - Abnormal; Notable for the following components:   pCO2 arterial 25.5 (*)    pO2, Arterial 282.0 (*)    Bicarbonate 14.2 (*)    TCO2 15 (*)    Acid-base deficit 10.0 (*)    All other components within normal limits  LIPASE, BLOOD   PROTIME-INR  BLOOD GAS, ARTERIAL  BLOOD GAS, ARTERIAL  I-STAT TROPONIN, ED  POC OCCULT BLOOD, ED  I-STAT CG4 LACTIC ACID, ED  TYPE AND SCREEN    EKG  EKG Interpretation  Date/Time:  Sunday July 18 2017 19:05:56 EST Ventricular Rate:  35 PR Interval:    QRS Duration: 83 QT Interval:  517 QTC Calculation: 395 R Axis:   61 Text Interpretation:  Junctional rhythm, new since last tracing Anteroseptal infarct, age indeterminate Lateral leads are also involved Confirmed by Dorie Rank [phone]) on 07/14/2017 7:43:22 PM       Radiology Dg Abdomen 1 View  Result Date: 07/29/2017 CLINICAL DATA:  Orogastric tube. EXAM: ABDOMEN - 1 VIEW COMPARISON:  05/30/2015 FINDINGS: Exam demonstrates patient's enteric tube which has been pulled back and has tip over the stomach in the left upper quadrant and side port at the level of the gastroesophageal junction. Bowel gas pattern is nonobstructive. Remainder of the exam is unchanged. IMPRESSION: Nonobstructive bowel gas pattern. Enteric tube with tip over the stomach and side-port at the region of the gastroesophageal junction. This could be advanced approximately 8 cm. Electronically Signed   By: Marin Olp M.D.   On: 07/12/2017 20:32   Dg Chest Portable 1 View  Result Date: 07/31/2017 CLINICAL DATA:  66 y/o  M; endotracheal tube placement. EXAM: PORTABLE CHEST 1 VIEW COMPARISON:  06/05/2015 chest radiograph FINDINGS: Endotracheal tube 5.7 cm from carina. Enteric tube tip below the field of view in the abdomen. Transcutaneous pacing pads noted. Stable cardiac silhouette. Aortic atherosclerosis. Clear lungs. No acute osseous abnormality is evident. IMPRESSION: Endotracheal tube 5.7 cm from carina. Enteric tube tip below field of view and abdomen. Stable cardiac silhouette. Clear lungs. Electronically Signed   By: Mia Creek  [place] M.D.   On: 07/12/2017 20:19    Procedures .Critical Care Performed by: Dorie Rank, MD Authorized by: Dorie Rank, MD   Critical care provider statement:    Critical care time (minutes):  45   Critical care was necessary to treat or prevent imminent or life-threatening deterioration of the following conditions:  Circulatory failure   Critical care was time spent personally by me on the following activities:  Discussions with consultants, evaluation of patient's response to treatment, examination of patient, ordering and performing treatments and interventions, ordering and review of laboratory studies, ordering and review of radiographic studies, pulse oximetry, re-evaluation of patient's condition, obtaining history from patient or surrogate and review of old charts   (including critical care time)  Medications Ordered in ED Medications  sodium chloride 0.9 % bolus 1,000 mL (0 mLs Intravenous Stopped 07/29/2017 2011)    And  0.9 %  sodium chloride infusion ( Intravenous New Bag/Given 08/03/2017 1853)  iopamidol (ISOVUE-370) 76 % injection (not administered)  ondansetron (ZOFRAN) injection 4 mg (4 mg Intravenous Not Given 08/03/2017 1916)  EPINEPHrine (ADRENALIN) 4 mg in dextrose 5 % 250 mL (0.016 mg/mL) infusion (20 mcg/min Intravenous Rate/Dose Change 08/01/2017 2047)  piperacillin-tazobactam (ZOSYN) IVPB 3.375 g (not administered)  vancomycin (VANCOCIN) 1,250 mg in sodium chloride 0.9 % 250 mL IVPB (not administered)  piperacillin-tazobactam (ZOSYN) IVPB 3.375 g (not administered)  vancomycin (VANCOCIN) 500 mg in sodium chloride 0.9 % 100 mL IVPB (not administered)  DOPamine (INTROPIN) 800 mg in dextrose 5 % 250 mL (3.2 mg/mL) infusion (not administered)  sodium chloride 0.9 % bolus 1,000 mL (0 mLs Intravenous Stopped 07/21/2017 1947)  EPINEPHrine (ADRENALIN) 1 mg (1 mg Intravenous Given 08/01/2017 1909)  ondansetron (ZOFRAN) 4 MG/2ML injection (4 mg  Given 07/22/2017 1913)  norepinephrine (LEVOPHED) 4 mg in dextrose 5 % 250 mL (0.016 mg/mL) infusion (50 mcg/min Intravenous Rate/Dose Change 07/22/2017 2047)    atropine injection (1 mg Intravenous Given 07/14/2017 1919)  etomidate (AMIDATE) injection (20 mg Intravenous Given 07/17/2017 1921)  succinylcholine (ANECTINE) injection (100 mg Intravenous Given 07/10/2017 1922)  sodium chloride 0.9 % bolus 1,000 mL (1,000 mLs Intravenous New Bag/Given 07/10/2017 1955)  fentaNYL (SUBLIMAZE) 100 MCG/2ML injection (100 mcg  Given 07/11/2017 2010)     Initial Impression / Assessment and Plan / ED Course  I have reviewed the triage vital signs and the nursing notes.  Pertinent labs & imaging results that were available during my care of the patient were reviewed by me and considered in my medical decision making (see chart for details).   Patient presented to the emergency room with complaints of chest pain, hypotension and bradycardia.  His initial EKG was not diagnostic for a STEMI however I was concerned about the possibility of acute cardiac ischemia.  I did speak with Dr. Claiborne Billings.  He reviewed the EKGs.  We plan on trying to stabilize the patient and I contacted Dr. Harrell Gave, the cardiology fellow.  While the patient initially was able to speak and communicate when he first arrived in the ED he worsened rapidly.  Patient started complaining more difficulty breathing.  He appeared mottled he is becoming more bradycardic and hypotensive.  He was started on a Levophed infusion.  He was also given a dose of epinephrine.  Patient's blood pressure continued to drop.  We were able to get his blood pressure a  Little better after the Levophed infusion into the low 80s.  Patient was then intubated by Dr Levonne Lapping without difficulty  under my direct supervision.  Brief bedside ultrasound did not show any signs of aaa, free fluid.   Patient was placed on external pacing pads.  There was capture.  Because of his persistent hypotension he was then started on an epinephrine infusion.  His laboratory tests were reviewed.  Patient has signs of mild acute kidney injury but nothing  significant.  His hemoglobin is stable.  He does not had a leukocytosis.  ABG was consistent with a metabolic acidosis with good oxygenation.  Patient was not started on anti-anticoagulation.  Abx ordered to cover for possible sepsis.  Considering his vascular history of think we need to try to get a CT angios chest abdomen and pelvis before starting anticoagulation.  Critical care was consulted.  Cardiology at the bedside.  Patient will be admitted to the ICU in critical condition.  Presume cardiogenic shock.   Final Clinical Impressions(s) / ED Diagnoses   Final diagnoses:  [facility])      Dorie Rank, MD 07/16/2017 2246

## 2017-07-19 ENCOUNTER — Inpatient Hospital Stay (HOSPITAL_COMMUNITY): Payer: Medicare HMO

## 2017-07-19 DIAGNOSIS — I214 Non-ST elevation (NSTEMI) myocardial infarction: Principal | ICD-10-CM

## 2017-07-19 DIAGNOSIS — I361 Nonrheumatic tricuspid (valve) insufficiency: Secondary | ICD-10-CM

## 2017-07-19 LAB — MRSA PCR SCREENING: MRSA BY PCR: NEGATIVE

## 2017-07-19 LAB — URINALYSIS, MICROSCOPIC (REFLEX)

## 2017-07-19 LAB — BASIC METABOLIC PANEL
Anion gap: 9 (ref 5–15)
BUN: 16 mg/dL (ref 6–20)
CALCIUM: 6.9 mg/dL — AB (ref 8.9–10.3)
CO2: 22 mmol/L (ref 22–32)
CREATININE: 1.72 mg/dL — AB (ref 0.61–1.24)
Chloride: 99 mmol/L — ABNORMAL LOW (ref 101–111)
GFR calc non Af Amer: 40 mL/min — ABNORMAL LOW (ref >60)
GFR, EST AFRICAN AMERICAN: 46 mL/min — AB (ref >60)
Glucose, Bld: 149 mg/dL — ABNORMAL HIGH (ref 65–99)
Potassium: 3.7 mmol/L (ref 3.5–5.1)
Sodium: 130 mmol/L — ABNORMAL LOW (ref 135–145)

## 2017-07-19 LAB — GLUCOSE, CAPILLARY
GLUCOSE-CAPILLARY: 75 mg/dL (ref 65–99)
Glucose-Capillary: 150 mg/dL — ABNORMAL HIGH (ref 65–99)
Glucose-Capillary: 143 mg/dL — ABNORMAL HIGH (ref 65–99)
Glucose-Capillary: 129 mg/dL — ABNORMAL HIGH (ref 65–99)
Glucose-Capillary: 110 mg/dL — ABNORMAL HIGH (ref 65–99)

## 2017-07-19 LAB — BLOOD GAS, ARTERIAL
ACID-BASE DEFICIT: 6.8 mmol/L — AB (ref 0.0–2.0)
Bicarbonate: 18.3 mmol/L — ABNORMAL LOW (ref 20.0–28.0)
FIO2: 40
MECHVT: 580 mL
O2 SAT: 95.3 %
PATIENT TEMPERATURE: 98.6
PCO2 ART: 37.8 mmHg (ref 32.0–48.0)
PEEP: 5 cmH2O
PH ART: 7.306 — AB (ref 7.350–7.450)
PO2 ART: 90.8 mmHg (ref 83.0–108.0)
RATE: 14 resp/min

## 2017-07-19 LAB — OSMOLALITY: OSMOLALITY: 284 mosm/kg (ref 275–295)

## 2017-07-19 LAB — POCT I-STAT 3, ART BLOOD GAS (G3+)
Acid-base deficit: 1 mmol/L (ref 0.0–2.0)
BICARBONATE: 23.2 mmol/L (ref 20.0–28.0)
O2 Saturation: 99 %
PCO2 ART: 36.8 mmHg (ref 32.0–48.0)
PO2 ART: 148 mmHg — AB (ref 83.0–108.0)
Patient temperature: 37.9
TCO2: 24 mmol/L (ref 22–32)
pH, Arterial: 7.411 (ref 7.350–7.450)

## 2017-07-19 LAB — CBC
HCT: 33.3 % — ABNORMAL LOW (ref 39.0–52.0)
Hemoglobin: 10.9 g/dL — ABNORMAL LOW (ref 13.0–17.0)
MCH: 31 pg (ref 26.0–34.0)
MCHC: 32.7 g/dL (ref 30.0–36.0)
MCV: 94.6 fL (ref 78.0–100.0)
PLATELETS: 128 10*3/uL — AB (ref 150–400)
RBC: 3.52 MIL/uL — AB (ref 4.22–5.81)
RDW: 12.8 % (ref 11.5–15.5)
WBC: 8.6 10*3/uL (ref 4.0–10.5)

## 2017-07-19 LAB — RAPID URINE DRUG SCREEN, HOSP PERFORMED
Amphetamines: NOT DETECTED
Barbiturates: NOT DETECTED
Benzodiazepines: NOT DETECTED
COCAINE: NOT DETECTED
OPIATES: NOT DETECTED
TETRAHYDROCANNABINOL: NOT DETECTED

## 2017-07-19 LAB — URINALYSIS, ROUTINE W REFLEX MICROSCOPIC
Bilirubin Urine: NEGATIVE
GLUCOSE, UA: 100 mg/dL — AB
KETONES UR: 15 mg/dL — AB
Nitrite: NEGATIVE
PROTEIN: 100 mg/dL — AB
Specific Gravity, Urine: 1.015 (ref 1.005–1.030)
pH: 6 (ref 5.0–8.0)

## 2017-07-19 LAB — ECHOCARDIOGRAM COMPLETE
Height: 70 in
WEIGHTICAEL: 2497.37 [oz_av]

## 2017-07-19 LAB — CORTISOL

## 2017-07-19 LAB — TROPONIN I
TROPONIN I: 30.66 ng/mL — AB (ref <0.03)
Troponin I: 9.14 ng/mL (ref <0.03)

## 2017-07-19 LAB — OSMOLALITY, URINE
OSMOLALITY UR: 329 mosm/kg (ref 300–900)
OSMOLALITY UR: 435 mosm/kg (ref 300–900)

## 2017-07-19 LAB — PHOSPHORUS
PHOSPHORUS: 4.3 mg/dL (ref 2.5–4.6)
Phosphorus: 2.1 mg/dL — ABNORMAL LOW (ref 2.5–4.6)
Phosphorus: 4 mg/dL (ref 2.5–4.6)

## 2017-07-19 LAB — HEPARIN LEVEL (UNFRACTIONATED)
HEPARIN UNFRACTIONATED: 0.44 [IU]/mL (ref 0.30–0.70)
Heparin Unfractionated: 0.14 IU/mL — ABNORMAL LOW (ref 0.30–0.70)

## 2017-07-19 LAB — AMYLASE: Amylase: 113 U/L — ABNORMAL HIGH (ref 28–100)

## 2017-07-19 LAB — LACTIC ACID, PLASMA
LACTIC ACID, VENOUS: 7.1 mmol/L — AB (ref 0.5–1.9)
Lactic Acid, Venous: 3.4 mmol/L (ref 0.5–1.9)

## 2017-07-19 LAB — MAGNESIUM
MAGNESIUM: 1.8 mg/dL (ref 1.7–2.4)
Magnesium: 1.6 mg/dL — ABNORMAL LOW (ref 1.7–2.4)
Magnesium: 1.7 mg/dL (ref 1.7–2.4)

## 2017-07-19 LAB — PROCALCITONIN: PROCALCITONIN: 1.1 ng/mL

## 2017-07-19 LAB — AMMONIA: AMMONIA: 40 umol/L — AB (ref 9–35)

## 2017-07-19 MED ORDER — VITAL AF 1.2 CAL PO LIQD
1000.0000 mL | ORAL | Status: DC
  Administered 2017-07-19 – 2017-07-23 (×4): 1000 mL

## 2017-07-19 MED ORDER — FENTANYL CITRATE (PF) 100 MCG/2ML IJ SOLN: 50.0000 ug | Freq: Once | INTRAMUSCULAR | Status: AC

## 2017-07-19 MED ORDER — HEPARIN BOLUS VIA INFUSION
2000.0000 [IU] | Freq: Once | INTRAVENOUS | Status: AC
  Administered 2017-07-19: 2000 [IU] via INTRAVENOUS
  Filled 2017-07-19: qty 2000

## 2017-07-19 MED ORDER — FENTANYL BOLUS VIA INFUSION
25.0000 ug | INTRAVENOUS | Status: DC | PRN
  Administered 2017-07-19 – 2017-07-21 (×3): 25 ug via INTRAVENOUS
  Filled 2017-07-19: qty 25

## 2017-07-19 MED ORDER — NOREPINEPHRINE BITARTRATE 1 MG/ML IV SOLN
0.0000 ug/min | INTRAVENOUS | Status: DC
  Administered 2017-07-19: 10 ug/min via INTRAVENOUS
  Administered 2017-07-19: 6 ug/min via INTRAVENOUS
  Filled 2017-07-19 (×3): qty 16

## 2017-07-19 MED ORDER — POTASSIUM CHLORIDE 20 MEQ/15ML (10%) PO SOLN
20.0000 meq | Freq: Once | ORAL | Status: AC
  Administered 2017-07-19: 20 meq via ORAL
  Filled 2017-07-19: qty 15

## 2017-07-19 MED ORDER — HEPARIN (PORCINE) IN NACL 100-0.45 UNIT/ML-% IJ SOLN
1050.0000 [IU]/h | INTRAMUSCULAR | Status: AC
  Administered 2017-07-19: 850 [IU]/h via INTRAVENOUS
  Administered 2017-07-20 – 2017-07-22 (×3): 1050 [IU]/h via INTRAVENOUS
  Filled 2017-07-19 (×4): qty 250

## 2017-07-19 MED ORDER — SODIUM CHLORIDE 0.45 % IV SOLN
INTRAVENOUS | Status: DC
  Administered 2017-07-19: 10:00:00 via INTRAVENOUS

## 2017-07-19 MED ORDER — ORAL CARE MOUTH RINSE
15.0000 mL | Freq: Four times a day (QID) | OROMUCOSAL | Status: DC
Start: 2017-07-19 — End: 2017-07-19

## 2017-07-19 MED ORDER — ATROPINE SULFATE 1 MG/10ML IJ SOSY
PREFILLED_SYRINGE | INTRAMUSCULAR | Status: AC
  Filled 2017-07-19: qty 10

## 2017-07-19 MED ORDER — LORAZEPAM 2 MG/ML IJ SOLN
1.0000 mg | Freq: Four times a day (QID) | INTRAMUSCULAR | Status: DC
  Administered 2017-07-19 – 2017-07-21 (×3): 1 mg via INTRAVENOUS
  Filled 2017-07-19 (×3): qty 1

## 2017-07-19 MED ORDER — IBUPROFEN 100 MG/5ML PO SUSP
400.0000 mg | Freq: Four times a day (QID) | ORAL | Status: DC | PRN
  Administered 2017-07-21: 400 mg
  Filled 2017-07-19 (×3): qty 20

## 2017-07-19 MED ORDER — ASPIRIN 81 MG PO CHEW
81.0000 mg | CHEWABLE_TABLET | Freq: Every day | ORAL | Status: DC
  Administered 2017-07-19 – 2017-07-27 (×9): 81 mg via ORAL
  Filled 2017-07-19 (×9): qty 1

## 2017-07-19 MED ORDER — MAGNESIUM SULFATE 2 GM/50ML IV SOLN
2.0000 g | Freq: Once | INTRAVENOUS | Status: AC
Start: 2017-07-19 — End: 2017-07-19
  Administered 2017-07-19: 2 g via INTRAVENOUS
  Filled 2017-07-19: qty 50

## 2017-07-19 MED ORDER — INSULIN ASPART 100 UNIT/ML ~~LOC~~ SOLN
2.0000 [IU] | SUBCUTANEOUS | Status: DC
  Administered 2017-07-19 (×3): 2 [IU] via SUBCUTANEOUS

## 2017-07-19 MED ORDER — ORAL CARE MOUTH RINSE
15.0000 mL | OROMUCOSAL | Status: DC
  Administered 2017-07-19 – 2017-07-25 (×65): 15 mL via OROMUCOSAL

## 2017-07-19 MED ORDER — HEPARIN BOLUS VIA INFUSION
3000.0000 [IU] | Freq: Once | INTRAVENOUS | Status: AC
  Administered 2017-07-19: 3000 [IU] via INTRAVENOUS
  Filled 2017-07-19: qty 3000

## 2017-07-19 MED ORDER — FENTANYL 2500MCG IN NS 250ML (10MCG/ML) PREMIX INFUSION
25.0000 ug/h | INTRAVENOUS | Status: DC
  Administered 2017-07-19 (×2): 125 ug/h via INTRAVENOUS
  Administered 2017-07-19: 100 ug/h via INTRAVENOUS
  Administered 2017-07-19: 50 ug/h via INTRAVENOUS
  Administered 2017-07-20: 150 ug/h via INTRAVENOUS
  Administered 2017-07-20: 300 ug/h via INTRAVENOUS
  Administered 2017-07-21 – 2017-07-23 (×3): 150 ug/h via INTRAVENOUS
  Administered 2017-07-24: 200 ug/h via INTRAVENOUS
  Administered 2017-07-25: 250 ug/h via INTRAVENOUS
  Filled 2017-07-19 (×10): qty 250

## 2017-07-19 MED ORDER — PERFLUTREN LIPID MICROSPHERE
1.0000 mL | INTRAVENOUS | Status: AC | PRN
  Administered 2017-07-19: 2 mL via INTRAVENOUS
  Filled 2017-07-19: qty 10

## 2017-07-19 MED ORDER — MIDAZOLAM HCL 2 MG/2ML IJ SOLN
INTRAMUSCULAR | Status: AC
  Administered 2017-07-19: 2 mg
  Filled 2017-07-19: qty 2

## 2017-07-19 MED ORDER — MIDAZOLAM HCL 2 MG/2ML IJ SOLN
2.0000 mg | INTRAMUSCULAR | Status: DC | PRN
  Administered 2017-07-19 – 2017-07-22 (×8): 2 mg via INTRAVENOUS
  Filled 2017-07-19 (×8): qty 2

## 2017-07-19 MED ORDER — PRO-STAT SUGAR FREE PO LIQD
30.0000 mL | Freq: Two times a day (BID) | ORAL | Status: DC
  Administered 2017-07-19: 30 mL
  Filled 2017-07-19: qty 30

## 2017-07-19 MED ORDER — VITAL HIGH PROTEIN PO LIQD: 1000.0000 mL | ORAL | Status: DC

## 2017-07-19 MED ORDER — SODIUM CHLORIDE 0.9 % IV SOLN
1.0000 g | Freq: Once | INTRAVENOUS | Status: AC
  Administered 2017-07-19: 1 g via INTRAVENOUS
  Filled 2017-07-19: qty 10

## 2017-07-19 MED FILL — Medication: Qty: 1 | Status: AC

## 2017-07-19 NOTE — Care Management Note (Signed)
Case Management Note Marvetta Gibbons RN, BSN Unit 4E-Case Manager-- [place] coverage [phone]  Patient Details  Name: Adam Milliron Sr. MRN: [id number] Date of Birth: 27-Mar-1951  Subjective/Objective:    Pt found down at home by wife- admitted with chest pain/hypotension, bradycardia, hypoxia- intubated in the ED- 11/12-remains on vent- wean vasa drip              Action/Plan: PTA Pt lived at home with wife-independent,  hx ETOH, - CM to follow for d/c needs  Expected Discharge Date:                  Expected Discharge Plan:     In-House Referral:  Clinical Social Work  Discharge planning Services  CM Consult  Post Acute Care Choice:    Choice offered to:     DME Arranged:    DME Agency:     HH Arranged:    [place] Agency:     Status of Service:  In process, will continue to follow  If discussed at Long Length of Stay Meetings, dates discussed:    Discharge Disposition:   Additional Comments:  Dawayne Patricia, RN 07/19/2017, 11:55 AM

## 2017-07-19 NOTE — Progress Notes (Signed)
ANTICOAGULATION CONSULT NOTE - Initial Consult  Pharmacy Consult for Heparin Indication: chest pain/ACS  Allergies  Allergen Reactions  . Chlorhexidine Itching and Rash  . Codeine Itching and Rash    Patient Measurements: Height: 5\' 10"  (177.8 cm) Weight: 156 lb 1.4 oz (70.8 kg) IBW/kg (Calculated) : 73  Vital Signs: Temp: 100.6 F (38.1 C) (11/12 0630) Temp Source: Bladder (11/12 0300) BP: 131/91 (11/12 0630) Pulse Rate: 83 (11/12 0630)  Labs: Recent Labs    07/26/2017 1848 07/09/2017 1854 07/17/2017 1855 08/03/2017 2229 07/19/17 0447  HGB 11.9* 14.3  --   --  10.9*  HCT 36.5* 42.0  --   --  33.3*  PLT 246  --   --   --  128*  APTT  --   --   --  52*  --   LABPROT  --   --  14.6  --   --   INR  --   --  1.15  --   --   CREATININE 1.39* 1.40*  --  1.46* 1.72*  TROPONINI  --   --   --   --  9.14*    Estimated Creatinine Clearance: 42.3 mL/min (A) (by C-G formula based on SCr of 1.72 mg/dL (H)).   Medical History: Past Medical History:  Diagnosis Date  . Abdominal aortic aneurysm ([place])   . Congestive heart failure ([place])   . Coronary artery disease   . History of hiatal hernia   . HTN (hypertension) 05/28/2012  . Hypertension   . NSTEMI (non-ST elevated myocardial infarction). 05/28/12 05/28/2012  . Peripheral arterial disease ([place])   . Shortness of breath dyspnea   . Thrombocytopenia ([place]) 05/29/2012  . Tobacco abuse 05/28/2012    Medications:  Medications Prior to Admission  Medication Sig Dispense Refill Last Dose  . carvedilol (COREG) 12.5 MG tablet TAKE 1 AND 1/2 TABLETS (18.75 MG TOTAL) BY MOUTH TWICE DAILY WITH MEALS. 270 tablet 1 07/09/2017 at Unknown time  . hydrochlorothiazide (HYDRODIURIL) 25 MG tablet Take 12.5 mg daily by mouth.   07/17/2017 at Unknown time  . ipratropium (ATROVENT HFA) 17 MCG/ACT inhaler Inhale 1-2 puffs into the lungs every 6 (six) hours as needed for wheezing. 1 Inhaler 2 unk  . levalbuterol (XOPENEX) 0.63 MG/3ML nebulizer solution  Take 3 mLs (0.63 mg total) by nebulization every 6 (six) hours as needed for wheezing or shortness of breath. 3 mL 12 unk  . lisinopril (PRINIVIL,ZESTRIL) 20 MG tablet TAKE 1 TABLET (20 MG TOTAL) BY MOUTH DAILY. 90 tablet 1 07/27/2017 at Unknown time  . nitroGLYCERIN (NITROSTAT) 0.4 MG SL tablet Place 1 tablet (0.4 mg total) under the tongue every 5 (five) minutes as needed for chest pain (up to 3 tabs in 15 mins). 25 tablet 6 unk  . traMADol (ULTRAM) 50 MG tablet Take 1 tablet (50 mg total) by mouth every 6 (six) hours as needed. 30 tablet 0 unk  . zolpidem (AMBIEN) 5 MG tablet Take 1 tablet (5 mg total) by mouth at bedtime as needed for sleep. 20 tablet 0 unk  . atorvastatin (LIPITOR) 40 MG tablet TAKE 1 TABLET EVERY DAY  AT  6PM (Patient not taking: Reported on 07/13/2017) 90 tablet 1 Not Taking at Unknown time    Assessment: 66 y.o. male with VDRF/cardiogenic shock, elevated cardiac markers, for heparin   Goal of Therapy:  Heparin level 0.3-0.7 units/ml Monitor platelets by anticoagulation protocol: Yes   Plan:  Heparin 3000 units IV bolus, then start heparin  850 units/hr Check heparin level in 8 hours.   Caryl Pina 07/19/2017,6:46 AM

## 2017-07-19 NOTE — Progress Notes (Signed)
Progress Note  Patient Name: Adam Cefalu Sr. Date of Encounter: 07/19/2017  Primary Cardiologist:   Dr. Jovita Gamma  Subjective   Intubated but awake and alert.  Denies chest pain.   Inpatient Medications    Scheduled Meds: . folic acid  1 mg Intravenous Daily  . insulin aspart  2-6 Units Subcutaneous Q4H  . ipratropium-albuterol  3 mL Nebulization Q6H  . mouth rinse  15 mL Mouth Rinse QID  . ondansetron (ZOFRAN) IV  4 mg Intravenous Once  . pantoprazole (PROTONIX) IV  40 mg Intravenous QHS  . thiamine injection  100 mg Intravenous Daily  . THROMBI-PAD  1 each Topical Once   Continuous Infusions: . sodium chloride 125 mL/hr at 07/19/17 0017  . sodium chloride    . DOPamine 6 mcg/kg/min (07/19/17 0519)  . fentaNYL infusion INTRAVENOUS 100 mcg/hr (07/19/17 0134)  . heparin 850 Units/hr (07/19/17 0701)  . norepinephrine (LEVOPHED) Adult infusion Stopped (07/19/17 0300)  .  sodium bicarbonate  infusion 1000 mL 150 mL/hr at 07/19/17 0706  . vasopressin (PITRESSIN) infusion - *FOR SHOCK* 0.03 Units/min (07/19/17 0017)   PRN Meds: sodium chloride, fentaNYL, ibuprofen   Vital Signs    Vitals:   07/19/17 0645 07/19/17 0700 07/19/17 0715 07/19/17 0730  BP: 129/83 125/88 118/82 121/85  Pulse: 81 80 80 80  Resp: 14 14 15 14   Temp: (!) 100.4 F (38 C) (!) 100.4 F (38 C) (!) 100.4 F (38 C) (!) 100.4 F (38 C)  TempSrc:      SpO2: 100% 100% 100% 100%  Weight:      Height:        Intake/Output Summary (Last 24 hours) at 07/19/2017 0805 Last data filed at 07/19/2017 0800 Gross per 24 hour  Intake 4320.37 ml  Output 705 ml  Net 3615.37 ml   Filed Weights   08/06/2017 1837 07/19/17 0500  Weight: 140 lb (63.5 kg) 156 lb 1.4 oz (70.8 kg)    Telemetry    NSR - Personally Reviewed  ECG    NA - Personally Reviewed  Physical Exam   GEN:   Intubated but in no distress Neck: No  JVD Cardiac: RRR, no murmurs, rubs, or gallops.  Respiratory:    Decreased breath sounds GI: Soft, nontender, non-distended  MS: No  edema; No deformity. Neuro:  Nonfocal  Psych:  Unable to assess  Labs    Chemistry Recent Labs  Lab 07/19/2017 1848 07/17/2017 1854 07/31/2017 2229 07/19/17 0447  NA 127* 129* 128* 130*  K 4.7 5.0 5.3* 3.7  CL 98* 97* 102 99*  CO2 19*  --  13* 22  GLUCOSE 100* 94 195* 149*  BUN 13 17 13 16   CREATININE 1.39* 1.40* 1.46* 1.72*  CALCIUM 8.1*  --  6.0* 6.9*  PROT 6.3*  --   --   --   ALBUMIN 2.8*  --   --   --   AST 86*  --   --   --   ALT 38  --   --   --   ALKPHOS 123  --   --   --   BILITOT 0.7  --   --   --   GFRNONAA 51*  --  48* 40*  GFRAA 59*  --  56* 46*  ANIONGAP 10  --  13 9     Hematology Recent Labs  Lab 07/17/2017 1848 08/06/2017 1854 07/19/17 0447  WBC 7.6  --  8.6  RBC 3.77*  --  3.52*  HGB 11.9* 14.3 10.9*  HCT 36.5* 42.0 33.3*  MCV 96.8  --  94.6  MCH 31.6  --  31.0  MCHC 32.6  --  32.7  RDW 13.1  --  12.8  PLT 246  --  128*    Cardiac Enzymes Recent Labs  Lab 07/19/17 0447  TROPONINI 9.14*    Recent Labs  Lab 07/28/2017 1906 08/06/2017 2121  TROPIPOC 0.25* 0.29*     BNPNo results for input(s): BNP, PROBNP in the last 168 hours.   DDimer No results for input(s): DDIMER in the last 168 hours.   Radiology    Dg Abdomen 1 View  Result Date: 07/14/2017 CLINICAL DATA:  Orogastric tube. EXAM: ABDOMEN - 1 VIEW COMPARISON:  05/30/2015 FINDINGS: Exam demonstrates patient's enteric tube which has been pulled back and has tip over the stomach in the left upper quadrant and side port at the level of the gastroesophageal junction. Bowel gas pattern is nonobstructive. Remainder of the exam is unchanged. IMPRESSION: Nonobstructive bowel gas pattern. Enteric tube with tip over the stomach and side-port at the region of the gastroesophageal junction. This could be advanced approximately 8 cm. Electronically Signed   By: Marin Olp M.D.   On: 07/26/2017 20:32   Ct Head Wo Contrast  Result  Date: 08/01/2017 CLINICAL DATA:  Found face down for unknown period of time. History of hypertension. EXAM: CT HEAD WITHOUT CONTRAST TECHNIQUE: Contiguous axial images were obtained from the base of the skull through the vertex without intravenous contrast. COMPARISON:  None. FINDINGS: BRAIN: No intraparenchymal hemorrhage, mass effect nor midline shift. Moderate to severe parenchymal brain volume loss. No hydrocephalus. Patchy supratentorial white matter hypodensities within normal range for patient's age, though non-specific are most compatible with chronic small vessel ischemic disease. No acute large vascular territory infarcts. No abnormal extra-axial fluid collections. Basal cisterns are patent. VASCULAR: Severe calcific atherosclerosis of the carotid siphons. SKULL: No skull fracture. No significant scalp soft tissue swelling. SINUSES/ORBITS: The mastoid air-cells and included paranasal sinuses are well-aerated.The included ocular globes and orbital contents are non-suspicious. Symmetrically enlarged superior ophthalmic veins associated with positive end pressure ventilation. OTHER: Life-support lines in place. IMPRESSION: 1. No acute intracranial process. 2. Moderate to severe parenchymal brain volume loss, advanced for age. 3. Mild chronic small vessel ischemic disease. 4. Severe atherosclerosis. Electronically Signed   By: Elon Alas M.D.   On: 07/15/2017 23:22   Dg Chest Port 1 View  Result Date: 07/19/2017 CLINICAL DATA:  Respiratory failure EXAM: PORTABLE CHEST 1 VIEW COMPARISON:  Yesterday FINDINGS: Endotracheal tube, NG tube, right jugular central venous catheter are stable. Right infrahilar mass is stable. Right suprahilar opacity is stable. Left lung is clear. IMPRESSION: Stable examination. Right infrahilar mass. Right suprahilar opacity. Electronically Signed   By: Marybelle Killings M.D.   On: 07/19/2017 07:29   Dg Chest Portable 1 View  Result Date: 07/17/2017 CLINICAL DATA:  Central  line placement. EXAM: PORTABLE CHEST 1 VIEW COMPARISON:  Chest radiograph July 18, 2017 at 1912 hours FINDINGS: RIGHT internal jugular central venous catheter tip projects in distal superior vena cava. Endotracheal tube tip projects 5.3 cm above the carina. Nasogastric tube past GE junction, distal tip out of field-of-view. The cardiac silhouette is mildly enlarged. Calcified aortic knob. No pleural effusion or focal consolidation. No pneumothorax. Soft tissue planes and included osseous structures are nonsuspicious. IMPRESSION: RIGHT internal jugular central venous catheter distal tip projects in distal superior vena cava. No pneumothorax. No apparent change  of remaining life support lines. Mild cardiomegaly.  No acute pulmonary process. Aortic Atherosclerosis (ICD10-I70.0). Electronically Signed   By: Elon Alas M.D.   On: 08/06/2017 23:58   Dg Chest Portable 1 View  Result Date: 07/29/2017 CLINICAL DATA:  66 y/o  M; endotracheal tube placement. EXAM: PORTABLE CHEST 1 VIEW COMPARISON:  06/05/2015 chest radiograph FINDINGS: Endotracheal tube 5.7 cm from carina. Enteric tube tip below the field of view in the abdomen. Transcutaneous pacing pads noted. Stable cardiac silhouette. Aortic atherosclerosis. Clear lungs. No acute osseous abnormality is evident. IMPRESSION: Endotracheal tube 5.7 cm from carina. Enteric tube tip below field of view and abdomen. Stable cardiac silhouette. Clear lungs. Electronically Signed   By: Kristine Garbe M.D.   On: 07/17/2017 20:19   Ct Angio Chest/abd/pel For Dissection W And/or Wo Contrast  Result Date: 07/16/2017 CLINICAL DATA:  66 y/o  M; found face down with central chest pain. EXAM: CT ANGIOGRAPHY CHEST, ABDOMEN AND PELVIS TECHNIQUE: Multidetector CT imaging through the chest, abdomen and pelvis was performed using the standard protocol during bolus administration of intravenous contrast. Multiplanar reconstructed images and MIPs were obtained and  reviewed to evaluate the vascular anatomy. CONTRAST:  141mL ISOVUE-370 IOPAMIDOL (ISOVUE-370) INJECTION 76% COMPARISON:  05/02/2015 CT of the abdomen and pelvis. FINDINGS: CTA CHEST FINDINGS Cardiovascular: Preferential opacification of the thoracic aorta. No evidence of thoracic aortic aneurysm or dissection. Moderate calcific atherosclerosis. Mixed plaque of left common carotid artery origin with moderate 50% stenosis. Mild cardiomegaly. Severe coronary artery calcification. Mediastinum/Nodes: Sub- carinal lymph node measuring 16 x 26 mm (series 6, image 53). Otherwise no mediastinal adenopathy. Enteric tube tip in the gastric body. Patulous esophagus. Normal thyroid gland. Lungs/Pleura: Right upper lobe cavitary nodule measuring 2.7 x 2.8 x 2.9 cm (AP x ML x CC series 6, image 36 and series 9, image 92). The nodule is contiguous with suprahilar airways and pulmonary arteries. A segmental right upper lobe pulmonary artery is attenuated as it traverses the mass and several airways are occluded (series 9, image 75). Contiguous soft tissue extending into the right hilum abuts the carina posteriorly (series 6, image 43). Right lower lobe mass measuring 3.7 x 2.9 x 3.7 cm (series 6, image 70 and series 9, image 91). The mass occludes at the develops segmental bronchi. 12 mm discrete satellite nodule inferiorly and in the right lower lobe (series 6, image 77). Musculoskeletal: No acute fracture identified. Mild multilevel degenerative changes of the spine Review of the MIP images confirms the above findings. CTA ABDOMEN AND PELVIS FINDINGS VASCULAR Aorta: Infrarenal abdominal aortic aneurysm repair measuring up to 3.2 cm. Extensive fibrofatty plaque with plaque ulceration. Celiac: Patent celiac origin. Diffuse attenuation of the patent splenic and hepatic arteries. SMA: Severe stenosis of SMA approximately 3.7 cm downstream to the origin (series 10, image 83). Renals: Severe atherosclerotic disease of bilateral renal  artery is with long segments of severe stenosis/ near occlusion. IMA: Occluded. Inflow: Bilateral aortofemoral bypass are patent. Severe stenosis of proximal femoral arteries bilaterally. Veins: No obvious venous abnormality within the limitations of this arterial phase study. Review of the MIP images confirms the above findings. NON-VASCULAR Hepatobiliary: Hepatic steatosis. Reflux of contrast into the liver may represent heart failure. Severe gallbladder wall thickening. Layering density within the gallbladder may represent stones or vicarious excretion of contrast. Pancreas: Diffuse edema surrounding the pancreas. Suboptimal contrast timing to evaluate for pancreatic necrosis. No pancreatic ductal dilatation. Spleen: Normal in size without focal abnormality. Adrenals/Urinary Tract: No focal kidney lesion.  No hydronephrosis. The bladder is collapsed around Foley catheter. Normal adrenal glands. Stomach/Bowel: No obstructive or inflammatory changes of the small and large bowel. Normal stomach. Edema surrounding the duodenum probably related to pancreatic inflammation or gallbladder inflammation. Lymphatic: No lymphadenopathy identified. Reproductive: Moderate prostate enlargement Other: Small volume of ascites. Musculoskeletal: No acute fracture identified. Mild multilevel degenerative changes of the spine. Review of the MIP images confirms the above findings. IMPRESSION: CTA chest: 1. 2.9 cm right upper lobe cavitary spiculated nodule and 3.7 cm right lower lobe mass. Findings probably represent synchronous or primary/metastatic lung cancer. Distant metastatic disease is less likely. The right upper lobe lesion invades the hilum. There is a satellite nodule inferior to the right lower lobe lesion. Sub- carinal lymphadenopathy. 2. No aortic dissection. 3. Left common carotid artery origin fibrofatty plaque with moderate 50% stenosis. 4. Cardiomegaly and severe coronary artery calcification. CT abdomen and pelvis:  1. Diffuse gallbladder wall thickening and extensive inflammation surrounding the pancreas. Inflammation is likely due to acute pancreatitis with either reactive changes of the gallbladder or concurrent acute cholecystitis. 2. No aortic dissection. 3. Infrarenal abdominal aortic repair with patent bilateral aortofemoral bypass. Extensive atherosclerotic disease with severe proximal femoral artery stenosis, severe mid SMA stenosis, severe bilateral renal artery stenosis. 4. Layering density within the gallbladder may represent small stones or vicarious excretion of contrast. 5. Small volume of ascites. These results will be called to the ordering clinician or representative by the Radiologist Assistant, and communication documented in the PACS or zVision Dashboard. Electronically Signed   By: Kristine Garbe M.D.   On: 07/22/2017 23:50    Cardiac Studies   Echo pending.   Patient Profile     65 y.o. male with PMH CAD s/p RCA stent 2013, AAA s/p open repair 2016 with aortobifemoral bypass grafts, HTN, dyslipidemia seen in consult at the request of Dr. Tomi Bamberger for bradycardia, hypotension, and syncope.   Assessment & Plan    CARDIOGENIC SHOCK:  Questionable acute coronary syndrome vs sepsis.  Continuing support and able to wean.  He will need eventual cath.   BRADYCARDIA:  This was evident in the ED but resolved with maximal hemodynamic and ventilatory support.  No indication for temp pacing.   Pressors being weaned.  HR has recovered.   NSTEMI:  Was complaining of chest pain prior to admission.   Creat bump.   Heparin started.  Start ASA.   LUNG MASS:  New.  Work up per primary team.  AKI:  Slight bump in creat continue to follow.     For questions or updates, please contact [place] Please consult [email or web address] for contact info under Cardiology/STEMI.   Signed, Minus Breeding, MD  07/19/2017, 8:05 AM

## 2017-07-19 NOTE — Progress Notes (Signed)
  Echocardiogram 2D Echocardiogram with Definity has been performed.  Tresa Res 07/19/2017, 10:15 AM

## 2017-07-19 NOTE — Progress Notes (Signed)
Initial Nutrition Assessment  DOCUMENTATION CODES:   Non-severe (moderate) malnutrition in context of chronic illness  INTERVENTION:   Tube Feeding:   Vital AF 1.2 @ 65 ml/hr providing 1872 kcals, 117 g of protein and 1264 mL of free water  NUTRITION DIAGNOSIS:   Moderate Malnutrition related to chronic illness(lung mass highly suspicious for malignancy, hx of EtOH abuse) as evidenced by moderate fat depletion, mild muscle depletion.  GOAL:   Provide needs based on ASPEN/SCCM guidelines  MONITOR:   TF tolerance, Vent status, Labs, Weight trends  REASON FOR ASSESSMENT:   Consult, Ventilator Enteral/tube feeding initiation and management  ASSESSMENT:   66 yo male admitted after being found unresponsive with acute respiratory failure requiring intubation (CT chest with 2.9 cm RUL cavitary spiculated nodule and 3.7 cm RLL mass highly suspicious for malignancy, septic vs cardiogenic shock. Pt with hx of CAD s/p RCA stent in 2013, AAA s/p open repair in 2016, HTN, HLD, EtOH abuse  Patient is currently intubated on ventilator support; currently on levophed and dopamine, fentanyl for sedation MV: 10.7 L/min Temp (24hrs), Avg:99.5 F (37.5 C), Min:96.1 F (35.6 C), Max:100.8 F (38.2 C)  CIWA protocol; pt is a heavy drinker per family report  Labs: sodium 130, Creatinine 1.72 Meds: 1/2 NS at 50 ml/hr  NUTRITION - FOCUSED PHYSICAL EXAM:    Most Recent Value  Orbital Region  Moderate depletion  Upper Arm Region  Moderate depletion  Thoracic and Lumbar Region  No depletion  Buccal Region  Unable to assess  Temple Region  Moderate depletion  Clavicle Bone Region  Mild depletion  Clavicle and Acromion Bone Region  Mild depletion  Scapular Bone Region  Mild depletion  Dorsal Hand  Unable to assess  Patellar Region  Moderate depletion  Anterior Thigh Region  Mild depletion  Posterior Calf Region  Mild depletion  Edema (RD Assessment)  Mild  Hair  Reviewed  Eyes  Unable  to assess  Mouth  Unable to assess  Skin  Reviewed  Nails  Reviewed       Diet Order:  Diet NPO time specified  EDUCATION NEEDS:   Not appropriate for education at this time  Skin:  Skin Assessment: Reviewed RN Assessment  Last BM:  no documented BM  Height:   Ht Readings from Last 1 Encounters:  07/09/2017 5\' 10"  (1.778 m)    Weight:   Wt Readings from Last 1 Encounters:  07/19/17 156 lb 1.4 oz (70.8 kg)    Ideal Body Weight:     BMI:  Body mass index is 22.4 kg/m.  Estimated Nutritional Needs:   Kcal:  1928 kcals  Protein:  107-142 g  Fluid:  >/= 2 L  Kerman Passey MS, RD, LDN, CNSC [phone] Pager  [phone] Weekend/On-Call Pager

## 2017-07-19 NOTE — Progress Notes (Signed)
eLink Physician-Brief Progress Note Patient Name: Adam Tashiro Sr. DOB: 1951/07/14 MRN: [id number]   Date of Service  07/19/2017  HPI/Events of Note  Fever to 100.6 F. AST elevated, therefore, can't use Tylenol. Creatinine = 1.46.  eICU Interventions  Will order: 1. Motrin Suspension 400 mg per tube Q 6 hours PRN Temp > 101.0 F.      Intervention Category Major Interventions: Infection - evaluation and management  Sommer,Steven Eugene 07/19/2017, 5:24 AM

## 2017-07-19 NOTE — Progress Notes (Signed)
eLink Physician-Brief Progress Note Patient Name: Adam Sittner Sr. DOB: March 30, 1951 MRN: [id number]   Date of Service  07/19/2017  HPI/Events of Note  Hypotension - BP = 73/52. Already on Dopamine at 5 mcg/kg/min. Can't r/o ischemia as etiology of hypotension and bradycardia. Cardiology already following the patient.   eICU Interventions  Restart Norepinephrine IV infusion already ordered.      Intervention Category Major Interventions: Hypotension - evaluation and management  Jhordan Mckibben Eugene 07/19/2017, 7:52 PM

## 2017-07-19 NOTE — Progress Notes (Signed)
[place] for Heparin Indication: chest pain/ACS  Allergies  Allergen Reactions  . Chlorhexidine Itching and Rash  . Codeine Itching and Rash    Patient Measurements: Height: 5\' 10"  (177.8 cm) Weight: 156 lb 1.4 oz (70.8 kg) IBW/kg (Calculated) : 73  Vital Signs: Temp: 98.6 F (37 C) (11/12 1345) Temp Source: Oral (11/12 0914) BP: 123/102 (11/12 1330) Pulse Rate: 72 (11/12 1345)  Labs: Recent Labs    07/19/2017 1848 08/01/2017 1854 08/04/2017 1855 08/04/2017 2229 07/19/17 0447 07/19/17 1329  HGB 11.9* 14.3  --   --  10.9*  --   HCT 36.5* 42.0  --   --  33.3*  --   PLT 246  --   --   --  128*  --   APTT  --   --   --  52*  --   --   LABPROT  --   --  14.6  --   --   --   INR  --   --  1.15  --   --   --   HEPARINUNFRC  --   --   --   --   --  0.14*  CREATININE 1.39* 1.40*  --  1.46* 1.72*  --   TROPONINI  --   --   --   --  9.14*  --     Estimated Creatinine Clearance: 42.3 mL/min (A) (by C-G formula based on SCr of 1.72 mg/dL (H)).   Medical History: Past Medical History:  Diagnosis Date  . Abdominal aortic aneurysm ([place])   . Congestive heart failure ([place])   . Coronary artery disease   . History of hiatal hernia   . HTN (hypertension) 05/28/2012  . Hypertension   . NSTEMI (non-ST elevated myocardial infarction). 05/28/12 05/28/2012  . Peripheral arterial disease ([place])   . Shortness of breath dyspnea   . Thrombocytopenia ([place]) 05/29/2012  . Tobacco abuse 05/28/2012   Assessment: 66 y.o. male with VDRF/cardiogenic shock, elevated cardiac markers, for heparin.  Heparin level below goal this afternoon on 850 units/hr. No bleeding issues noted. Will adjust.    Goal of Therapy:  Heparin level 0.3-0.7 units/ml Monitor platelets by anticoagulation protocol: Yes   Plan:  Heparin 2000 units IV bolus, then increase heparin to 1050 units/hr Check heparin level in 8 hours.   Erin Hearing PharmD., BCPS Clinical Pharmacist Pager  [phone] 07/19/2017 2:08 PM

## 2017-07-19 NOTE — Progress Notes (Signed)
CRITICAL VALUE ALERT  Critical Value:  Trop 9.14  Date & Time Notied:  07/19/2017 0625   Provider Notified: [facility] Dorene Ar)  Orders Received/Actions taken: Will start Heparin.

## 2017-07-19 NOTE — Progress Notes (Addendum)
Pt with two episodes of seizure like activity followed by a dramatic drop in HR and blood pressure. Elink-Dr Sommers present via camera post event. Levophed restarted. Instructed to call cards to r/o ischemia. Cards fellow [facility] paged and spoke with. Orders received. Will continue to monitor closely. Eleonore Chiquito RN 2 Heart

## 2017-07-19 NOTE — Progress Notes (Signed)
Brief  Notes,  Nurse  Called  Regarding    recetn hypotension  And  Levo gtt  Initiated, BP 130/70 , HR  70 BP   NSR, pt  On  Dopamine  Gtt, off  Levo  troponon  cardaic  Enzymes  x3  Being  Trended, pt  Intubated  earleir  For  Respiratory  Failure,  Echo  EF  65% Per  earluier  Cardiology  Plan  possibel cardiogenic  Shock  Vs  Sepsis,  Pt on  Hep gtt, trop  Noted  At  9,14,  Will infor  AM team  For  Possible  Cardiac  Cath

## 2017-07-19 NOTE — H&P (Signed)
PULMONARY / CRITICAL CARE MEDICINE   Name: Keven Osborn Sr. MRN: [id number] DOB: 1951-07-31    ADMISSION DATE:  07/26/2017 CONSULTATION DATE:  07/31/2017  REFERRING MD:  Dr. Tomi Bamberger  CHIEF COMPLAINT:  Chest Pain   HISTORY OF PRESENT ILLNESS:   66 year old male with PMH of CAD s/p RCA stent 2013, AAA s/p open repair in 2016 with aortiobifemoral bypass graft, HTN, HLD, ETOH  Presents to ED on 11/11 after wife found patient face down for unknown time. When EMS arrived patient complaining of central chest pain/syncope. Family states he took an unknown amount of nitro tablets. EMS gave 9 mcg EPI and 1 mg Atropine for Bradycardia and Hypotension. Upon arrival to ED patient is diaphoretic and non-rebreather. Intubated in ED. ABG 7.353/25.5/282. ETOH 155. BP Systolic 92-44, on external pacer, underlying HR 15. Cardiology consulted. PCCM asked to admit.   Upon arrival to ED patient is on 20 Levophed with Systolic 40 and currently on external pacer. When looking at [facility] bottle, patient only missing 4 tablets (family states they are sure he took one on Halloween)  Family reports for the last few months patient has been complaining of progressive dyspnea, over the last couple of weeks intermittent chest pain. Per family patient drinks heavily.      SUBJECTIVE:  Got external paced After intubation, brady better Off epi Off levo Remains on vent and vaso  VITAL SIGNS: BP (!) 158/87   Pulse 85   Temp (!) 100.4 F (38 C)   Resp 17   Ht _0  (1.778 m)   Wt 70.8 kg (156 lb 1.4 oz)   SpO2 100%   BMI 22.40 kg/m   HEMODYNAMICS: CVP:  [4 mmHg-12 mmHg] 9 mmHg  VENTILATOR SETTINGS: Vent Mode: PRVC FiO2 (%):  [40 %-100 %] 40 % Set Rate:  [14 bmp] 14 bmp Vt Set:  [580 mL] 580 mL PEEP:  [5 cmH20] 5 cmH20 Plateau Pressure:  [14 cmH20-22 cmH20] 18 cmH20  INTAKE / OUTPUT: I/O last 3 completed shifts: In: 4283.1 [I.V.:983.1; IV [id number]:1165] Out: 790 [Urine:645]  PHYSICAL  EXAMINATION: General: rass 2 Neuro: follows commands, moves all ext equally HEENT: jvd wnl PULM: reduced CV: s1 s2 RRR 75 no r GI: abdo soft, no pain, no r/g Extremities: no rash, no edema, some bruising arms    LABS:  BMET Recent Labs  Lab 07/11/2017 1848 07/15/2017 1854 08/01/2017 2229 07/19/17 0447  NA 127* 129* 128* 130*  K 4.7 5.0 5.3* 3.7  CL 98* 97* 102 99*  CO2 19*  --  13* 22  BUN _1 CREATININE 1.39* 1.40* 1.46* 1.72*  GLUCOSE 100* 94 195* 149*    Electrolytes Recent Labs  Lab 08/01/2017 1848 07/22/2017 2229 07/19/17 0447  CALCIUM 8.1* 6.0* 6.9*  MG  --  1.3* 1.8  PHOS  --  5.7* 2.1*    CBC Recent Labs  Lab 07/31/2017 1848 07/10/2017 1854 07/19/17 0447  WBC 7.6  --  8.6  HGB 11.9* 14.3 10.9*  HCT 36.5* 42.0 33.3*  PLT 246  --  128*    Coag's Recent Labs  Lab 08/06/2017 1855 07/10/2017 2229  APTT  --  52*  INR 1.15  --     Sepsis Markers Recent Labs  Lab 07/15/2017 2123 08/04/2017 2229 07/30/2017 2303 07/19/17 0447  LATICACIDVEN 8.13*  --  7.1* 3.4*  PROCALCITON  --  <0.10  --  1.10    ABG Recent Labs  Lab 08/06/2017 1856  07/19/2017 2102 07/19/17 0130  PHART 7.353 7.138* 7.306*  PCO2ART 25.5* 28.4* 37.8  PO2ART 282.0* 454.0* 90.8    Liver Enzymes Recent Labs  Lab 07/15/2017 1848  AST 86*  ALT 38  ALKPHOS 123  BILITOT 0.7  ALBUMIN 2.8*    Cardiac Enzymes Recent Labs  Lab 07/19/17 0447  TROPONINI 9.14*    Glucose Recent Labs  Lab 07/19/17 0159 07/19/17 0314  GLUCAP 150* 143*    Imaging Dg Abdomen 1 View  Result Date: 07/13/2017 CLINICAL DATA:  Orogastric tube. EXAM: ABDOMEN - 1 VIEW COMPARISON:  05/30/2015 FINDINGS: Exam demonstrates patient's enteric tube which has been pulled back and has tip over the stomach in the left upper quadrant and side port at the level of the gastroesophageal junction. Bowel gas pattern is nonobstructive. Remainder of the exam is unchanged. IMPRESSION: Nonobstructive bowel gas pattern.  Enteric tube with tip over the stomach and side-port at the region of the gastroesophageal junction. This could be advanced approximately 8 cm. Electronically Signed   By: Marin Olp M.D.   On: 07/12/2017 20:32   Ct Head Wo Contrast  Result Date: 07/24/2017 CLINICAL DATA:  Found face down for unknown period of time. History of hypertension. EXAM: CT HEAD WITHOUT CONTRAST TECHNIQUE: Contiguous axial images were obtained from the base of the skull through the vertex without intravenous contrast. COMPARISON:  None. FINDINGS: BRAIN: No intraparenchymal hemorrhage, mass effect nor midline shift. Moderate to severe parenchymal brain volume loss. No hydrocephalus. Patchy supratentorial white matter hypodensities within normal range for patient's age, though non-specific are most compatible with chronic small vessel ischemic disease. No acute large vascular territory infarcts. No abnormal extra-axial fluid collections. Basal cisterns are patent. VASCULAR: Severe calcific atherosclerosis of the carotid siphons. SKULL: No skull fracture. No significant scalp soft tissue swelling. SINUSES/ORBITS: The mastoid air-cells and included paranasal sinuses are well-aerated.The included ocular globes and orbital contents are non-suspicious. Symmetrically enlarged superior ophthalmic veins associated with positive end pressure ventilation. OTHER: Life-support lines in place. IMPRESSION: 1. No acute intracranial process. 2. Moderate to severe parenchymal brain volume loss, advanced for age. 3. Mild chronic small vessel ischemic disease. 4. Severe atherosclerosis. Electronically Signed   By: Elon Alas M.D.   On: 07/24/2017 23:22   Dg Chest Port 1 View  Result Date: 07/19/2017 CLINICAL DATA:  Respiratory failure EXAM: PORTABLE CHEST 1 VIEW COMPARISON:  Yesterday FINDINGS: Endotracheal tube, NG tube, right jugular central venous catheter are stable. Right infrahilar mass is stable. Right suprahilar opacity is stable.  Left lung is clear. IMPRESSION: Stable examination. Right infrahilar mass. Right suprahilar opacity. Electronically Signed   By: Marybelle Killings M.D.   On: 07/19/2017 07:29   Dg Chest Portable 1 View  Result Date: 07/29/2017 CLINICAL DATA:  Central line placement. EXAM: PORTABLE CHEST 1 VIEW COMPARISON:  Chest radiograph July 18, 2017 at 1912 hours FINDINGS: RIGHT internal jugular central venous catheter tip projects in distal superior vena cava. Endotracheal tube tip projects 5.3 cm above the carina. Nasogastric tube past GE junction, distal tip out of field-of-view. The cardiac silhouette is mildly enlarged. Calcified aortic knob. No pleural effusion or focal consolidation. No pneumothorax. Soft tissue planes and included osseous structures are nonsuspicious. IMPRESSION: RIGHT internal jugular central venous catheter distal tip projects in distal superior vena cava. No pneumothorax. No apparent change of remaining life support lines. Mild cardiomegaly.  No acute pulmonary process. Aortic Atherosclerosis (ICD10-I70.0). Electronically Signed   By: Elon Alas M.D.   On: 07/30/2017 23:58  Dg Chest Portable 1 View  Result Date: 07/16/2017 CLINICAL DATA:  66 y/o  M; endotracheal tube placement. EXAM: PORTABLE CHEST 1 VIEW COMPARISON:  06/05/2015 chest radiograph FINDINGS: Endotracheal tube 5.7 cm from carina. Enteric tube tip below the field of view in the abdomen. Transcutaneous pacing pads noted. Stable cardiac silhouette. Aortic atherosclerosis. Clear lungs. No acute osseous abnormality is evident. IMPRESSION: Endotracheal tube 5.7 cm from carina. Enteric tube tip below field of view and abdomen. Stable cardiac silhouette. Clear lungs. Electronically Signed   By: Kristine Garbe M.D.   On: 07/10/2017 20:19   Ct Angio Chest/abd/pel For Dissection W And/or Wo Contrast  Result Date: 07/31/2017 CLINICAL DATA:  66 y/o  M; found face down with central chest pain. EXAM: CT ANGIOGRAPHY CHEST,  ABDOMEN AND PELVIS TECHNIQUE: Multidetector CT imaging through the chest, abdomen and pelvis was performed using the standard protocol during bolus administration of intravenous contrast. Multiplanar reconstructed images and MIPs were obtained and reviewed to evaluate the vascular anatomy. CONTRAST:  132m ISOVUE-370 IOPAMIDOL (ISOVUE-370) INJECTION 76% COMPARISON:  05/02/2015 CT of the abdomen and pelvis. FINDINGS: CTA CHEST FINDINGS Cardiovascular: Preferential opacification of the thoracic aorta. No evidence of thoracic aortic aneurysm or dissection. Moderate calcific atherosclerosis. Mixed plaque of left common carotid artery origin with moderate 50% stenosis. Mild cardiomegaly. Severe coronary artery calcification. Mediastinum/Nodes: Sub- carinal lymph node measuring 16 x 26 mm (series 6, image 53). Otherwise no mediastinal adenopathy. Enteric tube tip in the gastric body. Patulous esophagus. Normal thyroid gland. Lungs/Pleura: Right upper lobe cavitary nodule measuring 2.7 x 2.8 x 2.9 cm (AP x ML x CC series 6, image 36 and series 9, image 92). The nodule is contiguous with suprahilar airways and pulmonary arteries. A segmental right upper lobe pulmonary artery is attenuated as it traverses the mass and several airways are occluded (series 9, image 75). Contiguous soft tissue extending into the right hilum abuts the carina posteriorly (series 6, image 43). Right lower lobe mass measuring 3.7 x 2.9 x 3.7 cm (series 6, image 70 and series 9, image 91). The mass occludes at the develops segmental bronchi. 12 mm discrete satellite nodule inferiorly and in the right lower lobe (series 6, image 77). Musculoskeletal: No acute fracture identified. Mild multilevel degenerative changes of the spine Review of the MIP images confirms the above findings. CTA ABDOMEN AND PELVIS FINDINGS VASCULAR Aorta: Infrarenal abdominal aortic aneurysm repair measuring up to 3.2 cm. Extensive fibrofatty plaque with plaque ulceration.  Celiac: Patent celiac origin. Diffuse attenuation of the patent splenic and hepatic arteries. SMA: Severe stenosis of SMA approximately 3.7 cm downstream to the origin (series 10, image 83). Renals: Severe atherosclerotic disease of bilateral renal artery is with long segments of severe stenosis/ near occlusion. IMA: Occluded. Inflow: Bilateral aortofemoral bypass are patent. Severe stenosis of proximal femoral arteries bilaterally. Veins: No obvious venous abnormality within the limitations of this arterial phase study. Review of the MIP images confirms the above findings. NON-VASCULAR Hepatobiliary: Hepatic steatosis. Reflux of contrast into the liver may represent heart failure. Severe gallbladder wall thickening. Layering density within the gallbladder may represent stones or vicarious excretion of contrast. Pancreas: Diffuse edema surrounding the pancreas. Suboptimal contrast timing to evaluate for pancreatic necrosis. No pancreatic ductal dilatation. Spleen: Normal in size without focal abnormality. Adrenals/Urinary Tract: No focal kidney lesion. No hydronephrosis. The bladder is collapsed around Foley catheter. Normal adrenal glands. Stomach/Bowel: No obstructive or inflammatory changes of the small and large bowel. Normal stomach. Edema surrounding the duodenum probably  related to pancreatic inflammation or gallbladder inflammation. Lymphatic: No lymphadenopathy identified. Reproductive: Moderate prostate enlargement Other: Small volume of ascites. Musculoskeletal: No acute fracture identified. Mild multilevel degenerative changes of the spine. Review of the MIP images confirms the above findings. IMPRESSION: CTA chest: 1. 2.9 cm right upper lobe cavitary spiculated nodule and 3.7 cm right lower lobe mass. Findings probably represent synchronous or primary/metastatic lung cancer. Distant metastatic disease is less likely. The right upper lobe lesion invades the hilum. There is a satellite nodule inferior to  the right lower lobe lesion. Sub- carinal lymphadenopathy. 2. No aortic dissection. 3. Left common carotid artery origin fibrofatty plaque with moderate 50% stenosis. 4. Cardiomegaly and severe coronary artery calcification. CT abdomen and pelvis: 1. Diffuse gallbladder wall thickening and extensive inflammation surrounding the pancreas. Inflammation is likely due to acute pancreatitis with either reactive changes of the gallbladder or concurrent acute cholecystitis. 2. No aortic dissection. 3. Infrarenal abdominal aortic repair with patent bilateral aortofemoral bypass. Extensive atherosclerotic disease with severe proximal femoral artery stenosis, severe mid SMA stenosis, severe bilateral renal artery stenosis. 4. Layering density within the gallbladder may represent small stones or vicarious excretion of contrast. 5. Small volume of ascites. These results will be called to the ordering clinician or representative by the Radiologist Assistant, and communication documented in the PACS or zVision Dashboard. Electronically Signed   By: Kristine Garbe M.D.   On: 07/14/2017 23:50     STUDIES:  CXR 11/11 > Endotracheal tube 5.7 cm from carina. Enteric tube tip below the field of view in the abdomen. Transcutaneous pacing pads noted. Stable cardiac silhouette. Aortic atherosclerosis. Clear lungs. No acute osseous abnormality is evident ABD 11/11 > Nonobstructive bowel gas pattern. Enteric tube with tip over the stomach and side-port at the region of the gastroesophageal junction. This could be advanced approximately 8 cm. CTA Chest 11/11 > 2.9 cm right upper lobe cavitary spiculated nodule and 3.7 cm right lower lobe mass. Findings probably represent synchronous or primary/metastatic lung cancer. Distant metastatic disease is less likely. The right upper lobe lesion invades the hilum. There is a satellite nodule inferior to the right lower lobe lesion. Sub- carinal lymphadenopathy. 2. No aortic  dissection. 3. Left common carotid artery origin fibrofatty plaque with moderate 50% stenosis. 4. Cardiomegaly and severe coronary artery calcification.  CT A/P 11/11 > 1. Diffuse gallbladder wall thickening and extensive inflammation surrounding the pancreas. Inflammation is likely due to acute pancreatitis with either reactive changes of the gallbladder or concurrent acute cholecystitis. 2. No aortic dissection. 3. Infrarenal abdominal aortic repair with patent bilateral aortofemoral bypass. Extensive atherosclerotic disease with severe proximal femoral artery stenosis, severe mid SMA stenosis, severe bilateral renal artery stenosis. 4. Layering density within the gallbladder may represent small stones or vicarious excretion of contrast. 5. Small volume of ascites.  CULTURES: U/A 11/11 > Negative  Blood Culture 11/11 >> Sputum 11/11 >>   ANTIBIOTICS: Vancomycin 11/11>>>11/12 Zosyn 11/11>>>  SIGNIFICANT EVENTS: 11/11 > Presents to ED   LINES/TUBES: ETT 11/11 >> Right IJ 11/11 >>  Right Femoral 11/11 >>  DISCUSSION: 66 year old male presents to ED hypoxic, hypotensive, and bradycardiac. Intubated in ED. Cardiology consulted.   ASSESSMENT / PLAN:  PULMONARY A: Acute Hypoxic Respiratory Failure  CT Chest with 2.9 cm right upper lobe cavitary spiculated nodule and 3.7 cm right lower lobe mass highly suspicious for malignancy  H/O Tobacco Use  P:   ABG reviewed, increase MV slight Consider SBT, cpap 5 ps5, goal 1  hour Consider bronch bal assessment of mass for dx while on vent pcxr in am   CARDIOVASCULAR A:  Septic vs Cardiogenic Shock, acidosis related likely  CAD s/p RCA stent 2013, AAA repair 2016 H/O HTN, HLD  CT Chest with cardiomegaly and severe coronary artery calcification.  Rule out ischemia P:  Cardiology Following  Off pacer Off epi, off levo , once acidosis was corrected Echo needed Dc vaso to map 65 goal Lactic is celaring Asa add Heparin  drip  RENAL A:   Anion Gap Metabolic Acidosis  ATN likely LA resolving CT A/P with severe renal stenosis  Hyponatremia s/p 3L P:   Dc bicarb drip cvp 13, add 1/2 NS at 52  Chem in am  Follow trend crt  GASTROINTESTINAL A:   Concern for Acute Pancreatitis, unimpressed AST/ALT 86/38 Lipase 42  P:   Feed if not extubated PPI Repeat lipase in am  LFt in am  [place] add to crrleate   HEMATOLOGIC A:   VTE Prop  Rule out ischemia anemia P:  Trend CBC in am  Lower volume in   SCDs  Dc heparin if bronch done   INFECTIOUS A:   Rule out Acute Pancreatitis/Cholecystosis  - unimpressed CT A/P with edema surrounding pancreas and gallbladder wall thickening with layering density within may represent stones   NO PNA noted P:   Lactic cleared Manage off abx for now US pancrease  ENDOCRINE A:   Hyperglycemia    P:   Trend Glucose SSI   Cortisol if BP drops  NEUROLOGIC A:   Acute Metabolic Encephalopathy - worsening DTs" H/O ETOH ETOH 155  CT Head with moderate to severe parenchymal brain volume loss with mild chronic small vessel ischemic disease  P:   RASS goal: 0/-1 Wean Fentanyl gtt to achieve RASS Folic Acid/Thiamine - ensure IV Concern is WD, was brady - no precedex Add benzo scheduled   FAMILY  - Updates: will call wife  - Inter-disciplinary family meet or Palliative Care meeting due by: 07/25/2017   Ccm time 45 min   Lavon Paganini. Titus Mould, MD, [place] Pgr: [facility]

## 2017-07-19 NOTE — Progress Notes (Signed)
CRITICAL VALUE ALERT  Critical Value:  Troponin 30.66  Date & Time Notied:  07/19/2017  2245  Provider Notified: Emily Filbert MD and [facility] cards fellow.  Orders Received/Actions taken: None received. Will continue to monitor closely. Eleonore Chiquito RN 2 heart

## 2017-07-20 ENCOUNTER — Inpatient Hospital Stay (HOSPITAL_COMMUNITY): Payer: Medicare HMO

## 2017-07-20 DIAGNOSIS — J9600 Acute respiratory failure, unspecified whether with hypoxia or hypercapnia: Secondary | ICD-10-CM

## 2017-07-20 LAB — GLUCOSE, CAPILLARY
GLUCOSE-CAPILLARY: 76 mg/dL (ref 65–99)
Glucose-Capillary: 100 mg/dL — ABNORMAL HIGH (ref 65–99)
Glucose-Capillary: 74 mg/dL (ref 65–99)
Glucose-Capillary: 77 mg/dL (ref 65–99)
Glucose-Capillary: 82 mg/dL (ref 65–99)
Glucose-Capillary: 86 mg/dL (ref 65–99)
Glucose-Capillary: 90 mg/dL (ref 65–99)

## 2017-07-20 LAB — COMPREHENSIVE METABOLIC PANEL
ALK PHOS: 168 U/L — AB (ref 38–126)
ALT: 2117 U/L — AB (ref 17–63)
AST: 4288 U/L — AB (ref 15–41)
Albumin: 2.3 g/dL — ABNORMAL LOW (ref 3.5–5.0)
Anion gap: 8 (ref 5–15)
BUN: 21 mg/dL — ABNORMAL HIGH (ref 6–20)
CALCIUM: 7.1 mg/dL — AB (ref 8.9–10.3)
CO2: 22 mmol/L (ref 22–32)
CREATININE: 1.96 mg/dL — AB (ref 0.61–1.24)
Chloride: 100 mmol/L — ABNORMAL LOW (ref 101–111)
GFR, EST AFRICAN AMERICAN: 39 mL/min — AB (ref >60)
GFR, EST NON AFRICAN AMERICAN: 34 mL/min — AB (ref >60)
Glucose, Bld: 108 mg/dL — ABNORMAL HIGH (ref 65–99)
Potassium: 3.7 mmol/L (ref 3.5–5.1)
Sodium: 130 mmol/L — ABNORMAL LOW (ref 135–145)
Total Bilirubin: 1.1 mg/dL (ref 0.3–1.2)
Total Protein: 5 g/dL — ABNORMAL LOW (ref 6.5–8.1)

## 2017-07-20 LAB — PROCALCITONIN: Procalcitonin: 1.46 ng/mL

## 2017-07-20 LAB — CBC WITH DIFFERENTIAL/PLATELET
BASOS ABS: 0 10*3/uL (ref 0.0–0.1)
Basophils Relative: 0 %
Eosinophils Absolute: 0 10*3/uL (ref 0.0–0.7)
Eosinophils Relative: 0 %
HCT: 30.8 % — ABNORMAL LOW (ref 39.0–52.0)
HEMOGLOBIN: 10.3 g/dL — AB (ref 13.0–17.0)
LYMPHS ABS: 0.3 10*3/uL — AB (ref 0.7–4.0)
LYMPHS PCT: 3 %
MCH: 31.6 pg (ref 26.0–34.0)
MCHC: 33.4 g/dL (ref 30.0–36.0)
MCV: 94.5 fL (ref 78.0–100.0)
Monocytes Absolute: 0.6 10*3/uL (ref 0.1–1.0)
Monocytes Relative: 6 %
NEUTROS ABS: 8.5 10*3/uL — AB (ref 1.7–7.7)
Neutrophils Relative %: 91 %
Platelets: 159 10*3/uL (ref 150–400)
RBC: 3.26 MIL/uL — AB (ref 4.22–5.81)
RDW: 13.3 % (ref 11.5–15.5)
WBC: 9.4 10*3/uL (ref 4.0–10.5)

## 2017-07-20 LAB — POCT I-STAT 3, ART BLOOD GAS (G3+)
Acid-base deficit: 1 mmol/L (ref 0.0–2.0)
BICARBONATE: 21.1 mmol/L (ref 20.0–28.0)
O2 SAT: 98 %
PCO2 ART: 27.8 mmHg — AB (ref 32.0–48.0)
PO2 ART: 102 mmHg (ref 83.0–108.0)
Patient temperature: 37.5
TCO2: 22 mmol/L (ref 22–32)
pH, Arterial: 7.489 — ABNORMAL HIGH (ref 7.350–7.450)

## 2017-07-20 LAB — MAGNESIUM
Magnesium: 1.6 mg/dL — ABNORMAL LOW (ref 1.7–2.4)
Magnesium: 1.9 mg/dL (ref 1.7–2.4)

## 2017-07-20 LAB — LIPASE, BLOOD: Lipase: 26 U/L (ref 11–51)

## 2017-07-20 LAB — TROPONIN I
TROPONIN I: 27.43 ng/mL — AB (ref <0.03)
TROPONIN I: 29.86 ng/mL — AB (ref <0.03)
Troponin I: 24.97 ng/mL (ref <0.03)

## 2017-07-20 LAB — PHOSPHORUS
Phosphorus: 3.3 mg/dL (ref 2.5–4.6)
Phosphorus: 3 mg/dL (ref 2.5–4.6)

## 2017-07-20 LAB — CK: CK TOTAL: 698 U/L — AB (ref 49–397)

## 2017-07-20 LAB — LACTATE DEHYDROGENASE: LDH: 1168 U/L — AB (ref 98–192)

## 2017-07-20 LAB — D-DIMER, QUANTITATIVE: D-Dimer, Quant: >20 ug/mL-FEU — ABNORMAL HIGH (ref 0.00–0.50)

## 2017-07-20 LAB — LACTIC ACID, PLASMA: LACTIC ACID, VENOUS: 1.1 mmol/L (ref 0.5–1.9)

## 2017-07-20 LAB — HEPARIN LEVEL (UNFRACTIONATED): Heparin Unfractionated: 0.49 IU/mL (ref 0.30–0.70)

## 2017-07-20 MED ORDER — PANTOPRAZOLE SODIUM 40 MG PO PACK
40.0000 mg | PACK | Freq: Every day | ORAL | Status: DC
  Administered 2017-07-20 – 2017-08-01 (×12): 40 mg
  Filled 2017-07-20 (×12): qty 20

## 2017-07-20 MED ORDER — ATROPINE SULFATE 1 MG/10ML IJ SOSY
1.0000 mg | PREFILLED_SYRINGE | INTRAMUSCULAR | Status: DC | PRN
  Administered 2017-07-20 – 2017-07-22 (×2): 1 mg via INTRAVENOUS
  Filled 2017-07-20 (×4): qty 10

## 2017-07-20 MED ORDER — LACTULOSE 10 GM/15ML PO SOLN
30.0000 g | Freq: Every day | ORAL | Status: DC
  Administered 2017-07-20 – 2017-07-29 (×9): 30 g via ORAL
  Filled 2017-07-20 (×10): qty 45

## 2017-07-20 MED ORDER — VANCOMYCIN HCL IN DEXTROSE 1-5 GM/200ML-% IV SOLN
1000.0000 mg | INTRAVENOUS | Status: DC
  Administered 2017-07-20: 1000 mg via INTRAVENOUS
  Filled 2017-07-20 (×2): qty 200

## 2017-07-20 MED ORDER — PIPERACILLIN-TAZOBACTAM 3.375 G IVPB
3.3750 g | Freq: Three times a day (TID) | INTRAVENOUS | Status: DC
  Administered 2017-07-20 – 2017-07-22 (×6): 3.375 g via INTRAVENOUS
  Filled 2017-07-20 (×7): qty 50

## 2017-07-20 MED ORDER — MAGNESIUM SULFATE 2 GM/50ML IV SOLN
2.0000 g | Freq: Once | INTRAVENOUS | Status: AC
  Administered 2017-07-20: 2 g via INTRAVENOUS
  Filled 2017-07-20: qty 50

## 2017-07-20 MED ORDER — INSULIN ASPART 100 UNIT/ML ~~LOC~~ SOLN: 1.0000 [IU] | SUBCUTANEOUS | Status: DC

## 2017-07-20 NOTE — Progress Notes (Signed)
Pt is restless at this time. Pt wanted pen and paper to write. Pt was getting frustrated when I couldn't understand what he was saying. I asked pt if he would like something to help him relax. Pt nodded his head yes. Pt is in SR at this time. Pt sating 100% on 40% FI02. Will continue to monitor.

## 2017-07-20 NOTE — Progress Notes (Signed)
Progress Note  Patient Name: Adam Ovens Sr. Date of Encounter: 07/20/2017  Primary Cardiologist:   Dr. Jovita Gamma  Subjective   Patient is intubated and sedated this morning.  Has been awake and appropriate during the evening/AM.  Events of possible seizure like activity and bradycardia during the evening noted.  Discussed with nursing and it seems that the neurologic changes happen first.  Tele reviewed as below.   Inpatient Medications    Scheduled Meds: . aspirin  81 mg Oral Daily  . folic acid  1 mg Intravenous Daily  . insulin aspart  2-6 Units Subcutaneous Q4H  . ipratropium-albuterol  3 mL Nebulization Q6H  . LORazepam  1 mg Intravenous Q6H  . mouth rinse  15 mL Mouth Rinse Q2H  . ondansetron (ZOFRAN) IV  4 mg Intravenous Once  . pantoprazole (PROTONIX) IV  40 mg Intravenous QHS  . thiamine injection  100 mg Intravenous Daily  . THROMBI-PAD  1 each Topical Once   Continuous Infusions: . sodium chloride 50 mL/hr at 07/20/17 0600  . sodium chloride 125 mL/hr at 07/20/17 0600  . sodium chloride    . DOPamine 5.039 mcg/kg/min (07/20/17 0600)  . feeding supplement (VITAL AF 1.2 CAL) Stopped (07/20/17 0016)  . fentaNYL infusion INTRAVENOUS 300 mcg/hr (07/20/17 0600)  . heparin 1,050 Units/hr (07/20/17 0600)  . norepinephrine (LEVOPHED) Adult infusion Stopped (07/19/17 2045)  . vasopressin (PITRESSIN) infusion - *FOR SHOCK* Stopped (07/19/17 1830)   PRN Meds: sodium chloride, atropine, fentaNYL, ibuprofen, midazolam   Vital Signs    Vitals:   07/20/17 0630 07/20/17 0645 07/20/17 0700 07/20/17 0737  BP: (!) 151/102  (!) 135/92 122/71  Pulse: (!) 101 94 (!) 102 90  Resp: 18 (!) 21 16 18   Temp: 99.9 F (37.7 C) 99.9 F (37.7 C) 99.7 F (37.6 C)   TempSrc:      SpO2: 97% 98% 91% 98%  Weight:      Height:        Intake/Output Summary (Last 24 hours) at 07/20/2017 0753 Last data filed at 07/20/2017 0700 Gross per 24 hour  Intake 8730.4 ml    Output 748 ml  Net 7982.4 ml   Filed Weights   07/21/2017 1837 07/19/17 0500 07/20/17 0356  Weight: 140 lb (63.5 kg) 156 lb 1.4 oz (70.8 kg) 161 lb 9.6 oz (73.3 kg)    Telemetry    NSR, episodes of sinus brady without asystole or profound pauses.  No atrial flutter noted (artifact), runs of wide complex tachycardia possible VT not sustained - Personally Reviewed  ECG    NSR, no acute ST T wave changes. Rate 89, no acute ST T wave changes  - Personally Reviewed  Physical Exam   GEN: Intubated and sedated. Neck:   Positive JVD Cardiac: RRR, no murmurs, rubs, or gallops.  Respiratory: Clear  to auscultation bilaterally. GI:    Distended.  No bowel sounds.  No obvious tenderness to palpation but the patient is sedated MS: Mild edema; No deformity. Neuro:   Sedated Psych:   [place]    Chemistry Recent Labs  Lab 07/14/2017 1848  07/11/2017 2229 07/19/17 0447 07/20/17 0313  NA 127*   < > 128* 130* 130*  K 4.7   < > 5.3* 3.7 3.7  CL 98*   < > 102 99* 100*  CO2 19*  --  13* 22 22  GLUCOSE 100*   < > 195* 149* 108*  BUN 13   < >  13 16 21*  CREATININE 1.39*   < > 1.46* 1.72* 1.96*  CALCIUM 8.1*  --  6.0* 6.9* 7.1*  PROT 6.3*  --   --   --  5.0*  ALBUMIN 2.8*  --   --   --  2.3*  AST 86*  --   --   --  4,288*  ALT 38  --   --   --  2,117*  ALKPHOS 123  --   --   --  168*  BILITOT 0.7  --   --   --  1.1  GFRNONAA 51*  --  48* 40* 34*  GFRAA 59*  --  56* 46* 39*  ANIONGAP 10  --  13 9 8    < > = values in this interval not displayed.     Hematology Recent Labs  Lab 07/22/2017 1848 07/24/2017 1854 07/19/17 0447 07/20/17 0313  WBC 7.6  --  8.6 9.4  RBC 3.77*  --  3.52* 3.26*  HGB 11.9* 14.3 10.9* 10.3*  HCT 36.5* 42.0 33.3* 30.8*  MCV 96.8  --  94.6 94.5  MCH 31.6  --  31.0 31.6  MCHC 32.6  --  32.7 33.4  RDW 13.1  --  12.8 13.3  PLT 246  --  128* 159    Cardiac Enzymes Recent Labs  Lab 07/19/17 0447 07/19/17 2145 07/20/17 0313  TROPONINI 9.14* 30.66*  27.43*    Recent Labs  Lab 07/26/2017 1906 07/12/2017 2121  TROPIPOC 0.25* 0.29*     BNPNo results for input(s): BNP, PROBNP in the last 168 hours.   DDimer  Recent Labs  Lab 07/20/17 0313  DDIMER >20.00*     Radiology    Dg Abdomen 1 View  Result Date: 07/10/2017 CLINICAL DATA:  Orogastric tube. EXAM: ABDOMEN - 1 VIEW COMPARISON:  05/30/2015 FINDINGS: Exam demonstrates patient's enteric tube which has been pulled back and has tip over the stomach in the left upper quadrant and side port at the level of the gastroesophageal junction. Bowel gas pattern is nonobstructive. Remainder of the exam is unchanged. IMPRESSION: Nonobstructive bowel gas pattern. Enteric tube with tip over the stomach and side-port at the region of the gastroesophageal junction. This could be advanced approximately 8 cm. Electronically Signed   By: Marin Olp M.D.   On: 07/27/2017 20:32   Ct Head Wo Contrast  Result Date: 08/03/2017 CLINICAL DATA:  Found face down for unknown period of time. History of hypertension. EXAM: CT HEAD WITHOUT CONTRAST TECHNIQUE: Contiguous axial images were obtained from the base of the skull through the vertex without intravenous contrast. COMPARISON:  None. FINDINGS: BRAIN: No intraparenchymal hemorrhage, mass effect nor midline shift. Moderate to severe parenchymal brain volume loss. No hydrocephalus. Patchy supratentorial white matter hypodensities within normal range for patient's age, though non-specific are most compatible with chronic small vessel ischemic disease. No acute large vascular territory infarcts. No abnormal extra-axial fluid collections. Basal cisterns are patent. VASCULAR: Severe calcific atherosclerosis of the carotid siphons. SKULL: No skull fracture. No significant scalp soft tissue swelling. SINUSES/ORBITS: The mastoid air-cells and included paranasal sinuses are well-aerated.The included ocular globes and orbital contents are non-suspicious. Symmetrically enlarged  superior ophthalmic veins associated with positive end pressure ventilation. OTHER: Life-support lines in place. IMPRESSION: 1. No acute intracranial process. 2. Moderate to severe parenchymal brain volume loss, advanced for age. 3. Mild chronic small vessel ischemic disease. 4. Severe atherosclerosis. Electronically Signed   By: Elon Alas M.D.   On: 07/12/2017  23:22   US Abdomen Complete  Result Date: 07/19/2017 CLINICAL DATA:  Pancreatitis EXAM: ABDOMEN ULTRASOUND COMPLETE COMPARISON:  Abdominal CT from yesterday FINDINGS: Gallbladder: Edematous and striated thickened wall measuring 11 mm. Layering calculi that are small. Patient is ventilated. Common bile duct: Diameter: 6 mm. Where visualized, no filling defect. Liver: 2 right and 1 left hypoechoic liver mass, likely metastatic disease given chest findings from yesterday. The masses are lobulated and measure up to 3.2 cm in the right liver. Portal vein is patent on color Doppler imaging with normal direction of blood flow towards the liver. IVC: No abnormality visualized. Pancreas: Visualized portion unremarkable. Spleen: Size and appearance within normal limits. Right Kidney: Length: 10 cm. Echogenicity within normal limits. No mass or hydronephrosis visualized. Left Kidney: Length: 9 cm. Echogenicity within normal limits. No mass or hydronephrosis visualized. Abdominal aorta: Atherosclerotic plaque. Other findings: Small volume ascites. IMPRESSION: 1. Three liver masses measuring up to 3.2 cm, favor metastatic disease given chest CT findings yesterday. 2. Cholelithiasis. No visible choledocholithiasis or common bile duct dilatation. 3. Prominent gallbladder wall edema. This could be reactive rather than cholecystitis given the gallbladder is not over dilated. Patient is intubated, limiting Murphy sign. Electronically Signed   By: Monte Fantasia M.D.   On: 07/19/2017 11:26   Dg Chest Port 1 View  Result Date: 07/19/2017 CLINICAL DATA:   Respiratory failure EXAM: PORTABLE CHEST 1 VIEW COMPARISON:  Yesterday FINDINGS: Endotracheal tube, NG tube, right jugular central venous catheter are stable. Right infrahilar mass is stable. Right suprahilar opacity is stable. Left lung is clear. IMPRESSION: Stable examination. Right infrahilar mass. Right suprahilar opacity. Electronically Signed   By: Marybelle Killings M.D.   On: 07/19/2017 07:29   Dg Chest Portable 1 View  Result Date: 07/20/2017 CLINICAL DATA:  Central line placement. EXAM: PORTABLE CHEST 1 VIEW COMPARISON:  Chest radiograph July 18, 2017 at 1912 hours FINDINGS: RIGHT internal jugular central venous catheter tip projects in distal superior vena cava. Endotracheal tube tip projects 5.3 cm above the carina. Nasogastric tube past GE junction, distal tip out of field-of-view. The cardiac silhouette is mildly enlarged. Calcified aortic knob. No pleural effusion or focal consolidation. No pneumothorax. Soft tissue planes and included osseous structures are nonsuspicious. IMPRESSION: RIGHT internal jugular central venous catheter distal tip projects in distal superior vena cava. No pneumothorax. No apparent change of remaining life support lines. Mild cardiomegaly.  No acute pulmonary process. Aortic Atherosclerosis (ICD10-I70.0). Electronically Signed   By: Elon Alas M.D.   On: 07/20/2017 23:58   Dg Chest Portable 1 View  Result Date: 07/20/2017 CLINICAL DATA:  66 y/o  M; endotracheal tube placement. EXAM: PORTABLE CHEST 1 VIEW COMPARISON:  06/05/2015 chest radiograph FINDINGS: Endotracheal tube 5.7 cm from carina. Enteric tube tip below the field of view in the abdomen. Transcutaneous pacing pads noted. Stable cardiac silhouette. Aortic atherosclerosis. Clear lungs. No acute osseous abnormality is evident. IMPRESSION: Endotracheal tube 5.7 cm from carina. Enteric tube tip below field of view and abdomen. Stable cardiac silhouette. Clear lungs. Electronically Signed   By: Kristine Garbe M.D.   On: 07/17/2017 20:19   Ct Angio Chest/abd/pel For Dissection W And/or Wo Contrast  Result Date: 08/06/2017 CLINICAL DATA:  66 y/o  M; found face down with central chest pain. EXAM: CT ANGIOGRAPHY CHEST, ABDOMEN AND PELVIS TECHNIQUE: Multidetector CT imaging through the chest, abdomen and pelvis was performed using the standard protocol during bolus administration of intravenous contrast. Multiplanar reconstructed images and MIPs were  obtained and reviewed to evaluate the vascular anatomy. CONTRAST:  166mL ISOVUE-370 IOPAMIDOL (ISOVUE-370) INJECTION 76% COMPARISON:  05/02/2015 CT of the abdomen and pelvis. FINDINGS: CTA CHEST FINDINGS Cardiovascular: Preferential opacification of the thoracic aorta. No evidence of thoracic aortic aneurysm or dissection. Moderate calcific atherosclerosis. Mixed plaque of left common carotid artery origin with moderate 50% stenosis. Mild cardiomegaly. Severe coronary artery calcification. Mediastinum/Nodes: Sub- carinal lymph node measuring 16 x 26 mm (series 6, image 53). Otherwise no mediastinal adenopathy. Enteric tube tip in the gastric body. Patulous esophagus. Normal thyroid gland. Lungs/Pleura: Right upper lobe cavitary nodule measuring 2.7 x 2.8 x 2.9 cm (AP x ML x CC series 6, image 36 and series 9, image 92). The nodule is contiguous with suprahilar airways and pulmonary arteries. A segmental right upper lobe pulmonary artery is attenuated as it traverses the mass and several airways are occluded (series 9, image 75). Contiguous soft tissue extending into the right hilum abuts the carina posteriorly (series 6, image 43). Right lower lobe mass measuring 3.7 x 2.9 x 3.7 cm (series 6, image 70 and series 9, image 91). The mass occludes at the develops segmental bronchi. 12 mm discrete satellite nodule inferiorly and in the right lower lobe (series 6, image 77). Musculoskeletal: No acute fracture identified. Mild multilevel degenerative changes of  the spine Review of the MIP images confirms the above findings. CTA ABDOMEN AND PELVIS FINDINGS VASCULAR Aorta: Infrarenal abdominal aortic aneurysm repair measuring up to 3.2 cm. Extensive fibrofatty plaque with plaque ulceration. Celiac: Patent celiac origin. Diffuse attenuation of the patent splenic and hepatic arteries. SMA: Severe stenosis of SMA approximately 3.7 cm downstream to the origin (series 10, image 83). Renals: Severe atherosclerotic disease of bilateral renal artery is with long segments of severe stenosis/ near occlusion. IMA: Occluded. Inflow: Bilateral aortofemoral bypass are patent. Severe stenosis of proximal femoral arteries bilaterally. Veins: No obvious venous abnormality within the limitations of this arterial phase study. Review of the MIP images confirms the above findings. NON-VASCULAR Hepatobiliary: Hepatic steatosis. Reflux of contrast into the liver may represent heart failure. Severe gallbladder wall thickening. Layering density within the gallbladder may represent stones or vicarious excretion of contrast. Pancreas: Diffuse edema surrounding the pancreas. Suboptimal contrast timing to evaluate for pancreatic necrosis. No pancreatic ductal dilatation. Spleen: Normal in size without focal abnormality. Adrenals/Urinary Tract: No focal kidney lesion. No hydronephrosis. The bladder is collapsed around Foley catheter. Normal adrenal glands. Stomach/Bowel: No obstructive or inflammatory changes of the small and large bowel. Normal stomach. Edema surrounding the duodenum probably related to pancreatic inflammation or gallbladder inflammation. Lymphatic: No lymphadenopathy identified. Reproductive: Moderate prostate enlargement Other: Small volume of ascites. Musculoskeletal: No acute fracture identified. Mild multilevel degenerative changes of the spine. Review of the MIP images confirms the above findings. IMPRESSION: CTA chest: 1. 2.9 cm right upper lobe cavitary spiculated nodule and 3.7  cm right lower lobe mass. Findings probably represent synchronous or primary/metastatic lung cancer. Distant metastatic disease is less likely. The right upper lobe lesion invades the hilum. There is a satellite nodule inferior to the right lower lobe lesion. Sub- carinal lymphadenopathy. 2. No aortic dissection. 3. Left common carotid artery origin fibrofatty plaque with moderate 50% stenosis. 4. Cardiomegaly and severe coronary artery calcification. CT abdomen and pelvis: 1. Diffuse gallbladder wall thickening and extensive inflammation surrounding the pancreas. Inflammation is likely due to acute pancreatitis with either reactive changes of the gallbladder or concurrent acute cholecystitis. 2. No aortic dissection. 3. Infrarenal abdominal aortic repair with  patent bilateral aortofemoral bypass. Extensive atherosclerotic disease with severe proximal femoral artery stenosis, severe mid SMA stenosis, severe bilateral renal artery stenosis. 4. Layering density within the gallbladder may represent small stones or vicarious excretion of contrast. 5. Small volume of ascites. These results will be called to the ordering clinician or representative by the Radiologist Assistant, and communication documented in the PACS or zVision Dashboard. Electronically Signed   By: Kristine Garbe M.D.   On: 08/04/2017 23:50    Cardiac Studies   ECHO:   Study Conclusions  - Left ventricle: The cavity size was normal. There was moderate   concentric hypertrophy. Systolic function was normal. The   estimated ejection fraction was in the range of 60% to 65%. Wall   motion was normal; there were no regional wall motion   abnormalities. - Mitral valve: Calcified annulus. - Right ventricle: Systolic function was moderately reduced. - Tricuspid valve: There was mild regurgitation. - Pericardium, extracardiac: The pericardium appears thickened at   the apex as well as the apical RV wall with small pericardial    effusion over the apex. Suggest cardiac MRI to evaluate   pericardium further.   Patient Profile     66 y.o. male with PMH CAD s/p RCA stent 2013, AAA s/p open repair 2016 with aortobifemoral bypass grafts, HTN, dyslipidemia seen in consult at the request of Dr. Tomi Bamberger for bradycardia, hypotension, and syncope.  Assessment & Plan    NSTEMI:  Presevered EF and trop trending down.  No evidence of cardiogenic shock.  On minimal pressors now.  Given other comorbid conditions including a GI process and the absence of acute ST elevation or new wall motion abnormality I would not suggest cardiac cath at this time but will continue to assess.   Continue heparin and ASA.   BRADYCARDIA:  I reviewed telemetry and do not see a rhythm that should have precipitated seizures.  There is some mention of falling HR when he is moved in the bed.  I would suspect that the fall in HR is secondary to the acute neuro events (vagal).  Continue to monitor.    AKI:  Creat up mildly .  Continue to follow.    LUNG MASS:  Cavitary.  Likely newly diagnosed lung cancer with mets to liver.   ELEVATED LIVER ENZYMES:   As above.   ABNORMAL PERICARDIUM:  See report above.  This could represent malignancy given the lung and liver lesions.  MRI would be needed to further image this but not indicated at this time.  Effusion is not hemodynamically significant.    For questions or updates, please contact [place] Please consult [email or web address] for contact info under Cardiology/STEMI.   Signed, Minus Breeding, MD  07/20/2017, 7:53 AM

## 2017-07-20 NOTE — Progress Notes (Signed)
One hour after sedation cut in half, patient is responding startling at times, with eyesrolled upwards. RT called for weaning per protocol wake assessment as noted. Fentanyl turned off.

## 2017-07-20 NOTE — Progress Notes (Signed)
Circle for Heparin Indication: chest pain/ACS  Allergies  Allergen Reactions  . Chlorhexidine Itching and Rash  . Codeine Itching and Rash    Patient Measurements: Height: 5\' 10"  (177.8 cm) Weight: 161 lb 9.6 oz (73.3 kg) IBW/kg (Calculated) : 73  Vital Signs: Temp: 100.2 F (37.9 C) (11/13 0900) BP: 132/90 (11/13 0900) Pulse Rate: 89 (11/13 0900)  Labs: Recent Labs    07/21/2017 1848 07/20/2017 1854 07/28/2017 1855 07/08/2017 2229  07/19/17 0447 07/19/17 1329 07/19/17 1950 07/19/17 2145 07/20/17 0313 07/20/17 0900  HGB 11.9* 14.3  --   --   --  10.9*  --   --   --  10.3*  --   HCT 36.5* 42.0  --   --   --  33.3*  --   --   --  30.8*  --   PLT 246  --   --   --   --  128*  --   --   --  159  --   APTT  --   --   --  52*  --   --   --   --   --   --   --   LABPROT  --   --  14.6  --   --   --   --   --   --   --   --   INR  --   --  1.15  --   --   --   --   --   --   --   --   HEPARINUNFRC  --   --   --   --   --   --  0.14* 0.44  --  0.49  --   CREATININE 1.39* 1.40*  --  1.46*  --  1.72*  --   --   --  1.96*  --   TROPONINI  --   --   --   --    < > 9.14*  --   --  30.66* 27.43* 29.86*   < > = values in this interval not displayed.    Estimated Creatinine Clearance: 38.3 mL/min (A) (by C-G formula based on SCr of 1.96 mg/dL (H)).  Assessment: 66 y.o. male with VDRF/cardiogenic shock, elevated cardiac markers, for heparin.  Heparin at goal this morning after rate adjustments yesterday. No bleeding noted, hgb stable 10s, plt count stable. Note d-dimer is elevated at >20. No immediate plans for cath will continue to follow.  Goal of Therapy:  Heparin level 0.3-0.7 units/ml Monitor platelets by anticoagulation protocol: Yes   Plan:  Continue Heparin at current rate  Erin Hearing PharmD., BCPS Clinical Pharmacist Pager [phone] 07/20/2017 10:37 AM

## 2017-07-20 NOTE — H&P (Addendum)
PULMONARY / CRITICAL CARE MEDICINE   Name: Adam Krupka Sr. MRN: [id number] DOB: [birth year]    ADMISSION DATE:  07/13/2017 CONSULTATION DATE:  07/09/2017  REFERRING MD:  Dr. Tomi Bamberger  CHIEF COMPLAINT:  Chest Pain   HISTORY OF PRESENT ILLNESS:   66 year old male with PMH of CAD s/p RCA stent 2013, AAA s/p open repair in 2016 with aortiobifemoral bypass graft, HTN, HLD, ETOH  Presents to ED on 11/11 after wife found patient face down for unknown time. When EMS arrived patient complaining of central chest pain/syncope. Family states he took an unknown amount of nitro tablets. EMS gave 9 mcg EPI and 1 mg Atropine for Bradycardia and Hypotension. Upon arrival to ED patient is diaphoretic and non-rebreather. Intubated in ED. ABG 7.353/25.5/282. ETOH 155. BP Systolic 01-02, on external pacer, underlying HR 15. Cardiology consulted. PCCM asked to admit.   Upon arrival to ED patient is on 20 Levophed with Systolic 40 and currently on external pacer. When looking at [facility] bottle, patient only missing 4 tablets (family states they are sure he took one on Halloween)  Family reports for the last few months patient has been complaining of progressive dyspnea, over the last couple of weeks intermittent chest pain. Per family patient drinks heavily.      SUBJECTIVE:  Episodes of vagal response Agitation Dopamine at 5 Levo off  VITAL SIGNS: BP 132/90   Pulse 89   Temp 100.2 F (37.9 C)   Resp 12   Ht _0  (1.778 m)   Wt 73.3 kg (161 lb 9.6 oz)   SpO2 98%   BMI 23.19 kg/m   HEMODYNAMICS: CVP:  [2 mmHg-21 mmHg] 14 mmHg  VENTILATOR SETTINGS: Vent Mode: PRVC FiO2 (%):  [40 %] 40 % Set Rate:  [14 bmp-18 bmp] 18 bmp Vt Set:  [580 mL] 580 mL PEEP:  [5 cmH20] 5 cmH20 Plateau Pressure:  [20 cmH20-25 cmH20] 22 cmH20  INTAKE / OUTPUT: I/O last 3 completed shifts: In: 13042.4 [I.V.:9057; NG/GT:685.4; IV Piggyback:3300] Out: 7253 [GUYQI:3474]  PHYSICAL EXAMINATION: General: awake,  fc Neuro: fc, some agitationmovements HEENT: jvd PULM: reduced CV: s1 s2 RRR 80 GI: soft, distention, bs low Extremities: edema,mild echymosis     LABS:  BMET Recent Labs  Lab 07/09/2017 2229 07/19/17 0447 07/20/17 0313  NA 128* 130* 130*  K 5.3* 3.7 3.7  CL 102 99* 100*  CO2 13* 22 22  BUN 13 16 21*  CREATININE 1.46* 1.72* 1.96*  GLUCOSE 195* 149* 108*    Electrolytes Recent Labs  Lab 07/21/2017 2229 07/19/17 0447 07/19/17 1051 07/19/17 1950 07/20/17 0313  CALCIUM 6.0* 6.9*  --   --  7.1*  MG 1.3* 1.8 1.6* 1.7 1.6*  PHOS 5.7* 2.1* 4.0 4.3 3.3    CBC Recent Labs  Lab 08/03/2017 1848 07/24/2017 1854 07/19/17 0447 07/20/17 0313  WBC 7.6  --  8.6 9.4  HGB 11.9* 14.3 10.9* 10.3*  HCT 36.5* 42.0 33.3* 30.8*  PLT 246  --  128* 159    Coag's Recent Labs  Lab 07/21/2017 1855 08/05/2017 2229  APTT  --  52*  INR 1.15  --     Sepsis Markers Recent Labs  Lab 07/13/2017 2123 08/05/2017 2229 07/29/2017 2303 07/19/17 0447 07/20/17 0313  LATICACIDVEN 8.13*  --  7.1* 3.4*  --   PROCALCITON  --  <0.10  --  1.10 1.46    ABG Recent Labs  Lab 07/19/17 0130 07/19/17 1010 07/20/17 0159  PHART 7.306* 7.411  7.489*  PCO2ART 37.8 36.8 27.8*  PO2ART 90.8 148.0* 102.0    Liver Enzymes Recent Labs  Lab 08/01/2017 1848 07/20/17 0313  AST 86* 4,288*  ALT 38 2,117*  ALKPHOS 123 168*  BILITOT 0.7 1.1  ALBUMIN 2.8* 2.3*    Cardiac Enzymes Recent Labs  Lab 07/19/17 2145 07/20/17 0313 07/20/17 0900  TROPONINI 30.66* 27.43* 29.86*    Glucose Recent Labs  Lab 07/19/17 0911 07/19/17 1155 07/19/17 1954 07/20/17 0013 07/20/17 0347 07/20/17 0839  GLUCAP 129* 110* 75 86 100* 76    Imaging US Abdomen Complete  Result Date: 07/19/2017 CLINICAL DATA:  Pancreatitis EXAM: ABDOMEN ULTRASOUND COMPLETE COMPARISON:  Abdominal CT from yesterday FINDINGS: Gallbladder: Edematous and striated thickened wall measuring 11 mm. Layering calculi that are small. Patient is  ventilated. Common bile duct: Diameter: 6 mm. Where visualized, no filling defect. Liver: 2 right and 1 left hypoechoic liver mass, likely metastatic disease given chest findings from yesterday. The masses are lobulated and measure up to 3.2 cm in the right liver. Portal vein is patent on color Doppler imaging with normal direction of blood flow towards the liver. IVC: No abnormality visualized. Pancreas: Visualized portion unremarkable. Spleen: Size and appearance within normal limits. Right Kidney: Length: 10 cm. Echogenicity within normal limits. No mass or hydronephrosis visualized. Left Kidney: Length: 9 cm. Echogenicity within normal limits. No mass or hydronephrosis visualized. Abdominal aorta: Atherosclerotic plaque. Other findings: Small volume ascites. IMPRESSION: 1. Three liver masses measuring up to 3.2 cm, favor metastatic disease given chest CT findings yesterday. 2. Cholelithiasis. No visible choledocholithiasis or common bile duct dilatation. 3. Prominent gallbladder wall edema. This could be reactive rather than cholecystitis given the gallbladder is not over dilated. Patient is intubated, limiting Murphy sign. Electronically Signed   By: Monte Fantasia M.D.   On: 07/19/2017 11:26   Dg Chest Port 1 View  Result Date: 07/20/2017 CLINICAL DATA:  Check endotracheal tube placement EXAM: PORTABLE CHEST 1 VIEW COMPARISON:  07/19/2017 FINDINGS: Cardiac shadow is stable. Aortic calcifications are again seen. Endotracheal tube, nasogastric catheter and right jugular central line are again noted and stable. The lungs are well aerated bilaterally. The changes in the right lung in the suprahilar and infrahilar region seen on previous CT are less conspicuous on today's exam due to some patient rotation. Posteriorly layering effusion on the right is noted. No bony abnormality is seen. IMPRESSION: Slight increase in the degree of right-sided pleural effusion when compared with prior exam. The known  suprahilar and infrahilar changes on the right are less conspicuous on today's exam due to patient rotation Electronically Signed   By: Inez Catalina M.D.   On: 07/20/2017 07:53     STUDIES:  CXR 11/11 > Endotracheal tube 5.7 cm from carina. Enteric tube tip below the field of view in the abdomen. Transcutaneous pacing pads noted. Stable cardiac silhouette. Aortic atherosclerosis. Clear lungs. No acute osseous abnormality is evident ABD 11/11 > Nonobstructive bowel gas pattern. Enteric tube with tip over the stomach and side-port at the region of the gastroesophageal junction. This could be advanced approximately 8 cm. CTA Chest 11/11 > 2.9 cm right upper lobe cavitary spiculated nodule and 3.7 cm right lower lobe mass. Findings probably represent synchronous or primary/metastatic lung cancer. Distant metastatic disease is less likely. The right upper lobe lesion invades the hilum. There is a satellite nodule inferior to the right lower lobe lesion. Sub- carinal lymphadenopathy. 2. No aortic dissection. 3. Left common carotid artery origin fibrofatty plaque  with moderate 50% stenosis. 4. Cardiomegaly and severe coronary artery calcification.  CT A/P 11/11 > 1. Diffuse gallbladder wall thickening and extensive inflammation surrounding the pancreas. Inflammation is likely due to acute pancreatitis with either reactive changes of the gallbladder or concurrent acute cholecystitis. 2. No aortic dissection. 3. Infrarenal abdominal aortic repair with patent bilateral aortofemoral bypass. Extensive atherosclerotic disease with severe proximal femoral artery stenosis, severe mid SMA stenosis, severe bilateral renal artery stenosis. 4. Layering density within the gallbladder may represent small stones or vicarious excretion of contrast. 5. Small volume of ascites.  [place] 11/13>>>Three liver masses measuring up to 3.2 cm, favor metastatic disease given chest CT findings yesterday. 2. Cholelithiasis. No  visible choledocholithiasis or common bile duct dilatation. 3. Prominent gallbladder wall edema. This could be reactive rather than cholecystitis given the gallbladder is not over dilated  CULTURES: U/A 11/11 > Negative  Blood Culture 11/11 >> Sputum 11/11 >>  ANTIBIOTICS: Vancomycin 11/11>>>11/12 Zosyn 11/11>>>  SIGNIFICANT EVENTS: 11/11 > Presents to ED  1/12- shock, brady, vagal  LINES/TUBES: ETT 11/11 >> Right IJ 11/11 >>  Right Femoral 11/11 >>  DISCUSSION: 66 year old male presents to ED hypoxic, hypotensive, and bradycardiac. Intubated in ED. Cardiology consulted.   ASSESSMENT / PLAN:  PULMONARY A: Acute Hypoxic Respiratory Failure  CT Chest with 2.9 cm right upper lobe cavitary spiculated nodule and 3.7 cm right lower lobe mass highly suspicious for malignancy  With mets H/O Tobacco Use  P:   ABG reviewed reduce MV slight Reluctant to bronch until brady vagal improved PS weaning attempted Consider Tv down  CARDIOVASCULAR A:  Septic vs Cardiogenic Shock, acidosis related likely  CAD s/p RCA stent 2013, AAA repair 2016 H/O HTN, HLD  CT Chest with cardiomegaly and severe coronary artery calcification.  Rule out ischemia, cath at some point per cards Trop elevated P:  dopamine to MAP 65 and without symptomatic brady  Asa Heparin drip Dc a line  RENAL A:   Anion Gap Metabolic Acidosis  ATN likely P:   cvp up, got 8 liters kvo Chem in am   GASTROINTESTINAL A:   Transaminase elevation At risk ischemic bowel Met liver , primary lung? No lose stools or blood [place] neg gallbaldder P:   I reviewed [place] May need repeat CT abdo, assess ldh, lactic, cpk ensure hep panel been done Ensure lft in am  Assess aFP for risk primary heptocellular  HEMATOLOGIC A:   VTE Prop  Rule out ischemia anemia P:  Trend CBC in am  Lower volume in   SCDs  Dc heparin if bronch done   INFECTIOUS A:   NO PNA Assess risk abdo source, ischemia  P:   May need  repeat CT Add ABX zosyn vanc  ENDOCRINE A:   Hyperglycemia   Not present, low normal P:   Trend Glucose SSI  dc Cortisol 100, no role stress roids  NEUROLOGIC A:   Acute Metabolic Encephalopathy - worsening DTs" H/O ETOH ETOH 155  CT Head with moderate to severe parenchymal brain volume loss with mild chronic small vessel ischemic disease  High risk hepatic encpeh P:   RASS goal: 0/-1 If needed add back fent Folic Acid/Thiamine - ensure IV Add lactulose  FAMILY  - Updates:   - Inter-disciplinary family meet or Palliative Care meeting due by: 07/25/2017   Ccm time 35 min   Lavon Paganini. Titus Mould, MD, [place] Pgr: [facility]

## 2017-07-20 NOTE — Progress Notes (Signed)
PIV left AC out, bleeding pressure held for 20 minutes, still oozing, pressure dressing applied. Patient had episode of bradycardia into 50's BP decreased to 60 systolic. At this time, patient not opening eyes or responsive to any stimulation, prior he was opening eyes to command. . Did respond after heart rate increased. Bradycardia lasted < less 2 minutes. HR increased up to 90's with corresponding increase in BP with heart rate increase.

## 2017-07-20 NOTE — Progress Notes (Signed)
Right femoral aline d/c pressure held x 10 minutes, hemostasis achieved. Pressure dressing applied to site. Rechecked , no bleeding at sight.

## 2017-07-20 NOTE — Progress Notes (Signed)
D/W  ICU  Nurse, Reviewed  E link ICU notes,  Called in By ICU nurse to evaluate  Patients  Symptoms, critical lab  Findings    Currently  Patient is  Intubated On Fentanyl, hep gtt, off and on Levo  Drip , Vitals  Stable,  Rhythm strips  And ekg , show  possible  Atrial fibrillation ,   Reviewed  Critical lab  Findings  Of  Trop up to 30.0 ,  And  Seizure like  Activity notes   ICU  Staff, and  Discussed  With on  Call interventional cardiology Dr Ellyn Hack  For  [facility]  Heart  Care,  We will continu  Hep gtt, pt  Is  Intubated  And  Will need  Neuro eval,  We  Also  Recommended  PE  Work up , D- Dimer   Vitals  150/70 mm hg , HR  70 bpm   Will review  Case  With AM cardiology  Team   Johna Sheriff MD  [place] on call  [place] care   For questions or updates, please contact [place] Please consult [email or web address] contactinfo under Cardiology/STEMI

## 2017-07-20 NOTE — Progress Notes (Signed)
Another episode of seizure like activity followed by an extreme drop in HR and BP. [place] Md notified. Will continue to monitor closely. Eleonore Chiquito RN 2 Heart

## 2017-07-20 NOTE — Progress Notes (Signed)
Pharmacy Antibiotic Note  Adam Lopez. is a 66 y.o. male admitted on 07/24/2017 with sepsis.    Pharmacy has been reconsulted for zosyn and vancomycin dosing given worsened condition, abdomen is believed as source. Tmax 100.8 over past 24 hours. -WBC= 9, SCr= 1.4 and CrCl ~ 40   Plan: -Zosyn 3.375gm IV q8h -Vancomycin 1000mg  IV q24 hours -Will follow renal function, cultures and clinical progress    Height: 5\' 10"  (177.8 cm) Weight: 161 lb 9.6 oz (73.3 kg) IBW/kg (Calculated) : 73  Temp (24hrs), Avg:98.9 F (37.2 C), Min:97.7 F (36.5 C), Max:100.4 F (38 C)  Recent Labs  Lab 08/06/2017 1848 07/26/2017 1854 07/30/2017 1855 08/01/2017 2123 08/01/2017 2229 08/06/2017 2303 07/19/17 0447 07/20/17 0313  WBC 7.6  --   --   --   --   --  8.6 9.4  CREATININE 1.39* 1.40*  --   --  1.46*  --  1.72* 1.96*  LATICACIDVEN  --   --  4.07* 8.13*  --  7.1* 3.4*  --     Estimated Creatinine Clearance: 38.3 mL/min (A) (by C-G formula based on SCr of 1.96 mg/dL (H)).    Allergies  Allergen Reactions  . Chlorhexidine Itching and Rash  . Codeine Itching and Rash    Antimicrobials this admission: 11/13 vanc 11/13 zosyn  Thank you for allowing pharmacy to be a part of this patient's care.  Erin Hearing PharmD., BCPS Clinical Pharmacist Pager [phone] 07/20/2017 2:25 PM

## 2017-07-20 NOTE — Progress Notes (Signed)
Bradycardia with severe hypotension. Transcutaneous pacing preformed briefly with no resolution in hypotension. Elink MD and cards made aware. Symptoms resolved ~76mins. Will continue to monitor closely. Eleonore Chiquito RN 2 Heart

## 2017-07-20 NOTE — Progress Notes (Signed)
Tender to palpation of right abdomen, bowel sounds very few. Abdomen distended, hooked NG to LIS. Undigested tube feeding return, 200 ml. Followed by yellow drainage. Kept on LIS. Halved fentanyl for wean protocol.

## 2017-07-20 NOTE — Progress Notes (Signed)
[place] for Heparin Indication: chest pain/ACS  Allergies  Allergen Reactions  . Chlorhexidine Itching and Rash  . Codeine Itching and Rash    Patient Measurements: Height: 5\' 10"  (177.8 cm) Weight: 156 lb 1.4 oz (70.8 kg) IBW/kg (Calculated) : 73  Vital Signs: Temp: 98.8 F (37.1 C) (11/13 0000) BP: 141/87 (11/13 0002) Pulse Rate: 86 (11/13 0002)  Labs: Recent Labs    07/13/2017 1848 07/26/2017 1854 07/22/2017 1855 07/14/2017 2229 07/19/17 0447 07/19/17 1329 07/19/17 1950 07/19/17 2145  HGB 11.9* 14.3  --   --  10.9*  --   --   --   HCT 36.5* 42.0  --   --  33.3*  --   --   --   PLT 246  --   --   --  128*  --   --   --   APTT  --   --   --  52*  --   --   --   --   LABPROT  --   --  14.6  --   --   --   --   --   INR  --   --  1.15  --   --   --   --   --   HEPARINUNFRC  --   --   --   --   --  0.14* 0.44  --   CREATININE 1.39* 1.40*  --  1.46* 1.72*  --   --   --   TROPONINI  --   --   --   --  9.14*  --   --  30.66*    Estimated Creatinine Clearance: 42.3 mL/min (A) (by C-G formula based on SCr of 1.72 mg/dL (H)).  Assessment: 66 y.o. male with VDRF/cardiogenic shock, elevated cardiac markers, for heparin.  Goal of Therapy:  Heparin level 0.3-0.7 units/ml Monitor platelets by anticoagulation protocol: Yes   Plan:  Continue Heparin at current rate  Phillis Knack, PharmD, BCPS 07/20/2017 12:19 AM

## 2017-07-20 NOTE — Progress Notes (Signed)
Patient had another event of bradycardia, with BP drop into sbp50's within 15 of cuff pressure. Levophed turned back on dopamine increased to 10Mcg/Kg/Min. Dr. Levonne Spiller paed regarding episodes. BP cmae back up with heart rate, levophed at 86mcg, Dopamine staying at 52mcg. SBP currently 168

## 2017-07-20 NOTE — Progress Notes (Signed)
Discussed with MD, Dr. Raynelle Fanning,  of 2  Instances of brachycardia and treatments performed. Patient reviewed by MD.

## 2017-07-20 NOTE — Progress Notes (Addendum)
eLink Physician-Brief Progress Note Patient Name: Nasire Reali Sr. DOB: [birth year] MRN: [id number]   Date of Service  07/20/2017  HPI/Events of Note  Called several times tonight about episodes of hypotension and bradycardia followed by brief episodes of what the bedside nurse describes as "seizure like" activity of very brief duration. I have never witnessed it personally even though I have assessed the patient via camera several times. I suspect that this is related to cardiac ischemia --> compromised LV function --> hypotension and bradycardia --> "seizure like activity". His last Troponin = 30.66. Therefore, the ischemic connection is a valid explanation of events. Cardiology is aware of his Troponin and don't want to take him to Cardiac Cath Lab at this time.   eICU Interventions  Plan: 1. Continue present management.  2. Will advise PCCM ground team of events.      Intervention Category Major Interventions: Hypotension - evaluation and management  Sommer,Steven Eugene 07/20/2017, 1:36 AM

## 2017-07-20 NOTE — Progress Notes (Signed)
   PM rounds on the patient.  He is awake and intubated.  He has had 3 episodes of bradycardia and these seem to be unprovoked and preceded any change in mentation.  They were self limited.  I have reviewed the rhythm strips and there is junctional bradycardia with rates in the 40s narrow escape but probably transient CHB.   He denies any chest pain.     BP (!) 119/94   Pulse 86   Temp 100 F (37.8 C)   Resp 14   Ht 5\' 10"  (1.778 m)   Wt 161 lb 9.6 oz (73.3 kg)   SpO2 100%   BMI 23.19 kg/m  GENERAL:  No acute distress.  He is awake and alert NECK:  No jugular venous distention, waveform within normal limits, carotid upstroke brisk and symmetric, no bruits, no thyromegaly LUNGS:  Clear to auscultation bilaterally CHEST:  Unremarkable HEART:  PMI not displaced or sustained,S1 and S2 within normal limits, no S3, no S4, no clicks, no rubs, no murmurs ABD:  Flat, positive bowel sounds normal in frequency in pitch, no bruits, no rebound, no guarding, no midline pulsatile mass, no hepatomegaly, no splenomegaly EXT:  2 plus pulses throughout, no edema, no cyanosis no clubbing  EKG:   NSR, rate 94, axis WNL, intervals WNL, anterior mild T wave inversion.  Early transition lead V2.    Bradycardia:  Events witnessed today.  Appears to be more likely a primary arrhythmia although still somewhat unclear.  Could be ischemic.  Enzymes have trended down and he has not had acute ST elevation and denies chest pain.  However, pending the creat in the AM might need to consider cardiac cath prior to bronch.  Continue supportive care.  If he has any sustained brady events might need a temporary wire.  Has transcutaneous pacing pads in place.

## 2017-07-21 ENCOUNTER — Inpatient Hospital Stay (HOSPITAL_COMMUNITY): Payer: Medicare HMO

## 2017-07-21 LAB — GLUCOSE, CAPILLARY
GLUCOSE-CAPILLARY: 85 mg/dL (ref 65–99)
GLUCOSE-CAPILLARY: 105 mg/dL — AB (ref 65–99)
Glucose-Capillary: 69 mg/dL (ref 65–99)
Glucose-Capillary: 84 mg/dL (ref 65–99)
Glucose-Capillary: 61 mg/dL — ABNORMAL LOW (ref 65–99)
Glucose-Capillary: 90 mg/dL (ref 65–99)

## 2017-07-21 LAB — PROTIME-INR
INR: 1.17
PROTHROMBIN TIME: 14.8 s (ref 11.4–15.2)

## 2017-07-21 LAB — URINALYSIS, ROUTINE W REFLEX MICROSCOPIC
Bilirubin Urine: NEGATIVE
GLUCOSE, UA: NEGATIVE mg/dL
KETONES UR: NEGATIVE mg/dL
NITRITE: NEGATIVE
Protein, ur: NEGATIVE mg/dL
Specific Gravity, Urine: 1.028 (ref 1.005–1.030)
pH: 5 (ref 5.0–8.0)

## 2017-07-21 LAB — CBC WITH DIFFERENTIAL/PLATELET
Basophils Absolute: 0 10*3/uL (ref 0.0–0.1)
Basophils Relative: 0 %
EOS PCT: 2 %
Eosinophils Absolute: 0.1 10*3/uL (ref 0.0–0.7)
HEMATOCRIT: 31.5 % — AB (ref 39.0–52.0)
Hemoglobin: 10.3 g/dL — ABNORMAL LOW (ref 13.0–17.0)
LYMPHS ABS: 0.5 10*3/uL — AB (ref 0.7–4.0)
LYMPHS PCT: 6 %
MCH: 31.2 pg (ref 26.0–34.0)
MCHC: 32.7 g/dL (ref 30.0–36.0)
MCV: 95.5 fL (ref 78.0–100.0)
Monocytes Absolute: 0.6 10*3/uL (ref 0.1–1.0)
Monocytes Relative: 7 %
Neutro Abs: 6.9 10*3/uL (ref 1.7–7.7)
Neutrophils Relative %: 85 %
PLATELETS: 151 10*3/uL (ref 150–400)
RBC: 3.3 MIL/uL — ABNORMAL LOW (ref 4.22–5.81)
RDW: 13.2 % (ref 11.5–15.5)
WBC: 8.1 10*3/uL (ref 4.0–10.5)

## 2017-07-21 LAB — COMPREHENSIVE METABOLIC PANEL
ALBUMIN: 2.3 g/dL — AB (ref 3.5–5.0)
ALT: 1413 U/L — ABNORMAL HIGH (ref 17–63)
AST: 1616 U/L — AB (ref 15–41)
Alkaline Phosphatase: 163 U/L — ABNORMAL HIGH (ref 38–126)
Anion gap: 7 (ref 5–15)
BUN: 19 mg/dL (ref 6–20)
CO2: 24 mmol/L (ref 22–32)
Calcium: 7.3 mg/dL — ABNORMAL LOW (ref 8.9–10.3)
Chloride: 101 mmol/L (ref 101–111)
Creatinine, Ser: 1.63 mg/dL — ABNORMAL HIGH (ref 0.61–1.24)
GFR calc Af Amer: 49 mL/min — ABNORMAL LOW (ref >60)
GFR calc non Af Amer: 42 mL/min — ABNORMAL LOW (ref >60)
GLUCOSE: 79 mg/dL (ref 65–99)
POTASSIUM: 3.8 mmol/L (ref 3.5–5.1)
SODIUM: 132 mmol/L — AB (ref 135–145)
Total Bilirubin: 1.8 mg/dL — ABNORMAL HIGH (ref 0.3–1.2)
Total Protein: 5.1 g/dL — ABNORMAL LOW (ref 6.5–8.1)

## 2017-07-21 LAB — OSMOLALITY, URINE: Osmolality, Ur: 402 mOsm/kg (ref 300–900)

## 2017-07-21 LAB — HEPARIN LEVEL (UNFRACTIONATED): HEPARIN UNFRACTIONATED: 0.52 [IU]/mL (ref 0.30–0.70)

## 2017-07-21 LAB — CULTURE, RESPIRATORY W GRAM STAIN

## 2017-07-21 LAB — HEPATITIS PANEL, ACUTE
Hep A IgM: NEGATIVE
Hep B C IgM: NEGATIVE
Hepatitis B Surface Ag: NEGATIVE

## 2017-07-21 LAB — CULTURE, RESPIRATORY: CULTURE: NORMAL

## 2017-07-21 LAB — APTT: APTT: 88 s — AB (ref 24–36)

## 2017-07-21 LAB — SODIUM, URINE, RANDOM: Sodium, Ur: 10 mmol/L

## 2017-07-21 LAB — AFP TUMOR MARKER: AFP, Serum, Tumor Marker: 3.8 ng/mL (ref 0.0–8.3)

## 2017-07-21 MED ORDER — SODIUM CHLORIDE 0.9 % IV BOLUS (SEPSIS)
500.0000 mL | Freq: Once | INTRAVENOUS | Status: AC
  Administered 2017-07-21: 500 mL via INTRAVENOUS

## 2017-07-21 MED ORDER — SODIUM CHLORIDE 0.9 % IV SOLN
INTRAVENOUS | Status: DC
  Administered 2017-07-21: 16:00:00 via INTRAVENOUS

## 2017-07-21 MED ORDER — DEXTROSE 50 % IV SOLN
INTRAVENOUS | Status: AC
  Filled 2017-07-21: qty 50

## 2017-07-21 MED ORDER — DEXTROSE 50 % IV SOLN
25.0000 mL | Freq: Once | INTRAVENOUS | Status: AC
  Administered 2017-07-21: 25 mL via INTRAVENOUS

## 2017-07-21 MED ORDER — SODIUM CHLORIDE 0.9% FLUSH: 10.0000 mL | INTRAVENOUS | Status: DC | PRN

## 2017-07-21 MED ORDER — SODIUM CHLORIDE 0.9% FLUSH
10.0000 mL | Freq: Two times a day (BID) | INTRAVENOUS | Status: DC
  Administered 2017-07-21 – 2017-08-01 (×13): 10 mL

## 2017-07-21 NOTE — Progress Notes (Signed)
Spoke with pharmacy regarding staggering of medication with Hypotension, including lasix, mididrine, and cardizem.  Cardizem dose late from 1200. Ok to give and stagger doses. Continue to monitor

## 2017-07-21 NOTE — H&P (Signed)
PULMONARY / CRITICAL CARE MEDICINE   Name: Adam Douthat Sr. MRN: [id number] DOB: Jan 07, 1951    ADMISSION DATE:  08/04/2017 CONSULTATION DATE:  07/15/2017  REFERRING MD:  Dr. Tomi Bamberger  CHIEF COMPLAINT:  Chest Pain   HISTORY OF PRESENT ILLNESS:   66 year old male with PMH of CAD s/p RCA stent 2013, AAA s/p open repair in 2016 with aortiobifemoral bypass graft, HTN, HLD, ETOH  Presents to ED on 11/11 after wife found patient face down for unknown time. When EMS arrived patient complaining of central chest pain/syncope. Family states he took an unknown amount of nitro tablets. EMS gave 9 mcg EPI and 1 mg Atropine for Bradycardia and Hypotension. Upon arrival to ED patient is diaphoretic and non-rebreather. Intubated in ED. ABG 7.353/25.5/282. ETOH 155. BP Systolic 64-33, on external pacer, underlying HR 15. Cardiology consulted. PCCM asked to admit.   Upon arrival to ED patient is on 20 Levophed with Systolic 40 and currently on external pacer. When looking at [facility] bottle, patient only missing 4 tablets (family states they are sure he took one on Halloween)  Family reports for the last few months patient has been complaining of progressive dyspnea, over the last couple of weeks intermittent chest pain. Per family patient drinks heavily.      SUBJECTIVE:  Episodes of vagal response-not noted overnight dopmaine 5  VITAL SIGNS: BP 105/90   Pulse 82   Temp 98.4 F (36.9 C) (Core)   Resp 14   Ht 5' 10"  (1.778 m)   Wt 72.8 kg (160 lb 7.9 oz)   SpO2 100%   BMI 23.03 kg/m   HEMODYNAMICS: CVP:  [11 mmHg-14 mmHg] 11 mmHg  VENTILATOR SETTINGS: Vent Mode: PRVC FiO2 (%):  [40 %] 40 % Set Rate:  [14 bmp-18 bmp] 14 bmp Vt Set:  [580 mL] 580 mL PEEP:  [5 cmH20] 5 cmH20 Plateau Pressure:  [17 cmH20-21 cmH20] 21 cmH20  INTAKE / OUTPUT: I/O last 3 completed shifts: In: 5207.7 [I.V.:4440.4; NG/GT:467.3; IV Piggyback:300] Out: 1870 [IRJJO:8416; Emesis/NG output:400]  PHYSICAL  EXAMINATION: General: awake Neuro: fc HEENT:jvd evident PULM: ronchi CV: s1 s2 RRRno r SA:YTKZ,SW wnl, no r/g Extremities: edema min upper arms      LABS:  BMET Recent Labs  Lab 07/19/17 0447 07/20/17 0313 07/21/17 0500  NA 130* 130* 132*  K 3.7 3.7 3.8  CL 99* 100* 101  CO2 22 22 24   BUN 16 21* 19  CREATININE 1.72* 1.96* 1.63*  GLUCOSE 149* 108* 79    Electrolytes Recent Labs  Lab 07/19/17 0447  07/19/17 1950 07/20/17 0313 07/20/17 1640 07/21/17 0500  CALCIUM 6.9*  --   --  7.1*  --  7.3*  MG 1.8   < > 1.7 1.6* 1.9  --   PHOS 2.1*   < > 4.3 3.3 3.0  --    < > = values in this interval not displayed.    CBC Recent Labs  Lab 07/19/17 0447 07/20/17 0313 07/21/17 0500  WBC 8.6 9.4 8.1  HGB 10.9* 10.3* 10.3*  HCT 33.3* 30.8* 31.5*  PLT 128* 159 151    Coag's Recent Labs  Lab 07/31/2017 1855 07/15/2017 2229  APTT  --  52*  INR 1.15  --     Sepsis Markers Recent Labs  Lab 07/17/2017 2229 07/19/2017 2303 07/19/17 0447 07/20/17 0313 07/20/17 1102  LATICACIDVEN  --  7.1* 3.4*  --  1.1  PROCALCITON <0.10  --  1.10 1.46  --  ABG Recent Labs  Lab 07/19/17 0130 07/19/17 1010 07/20/17 0159  PHART 7.306* 7.411 7.489*  PCO2ART 37.8 36.8 27.8*  PO2ART 90.8 148.0* 102.0    Liver Enzymes Recent Labs  Lab 07/28/2017 1848 07/20/17 0313 07/21/17 0500  AST 86* 4,288* 1,616*  ALT 38 2,117* 1,413*  ALKPHOS 123 168* 163*  BILITOT 0.7 1.1 1.8*  ALBUMIN 2.8* 2.3* 2.3*    Cardiac Enzymes Recent Labs  Lab 07/20/17 0313 07/20/17 0900 07/20/17 1640  TROPONINI 27.43* 29.86* 24.97*    Glucose Recent Labs  Lab 07/20/17 1652 07/20/17 2032 07/20/17 2320 07/21/17 0415 07/21/17 0533 07/21/17 0757  GLUCAP 90 82 77 69 85 84    Imaging Dg Chest Port 1 View  Result Date: 07/21/2017 CLINICAL DATA:  Endotracheal tube, acute respiratory failure. EXAM: PORTABLE CHEST 1 VIEW COMPARISON:  07/20/2017 and CT chest 08/03/2017. FINDINGS: Endotracheal  tube terminates 6.5 cm above the carina. Nasogastric tube terminates just beyond the gastroesophageal junction. Defibrillator pads project over the left hemithorax. Heart size is grossly stable. Thoracic aorta is calcified. Cavitary lesion in the medial aspect of the right upper lobe and an irregular area of nodular consolidation in the right lower lobe are poorly visualized. Small right pleural effusion. Findings appear unchanged from yesterday. IMPRESSION: 1. Small right pleural effusion. 2. Known irregular nodular lesions in the right upper and right lower lobes are obscured. 3.  Aortic atherosclerosis (ICD10-170.0). Electronically Signed   By: Lorin Picket M.D.   On: 07/21/2017 08:00     STUDIES:  CXR 11/11 > Endotracheal tube 5.7 cm from carina. Enteric tube tip below the field of view in the abdomen. Transcutaneous pacing pads noted. Stable cardiac silhouette. Aortic atherosclerosis. Clear lungs. No acute osseous abnormality is evident ABD 11/11 > Nonobstructive bowel gas pattern. Enteric tube with tip over the stomach and side-port at the region of the gastroesophageal junction. This could be advanced approximately 8 cm. CTA Chest 11/11 > 2.9 cm right upper lobe cavitary spiculated nodule and 3.7 cm right lower lobe mass. Findings probably represent synchronous or primary/metastatic lung cancer. Distant metastatic disease is less likely. The right upper lobe lesion invades the hilum. There is a satellite nodule inferior to the right lower lobe lesion. Sub- carinal lymphadenopathy. 2. No aortic dissection. 3. Left common carotid artery origin fibrofatty plaque with moderate 50% stenosis. 4. Cardiomegaly and severe coronary artery calcification.  CT A/P 11/11 > 1. Diffuse gallbladder wall thickening and extensive inflammation surrounding the pancreas. Inflammation is likely due to acute pancreatitis with either reactive changes of the gallbladder or concurrent acute cholecystitis. 2. No  aortic dissection. 3. Infrarenal abdominal aortic repair with patent bilateral aortofemoral bypass. Extensive atherosclerotic disease with severe proximal femoral artery stenosis, severe mid SMA stenosis, severe bilateral renal artery stenosis. 4. Layering density within the gallbladder may represent small stones or vicarious excretion of contrast. 5. Small volume of ascites.  [place] 11/13>>>Three liver masses measuring up to 3.2 cm, favor metastatic disease given chest CT findings yesterday. 2. Cholelithiasis. No visible choledocholithiasis or common bile duct dilatation. 3. Prominent gallbladder wall edema. This could be reactive rather than cholecystitis given the gallbladder is not over dilated  CULTURES: U/A 11/11 > Negative  Blood Culture 11/11 >> Sputum 11/11 >>  ANTIBIOTICS: Vancomycin 11/11>>>11/12,11/13>>> Zosyn 11/11>>>  SIGNIFICANT EVENTS: 11/11 > Presents to ED  1/12- shock, brady, vagal  LINES/TUBES: ETT 11/11 >> Right IJ 11/11 >>  Right Femoral 11/11 >>  DISCUSSION: 66 year old male presents to ED hypoxic, hypotensive,  and bradycardiac. Intubated in ED. Cardiology consulted.   ASSESSMENT / PLAN:  PULMONARY A: Acute Hypoxic Respiratory Failure  CT Chest with 2.9 cm right upper lobe cavitary spiculated nodule and 3.7 cm right lower lobe mass highly suspicious for malignancy  With mets H/O Tobacco Use  P:   Consider SBt cpap 5 ps5, goal 2 hours I am concerned about extubation with his cardiac status and prior events  No extubation planned Bronch am for cytology,assessment  CARDIOVASCULAR A:  Septic vs Cardiogenic Shock, acidosis related likely  CAD s/p RCA stent 2013, AAA repair 2016 H/O HTN, HLD  CT Chest with cardiomegaly and severe coronary artery calcification.  Rule out ischemia, cath at some point per cards Trop elevated P:  dopamine to MAP 65 , too ff if able Asa Heparin drip  RENAL A:   Anion Gap Metabolic Acidosis  ATN likely, slight  better P:   kvo Chem in am  crttrend better, repeat pos balance goals  GASTROINTESTINAL A:   Transaminase elevation- resolving, shock liver, MI contribtuion Met liver , primary lung? [place] neg gallbaldder P:   restartTF Assess aFP  - wnl  HEMATOLOGIC A:   VTE Prop  Rule out ischemia anemia P:  Trend CBC in am   SCDs  Dc heparin if bronch 8 am coags now with lF tnoted   INFECTIOUS A:   NO PNA Assess risk abdo source, ischemia  P:   LFT better,less concerning active abdo process Dc vanc likely to narrow further in am   ENDOCRINE A:   Hyperglycemia  Now bordwerline low HIGH cortisol response-poor outcome>? P:   Trend Glucose SSI  dc  NEUROLOGIC A:   Acute Metabolic Encephalopathy  H/O ETOH ETOH 155  P:   RASS goal: 0/-1 If needed add back fent Folic Acid/Thiamine - ensure IV Lactulose keep Dc benzo,brady? lowBP  FAMILY  - Updates: calling family today,they were to show up  - Inter-disciplinary family meet or Palliative Care meeting due by: 07/25/2017   Ccm time 35 min   Lavon Paganini. Titus Mould, MD, [place] Pgr: [facility]

## 2017-07-21 NOTE — Progress Notes (Signed)
Pts blood sugar was 69. Given half amp D50 per hypoglycemia protocol. Recheck 85. Will continue to monitor.

## 2017-07-21 NOTE — Plan of Care (Signed)
Patient had one episode today of decreased BP and heart rate. Possible cardiac catheterization in the next couple of days. Bronchoscopy scheduled for tomorrow. Continues on heparin, dopamine. Ivf fluids restarted.

## 2017-07-21 NOTE — Progress Notes (Signed)
[facility] for Heparin Indication: chest pain/ACS  Allergies  Allergen Reactions  . Chlorhexidine Itching and Rash  . Codeine Itching and Rash    Patient Measurements: Height: 5\' 10"  (177.8 cm) Weight: 160 lb 7.9 oz (72.8 kg) IBW/kg (Calculated) : 73  Vital Signs: Temp: 98.4 F (36.9 C) (11/14 1000) Temp Source: Core (11/14 1000) BP: 105/90 (11/14 1000) Pulse Rate: 82 (11/14 1000)  Labs: Recent Labs    07/17/2017 1855 07/22/2017 2229 07/19/17 0447  07/19/17 1950  07/20/17 0313 07/20/17 0900 07/20/17 1130 07/20/17 1640 07/21/17 0500  HGB  --   --  10.9*  --   --   --  10.3*  --   --   --  10.3*  HCT  --   --  33.3*  --   --   --  30.8*  --   --   --  31.5*  PLT  --   --  128*  --   --   --  159  --   --   --  151  APTT  --  52*  --   --   --   --   --   --   --   --   --   LABPROT 14.6  --   --   --   --   --   --   --   --   --   --   INR 1.15  --   --   --   --   --   --   --   --   --   --   HEPARINUNFRC  --   --   --    < > 0.44  --  0.49  --   --   --  0.52  CREATININE  --  1.46* 1.72*  --   --   --  1.96*  --   --   --  1.63*  CKTOTAL  --   --   --   --   --   --   --   --  698*  --   --   TROPONINI  --   --  9.14*  --   --    < > 27.43* 29.86*  --  24.97*  --    < > = values in this interval not displayed.    Estimated Creatinine Clearance: 45.9 mL/min (A) (by C-G formula based on SCr of 1.63 mg/dL (H)).  Assessment: 66 y.o. male with VDRF/cardiogenic shock, elevated cardiac markers, for heparin.  Heparin continues to be at goal this morning. No bleeding noted, hgb stable 10s, plt count stable. Note d-dimer is elevated at >20. Cath planned for tomorrow.   Goal of Therapy:  Heparin level 0.3-0.7 units/ml Monitor platelets by anticoagulation protocol: Yes   Plan:  Continue Heparin at current rate  Erin Hearing PharmD., BCPS Clinical Pharmacist Pager [phone] 07/21/2017 10:28 AM

## 2017-07-21 NOTE — Progress Notes (Signed)
Progress Note  Patient Name: Adam Reggio Sr. Date of Encounter: 07/21/2017  Primary Cardiologist:   Dr. Jovita Gamma  Subjective   Patient is intubated.  However awake and answering questions appropriately.  Denies chest pain.   Inpatient Medications    Scheduled Meds: . aspirin  81 mg Oral Daily  . dextrose      . folic acid  1 mg Intravenous Daily  . insulin aspart  1-3 Units Subcutaneous Q4H  . ipratropium-albuterol  3 mL Nebulization Q6H  . lactulose  30 g Oral Daily  . LORazepam  1 mg Intravenous Q6H  . mouth rinse  15 mL Mouth Rinse Q2H  . ondansetron (ZOFRAN) IV  4 mg Intravenous Once  . pantoprazole sodium  40 mg Per Tube QHS  . thiamine injection  100 mg Intravenous Daily   Continuous Infusions: . sodium chloride 10 mL/hr at 07/20/17 1900  . sodium chloride 250 mL (07/20/17 1900)  . DOPamine 5 mcg/kg/min (07/20/17 2333)  . feeding supplement (VITAL AF 1.2 CAL) Stopped (07/20/17 0016)  . fentaNYL infusion INTRAVENOUS Stopped (07/21/17 0815)  . heparin 1,050 Units/hr (07/21/17 0549)  . norepinephrine (LEVOPHED) Adult infusion 2 mcg/min (07/20/17 2340)  . piperacillin-tazobactam (ZOSYN)  IV 3.375 g (07/21/17 0536)  . vancomycin Stopped (07/20/17 1301)  . vasopressin (PITRESSIN) infusion - *FOR SHOCK* Stopped (07/19/17 1830)   PRN Meds: sodium chloride, atropine, fentaNYL, ibuprofen, midazolam   Vital Signs    Vitals:   07/21/17 0700 07/21/17 0730 07/21/17 0800 07/21/17 0900  BP: 108/77 (!) 88/67 112/78 128/74  Pulse: 80 81 81 82  Resp: 14 16 14 14   Temp: 98.4 F (36.9 C) 98.4 F (36.9 C) 98.4 F (36.9 C) 98.4 F (36.9 C)  TempSrc:   Core (Comment) Core  SpO2: 100% 100% 100% 100%  Weight:      Height:        Intake/Output Summary (Last 24 hours) at 07/21/2017 0904 Last data filed at 07/21/2017 0900 Gross per 24 hour  Intake 1636.02 ml  Output 1320 ml  Net 316.02 ml   Filed Weights   07/19/17 0500 07/20/17 0356 07/21/17 0500    Weight: 156 lb 1.4 oz (70.8 kg) 161 lb 9.6 oz (73.3 kg) 160 lb 7.9 oz (72.8 kg)    Telemetry    NSR, no sustained pauses.   ECG    NA  Physical Exam   GEN: No  acute distress.  Intubated but awake Neck: No  JVD Cardiac: RRR,  murmurs, rubs, or gallops.  Respiratory: Clear to auscultation bilaterally. GI: Soft, nontender, non-distended, normal bowel sounds.   MS:  Diffuse edema; No deformity. Neuro:   Nonfocal  Psych:  Intubated but answers questions.    Labs    Chemistry Recent Labs  Lab 07/14/2017 1848  07/19/17 0447 07/20/17 0313 07/21/17 0500  NA 127*   < > 130* 130* 132*  K 4.7   < > 3.7 3.7 3.8  CL 98*   < > 99* 100* 101  CO2 19*   < > 22 22 24   GLUCOSE 100*   < > 149* 108* 79  BUN 13   < > 16 21* 19  CREATININE 1.39*   < > 1.72* 1.96* 1.63*  CALCIUM 8.1*   < > 6.9* 7.1* 7.3*  PROT 6.3*  --   --  5.0* 5.1*  ALBUMIN 2.8*  --   --  2.3* 2.3*  AST 86*  --   --  4,288* 1,616*  ALT 38  --   --  2,117* 1,413*  ALKPHOS 123  --   --  168* 163*  BILITOT 0.7  --   --  1.1 1.8*  GFRNONAA 51*   < > 40* 34* 42*  GFRAA 59*   < > 46* 39* 49*  ANIONGAP 10   < > 9 8 7    < > = values in this interval not displayed.     Hematology Recent Labs  Lab 07/19/17 0447 07/20/17 0313 07/21/17 0500  WBC 8.6 9.4 8.1  RBC 3.52* 3.26* 3.30*  HGB 10.9* 10.3* 10.3*  HCT 33.3* 30.8* 31.5*  MCV 94.6 94.5 95.5  MCH 31.0 31.6 31.2  MCHC 32.7 33.4 32.7  RDW 12.8 13.3 13.2  PLT 128* 159 151    Cardiac Enzymes Recent Labs  Lab 07/19/17 2145 07/20/17 0313 07/20/17 0900 07/20/17 1640  TROPONINI 30.66* 27.43* 29.86* 24.97*    Recent Labs  Lab 07/28/2017 1906 07/28/2017 2121  TROPIPOC 0.25* 0.29*     BNPNo results for input(s): BNP, PROBNP in the last 168 hours.   DDimer  Recent Labs  Lab 07/20/17 0313  DDIMER >20.00*     Radiology    US Abdomen Complete  Result Date: 07/19/2017 CLINICAL DATA:  Pancreatitis EXAM: ABDOMEN ULTRASOUND COMPLETE COMPARISON:  Abdominal  CT from yesterday FINDINGS: Gallbladder: Edematous and striated thickened wall measuring 11 mm. Layering calculi that are small. Patient is ventilated. Common bile duct: Diameter: 6 mm. Where visualized, no filling defect. Liver: 2 right and 1 left hypoechoic liver mass, likely metastatic disease given chest findings from yesterday. The masses are lobulated and measure up to 3.2 cm in the right liver. Portal vein is patent on color Doppler imaging with normal direction of blood flow towards the liver. IVC: No abnormality visualized. Pancreas: Visualized portion unremarkable. Spleen: Size and appearance within normal limits. Right Kidney: Length: 10 cm. Echogenicity within normal limits. No mass or hydronephrosis visualized. Left Kidney: Length: 9 cm. Echogenicity within normal limits. No mass or hydronephrosis visualized. Abdominal aorta: Atherosclerotic plaque. Other findings: Small volume ascites. IMPRESSION: 1. Three liver masses measuring up to 3.2 cm, favor metastatic disease given chest CT findings yesterday. 2. Cholelithiasis. No visible choledocholithiasis or common bile duct dilatation. 3. Prominent gallbladder wall edema. This could be reactive rather than cholecystitis given the gallbladder is not over dilated. Patient is intubated, limiting Murphy sign. Electronically Signed   By: Monte Fantasia M.D.   On: 07/19/2017 11:26   Dg Chest Port 1 View  Result Date: 07/21/2017 CLINICAL DATA:  Endotracheal tube, acute respiratory failure. EXAM: PORTABLE CHEST 1 VIEW COMPARISON:  07/20/2017 and CT chest 07/24/2017. FINDINGS: Endotracheal tube terminates 6.5 cm above the carina. Nasogastric tube terminates just beyond the gastroesophageal junction. Defibrillator pads project over the left hemithorax. Heart size is grossly stable. Thoracic aorta is calcified. Cavitary lesion in the medial aspect of the right upper lobe and an irregular area of nodular consolidation in the right lower lobe are poorly  visualized. Small right pleural effusion. Findings appear unchanged from yesterday. IMPRESSION: 1. Small right pleural effusion. 2. Known irregular nodular lesions in the right upper and right lower lobes are obscured. 3.  Aortic atherosclerosis (ICD10-170.0). Electronically Signed   By: Lorin Picket M.D.   On: 07/21/2017 08:00   Dg Chest Port 1 View  Result Date: 07/20/2017 CLINICAL DATA:  Check endotracheal tube placement EXAM: PORTABLE CHEST 1 VIEW COMPARISON:  07/19/2017 FINDINGS: Cardiac shadow is stable. Aortic calcifications are again  seen. Endotracheal tube, nasogastric catheter and right jugular central line are again noted and stable. The lungs are well aerated bilaterally. The changes in the right lung in the suprahilar and infrahilar region seen on previous CT are less conspicuous on today's exam due to some patient rotation. Posteriorly layering effusion on the right is noted. No bony abnormality is seen. IMPRESSION: Slight increase in the degree of right-sided pleural effusion when compared with prior exam. The known suprahilar and infrahilar changes on the right are less conspicuous on today's exam due to patient rotation Electronically Signed   By: Inez Catalina M.D.   On: 07/20/2017 07:53    Cardiac Studies   ECHO:   Study Conclusions  - Left ventricle: The cavity size was normal. There was moderate   concentric hypertrophy. Systolic function was normal. The   estimated ejection fraction was in the range of 60% to 65%. Wall   motion was normal; there were no regional wall motion   abnormalities. - Mitral valve: Calcified annulus. - Right ventricle: Systolic function was moderately reduced. - Tricuspid valve: There was mild regurgitation. - Pericardium, extracardiac: The pericardium appears thickened at   the apex as well as the apical RV wall with small pericardial   effusion over the apex. Suggest cardiac MRI to evaluate   pericardium further.   Patient Profile       66 y.o. male with PMH CAD s/p RCA stent 2013, AAA s/p open repair 2016 with aortobifemoral bypass grafts, HTN, dyslipidemia seen in consult at the request of Dr. Tomi Bamberger for bradycardia, hypotension, and syncope.  Assessment & Plan    NSTEMI:  Presevered EF and trop trending down.  No evidence of cardiogenic shock.   Plan cath likely tomorrow as renal function has improved.   BRADYCARDIA:  I reviewed telemetry and he had no further episodes overnight.  Continue to follow.   AKI:  Creat up mildly but mildly improved.  Continue to follow.    LUNG MASS:  Cavitary.  Likely newly diagnosed lung cancer with mets to liver.   Discussed with Dr. Titus Mould.  Bronch tomorrow morning.    ELEVATED LIVER ENZYMES:   Trending down.   ABNORMAL PERICARDIUM:  See report above.  This could represent malignancy given the lung and liver lesions.  MRI would be needed to further image this but not indicated at this time.  Effusion is not hemodynamically significant.    For questions or updates, please contact [employer] Please consult [email or web address] for contact info under Cardiology/STEMI.   Signed, Minus Breeding, MD  07/21/2017, 9:04 AM

## 2017-07-21 NOTE — Progress Notes (Signed)
Patient placed back on full support for rest-RN had to increase sedation

## 2017-07-21 NOTE — Progress Notes (Signed)
Central line dressing change. When initially started did swipe with cholorhexidine, and realized patient allergic. Immediately washed off with sterile saline and redressed, no c/o itching or rash noted.

## 2017-07-21 NOTE — Progress Notes (Addendum)
1400 BP 68/40 (50), heart rate initially maintained at 80.  RN increased dopamine back to 4 mcg.  At 1410,  Heart rate 46-50's.  RN assessed patient, patient was responsive and following commands however patients eyes were rolled up and patient was not making eye contact.  Patient's face also became very red for approximately 15 seconds and then returned to normal color.  Patient's oxygen saturations remained stable.    1418 - heart rate back in the 90's and BP 83/64 (72)

## 2017-07-21 NOTE — Progress Notes (Signed)
RT attempted SBT.  Patient failed due to low RR when patient would fall back to sleep.  Patient would awaken to ventilator alarm and RR would increase but then patient would fall back asleep.  Will ask RT to attempt SBT later in the shift.

## 2017-07-22 ENCOUNTER — Inpatient Hospital Stay (HOSPITAL_COMMUNITY): Payer: Medicare HMO

## 2017-07-22 DIAGNOSIS — E872 Acidosis: Secondary | ICD-10-CM

## 2017-07-22 DIAGNOSIS — J9602 Acute respiratory failure with hypercapnia: Secondary | ICD-10-CM

## 2017-07-22 LAB — GLUCOSE, CAPILLARY
GLUCOSE-CAPILLARY: 134 mg/dL — AB (ref 65–99)
GLUCOSE-CAPILLARY: 86 mg/dL (ref 65–99)
GLUCOSE-CAPILLARY: 74 mg/dL (ref 65–99)
GLUCOSE-CAPILLARY: 98 mg/dL (ref 65–99)
GLUCOSE-CAPILLARY: 84 mg/dL (ref 65–99)
Glucose-Capillary: 101 mg/dL — ABNORMAL HIGH (ref 65–99)
Glucose-Capillary: 99 mg/dL (ref 65–99)

## 2017-07-22 LAB — BASIC METABOLIC PANEL
Anion gap: 6 (ref 5–15)
BUN: 18 mg/dL (ref 6–20)
CHLORIDE: 103 mmol/L (ref 101–111)
CO2: 25 mmol/L (ref 22–32)
CREATININE: 1.59 mg/dL — AB (ref 0.61–1.24)
Calcium: 7.4 mg/dL — ABNORMAL LOW (ref 8.9–10.3)
GFR calc non Af Amer: 44 mL/min — ABNORMAL LOW (ref >60)
GFR, EST AFRICAN AMERICAN: 51 mL/min — AB (ref >60)
Glucose, Bld: 99 mg/dL (ref 65–99)
Potassium: 3.7 mmol/L (ref 3.5–5.1)
SODIUM: 134 mmol/L — AB (ref 135–145)

## 2017-07-22 LAB — CBC
HEMATOCRIT: 30.1 % — AB (ref 39.0–52.0)
HEMOGLOBIN: 9.6 g/dL — AB (ref 13.0–17.0)
MCH: 30.8 pg (ref 26.0–34.0)
MCHC: 31.9 g/dL (ref 30.0–36.0)
MCV: 96.5 fL (ref 78.0–100.0)
Platelets: 147 10*3/uL — ABNORMAL LOW (ref 150–400)
RBC: 3.12 MIL/uL — AB (ref 4.22–5.81)
RDW: 13.3 % (ref 11.5–15.5)
WBC: 7.6 10*3/uL (ref 4.0–10.5)

## 2017-07-22 LAB — HEPARIN LEVEL (UNFRACTIONATED): Heparin Unfractionated: 0.29 IU/mL — ABNORMAL LOW (ref 0.30–0.70)

## 2017-07-22 MED ORDER — ETOMIDATE 2 MG/ML IV SOLN
INTRAVENOUS | Status: AC
  Filled 2017-07-22: qty 10

## 2017-07-22 MED ORDER — ETOMIDATE 2 MG/ML IV SOLN
20.0000 mg | Freq: Once | INTRAVENOUS | Status: AC
  Administered 2017-07-22: 20 mg via INTRAVENOUS

## 2017-07-22 MED ORDER — IBUPROFEN 100 MG/5ML PO SUSP
400.0000 mg | Freq: Four times a day (QID) | ORAL | Status: DC | PRN
  Administered 2017-07-23 (×2): 400 mg
  Filled 2017-07-22 (×3): qty 20

## 2017-07-22 MED ORDER — HEPARIN (PORCINE) IN NACL 100-0.45 UNIT/ML-% IJ SOLN: 1300.0000 [IU]/h | INTRAMUSCULAR | Status: DC

## 2017-07-22 MED ORDER — MIDAZOLAM HCL 2 MG/2ML IJ SOLN
INTRAMUSCULAR | Status: AC
  Filled 2017-07-22: qty 2

## 2017-07-22 MED ORDER — ACETAMINOPHEN 160 MG/5ML PO SOLN: 650.0000 mg | Freq: Four times a day (QID) | ORAL | Status: DC | PRN

## 2017-07-22 MED ORDER — SODIUM CHLORIDE 0.9 % IV SOLN
10.0000 mg/h | INTRAVENOUS | Status: DC
  Administered 2017-07-22: 0.5 mg/h via INTRAVENOUS
  Filled 2017-07-22 (×2): qty 10

## 2017-07-22 MED ORDER — FENTANYL CITRATE (PF) 100 MCG/2ML IJ SOLN
INTRAMUSCULAR | Status: AC
  Filled 2017-07-22: qty 2

## 2017-07-22 MED ORDER — DEXTROSE 5 % IV SOLN
2.0000 g | INTRAVENOUS | Status: DC
  Administered 2017-07-22 – 2017-07-25 (×4): 2 g via INTRAVENOUS
  Filled 2017-07-22 (×4): qty 2

## 2017-07-22 NOTE — Procedures (Signed)
Bronchoscopy Procedure Note Adam Lopez [id number] 10-25-1950  Procedure: Bronchoscopy Indications: Diagnostic evaluation of the airways and Obtain specimens for culture and/or other diagnostic studies  Procedure Details Consent: Risks of procedure as well as the alternatives and risks of each were explained to the (patient/caregiver).  Consent for procedure obtained. Time Out: Verified patient identification, verified procedure, site/side was marked, verified correct patient position, special equipment/implants available, medications/allergies/relevent history reviewed, required imaging and test results available.  Performed  In preparation for procedure, patient was given 100% FiO2 and bronchoscope lubricated. Sedation: Etomidate  Airway entered and the following bronchi were examined: RUL, RML, RLL, LUL, LLL and Bronchi.   Procedures performed: Brushings performed - no Bronchoscope removed.  , Patient placed back on 100% FiO2 at conclusion of procedure.    Evaluation Hemodynamic Status: BP stable throughout; O2 sats: stable throughout Patient's Current Condition: stable Specimens:  Sent serosanguinous fluid Complications: No apparent complications Patient did tolerate procedure well.   Raylene Miyamoto. 07/22/2017   1 complete obstruction extrinsic to entire RUL puckering only open with lavage saline, no overt mass lesions seen 2. BAL RUL successful 3. Normal BI, mild edema RLL 4. BAL RLL 5. No endobronchial lesions but see above at RUL  Lavon Paganini. Titus Mould, MD, [place] Pgr: [facility]

## 2017-07-22 NOTE — Progress Notes (Signed)
[place] for Heparin Indication: chest pain/ACS  Allergies  Allergen Reactions  . Chlorhexidine Itching and Rash  . Codeine Itching and Rash    Patient Measurements: Height: 5\' 10"  (177.8 cm) Weight: 160 lb 4.4 oz (72.7 kg) IBW/kg (Calculated) : 73  Vital Signs: Temp: 97.5 F (36.4 C) (11/15 0900) Temp Source: Core (11/15 0800) BP: 129/79 (11/15 0900) Pulse Rate: 97 (11/15 0900)  Labs: Recent Labs    07/20/17 0313 07/20/17 0900 07/20/17 1130 07/20/17 1640 07/21/17 0500 07/21/17 1045 07/22/17 0430  HGB 10.3*  --   --   --  10.3*  --  9.6*  HCT 30.8*  --   --   --  31.5*  --  30.1*  PLT 159  --   --   --  151  --  147*  APTT  --   --   --   --   --  88*  --   LABPROT  --   --   --   --   --  14.8  --   INR  --   --   --   --   --  1.17  --   HEPARINUNFRC 0.49  --   --   --  0.52  --  0.29*  CREATININE 1.96*  --   --   --  1.63*  --  1.59*  CKTOTAL  --   --  698*  --   --   --   --   TROPONINI 27.43* 29.86*  --  24.97*  --   --   --     Estimated Creatinine Clearance: 47 mL/min (A) (by C-G formula based on SCr of 1.59 mg/dL (H)).  Assessment: 66 y.o. male with VDRF/cardiogenic shock, elevated cardiac markers, for heparin.  Heparin just below goal this morning at 0.29 then subsequently turned off for bronch. No overt bleeding noted, hgb stable 10s, plt count stable.  MD worried about possible bleeding from bronch brushings so will wait at least 4 hours to restart heparin, d/w nurse to let pharmacy know if bleeding noted to delay restart.   Possible cath planned for tomorrow.   Goal of Therapy:  Heparin level 0.3-0.7 units/ml Monitor platelets by anticoagulation protocol: Yes   Plan:  Restart heparin this afternoon at 1100 units/hr (slightly higher rate given below goal this morning) Follow for any s/s of bleeding post bronch. Check coags in am  Erin Hearing PharmD., BCPS Clinical Pharmacist Pager [phone] 07/22/2017  10:15 AM

## 2017-07-22 NOTE — Progress Notes (Signed)
PULMONARY / CRITICAL CARE MEDICINE   Name: Adam Lal Sr. MRN: [id number] DOB: 05-26-1951    ADMISSION DATE:  07/09/2017 CONSULTATION DATE:  07/10/2017  REFERRING MD:  Dr. Tomi Bamberger  CHIEF COMPLAINT:  Chest Pain   HISTORY OF PRESENT ILLNESS:   66 year old male with PMH of CAD s/p RCA stent 2013, AAA s/p open repair in 2016 with aortiobifemoral bypass graft, HTN, HLD, ETOH  Presents to ED on 11/11 after wife found patient face down for unknown time. When EMS arrived patient complaining of central chest pain/syncope. Family states he took an unknown amount of nitro tablets. EMS gave 9 mcg EPI and 1 mg Atropine for Bradycardia and Hypotension. Upon arrival to ED patient is diaphoretic and non-rebreather. Intubated in ED. ABG 7.353/25.5/282. ETOH 155. BP Systolic 88-28, on external pacer, underlying HR 15. Cardiology consulted. PCCM asked to admit.   Upon arrival to ED patient is on 20 Levophed with Systolic 40 and currently on external pacer. When looking at [facility] bottle, patient only missing 4 tablets (family states they are sure he took one on Halloween)  Family reports for the last few months patient has been complaining of progressive dyspnea, over the last couple of weeks intermittent chest pain. Per family patient drinks heavily.      SUBJECTIVE:  One episode brady, hypotension last 24 hours dop at 4 Levo remains off Awake 2.3 liter spos  VITAL SIGNS: BP (!) 137/97   Pulse 85   Temp (!) 97.5 F (36.4 C)   Resp 14   Ht _0  (1.778 m)   Wt 72.7 kg (160 lb 4.4 oz)   SpO2 100%   BMI 23.00 kg/m   HEMODYNAMICS: CVP:  [7 mmHg-18 mmHg] 18 mmHg  VENTILATOR SETTINGS: Vent Mode: PSV;CPAP FiO2 (%):  [30 %-40 %] 30 % Set Rate:  [14 bmp] 14 bmp Vt Set:  [580 mL] 580 mL PEEP:  [5 cmH20] 5 cmH20 Pressure Support:  [10 cmH20] 10 cmH20 Plateau Pressure:  [17 cmH20-21 cmH20] 20 cmH20  INTAKE / OUTPUT: I/O last 3 completed shifts: In: 3938.8 [P.O.:120; I.V.:2585.3;  Other:25; NG/GT:1008.5; IV Piggyback:200] Out: 1075 [Urine:1075]  PHYSICAL EXAMINATION: General: awake Neuro: awake, fc well HEENT: jvd about the same PULM: coarse slight, apical better CV: s1 s2 RRR not brady GI: soft, BS wnl, no r, no pain Extremities: no edema, brusiing       LABS:  BMET Recent Labs  Lab 07/20/17 0313 07/21/17 0500 07/22/17 0430  NA 130* 132* 134*  K 3.7 3.8 3.7  CL 100* 101 103  CO2 _1 BUN 21* 19 18  CREATININE 1.96* 1.63* 1.59*  GLUCOSE 108* 79 99    Electrolytes Recent Labs  Lab 07/19/17 1950 07/20/17 0313 07/20/17 1640 07/21/17 0500 07/22/17 0430  CALCIUM  --  7.1*  --  7.3* 7.4*  MG 1.7 1.6* 1.9  --   --   PHOS 4.3 3.3 3.0  --   --     CBC Recent Labs  Lab 07/20/17 0313 07/21/17 0500 07/22/17 0430  WBC 9.4 8.1 7.6  HGB 10.3* 10.3* 9.6*  HCT 30.8* 31.5* 30.1*  PLT 159 151 147*    Coag's Recent Labs  Lab 07/17/2017 1855 08/04/2017 2229 07/21/17 1045  APTT  --  52* 88*  INR 1.15  --  1.17    Sepsis Markers Recent Labs  Lab 07/24/2017 2229 08/04/2017 2303 07/19/17 0447 07/20/17 0313 07/20/17 1102  LATICACIDVEN  --  7.1* 3.4*  --  1.1  PROCALCITON <0.10  --  1.10 1.46  --     ABG Recent Labs  Lab 07/19/17 0130 07/19/17 1010 07/20/17 0159  PHART 7.306* 7.411 7.489*  PCO2ART 37.8 36.8 27.8*  PO2ART 90.8 148.0* 102.0    Liver Enzymes Recent Labs  Lab 07/12/2017 1848 07/20/17 0313 07/21/17 0500  AST 86* 4,288* 1,616*  ALT 38 2,117* 1,413*  ALKPHOS 123 168* 163*  BILITOT 0.7 1.1 1.8*  ALBUMIN 2.8* 2.3* 2.3*    Cardiac Enzymes Recent Labs  Lab 07/20/17 0313 07/20/17 0900 07/20/17 1640  TROPONINI 27.43* 29.86* 24.97*    Glucose Recent Labs  Lab 07/21/17 1250 07/21/17 1630 07/21/17 2020 07/21/17 2349 07/22/17 0559 07/22/17 0820  GLUCAP >600* 105* 90 101* 86 74    Imaging Dg Chest Port 1 View  Result Date: 07/22/2017 CLINICAL DATA:  Acute respiratory acidosis, ventilator dependent  respiratory failure, shock, bradycardia, former smoker. EXAM: PORTABLE CHEST 1 VIEW COMPARISON:  Chest x-ray of July 21, 2017 FINDINGS: The lungs are well-expanded. There is increased density in the mid and lower lungs greatest on the right likely reflecting posterior layering of pleural fluid. The cardiac silhouette is mildly enlarged. The pulmonary vascularity is not clearly engorged. The endotracheal tube tip lies approximately 3.8 cm above the carina. The esophagogastric tube tip and proximal port project below the GE junction. The right internal jugular venous catheter tip projects over the midportion of the SVC. There is calcification in the wall of the aortic arch. IMPRESSION: Increase conspicuity of posterior layering pleural effusions bilaterally greatest on the right. No definite pulmonary edema. Thoracic aortic atherosclerosis. The support tubes are in reasonable position. Electronically Signed   By: David  Martinique M.D.   On: 07/22/2017 08:39     STUDIES:  CXR 11/11 > Endotracheal tube 5.7 cm from carina. Enteric tube tip below the field of view in the abdomen. Transcutaneous pacing pads noted. Stable cardiac silhouette. Aortic atherosclerosis. Clear lungs. No acute osseous abnormality is evident ABD 11/11 > Nonobstructive bowel gas pattern. Enteric tube with tip over the stomach and side-port at the region of the gastroesophageal junction. This could be advanced approximately 8 cm. CTA Chest 11/11 > 2.9 cm right upper lobe cavitary spiculated nodule and 3.7 cm right lower lobe mass. Findings probably represent synchronous or primary/metastatic lung cancer. Distant metastatic disease is less likely. The right upper lobe lesion invades the hilum. There is a satellite nodule inferior to the right lower lobe lesion. Sub- carinal lymphadenopathy. 2. No aortic dissection. 3. Left common carotid artery origin fibrofatty plaque with moderate 50% stenosis. 4. Cardiomegaly and severe coronary  artery calcification.  CT A/P 11/11 > 1. Diffuse gallbladder wall thickening and extensive inflammation surrounding the pancreas. Inflammation is likely due to acute pancreatitis with either reactive changes of the gallbladder or concurrent acute cholecystitis. 2. No aortic dissection. 3. Infrarenal abdominal aortic repair with patent bilateral aortofemoral bypass. Extensive atherosclerotic disease with severe proximal femoral artery stenosis, severe mid SMA stenosis, severe bilateral renal artery stenosis. 4. Layering density within the gallbladder may represent small stones or vicarious excretion of contrast. 5. Small volume of ascites.  [place] 11/13>>>Three liver masses measuring up to 3.2 cm, favor metastatic disease given chest CT findings yesterday. 2. Cholelithiasis. No visible choledocholithiasis or common bile duct dilatation. 3. Prominent gallbladder wall edema. This could be reactive rather than cholecystitis given the gallbladder is not over dilated  CULTURES: U/A 11/11 > Negative  Blood Culture 11/11 >> Sputum 11/11 >>  ANTIBIOTICS: Vancomycin  11/11>>>11/12,11/13>>>11/14 Zosyn 11/11>>>1/15 Ctx 1/15>>>  SIGNIFICANT EVENTS: 11/11 > Presents to ED  1/12- shock, brady, vagal  LINES/TUBES: ETT 11/11 >> Right IJ 11/11 >>  Right Femoral 11/11 >>  DISCUSSION: 66 year old male presents to ED hypoxic, hypotensive, and bradycardiac. Intubated in ED. Cardiology consulted.   ASSESSMENT / PLAN:  PULMONARY A: Acute Hypoxic Respiratory Failure  CT Chest with 2.9 cm right upper lobe cavitary spiculated nodule and 3.7 cm right lower lobe mass highly suspicious for malignancy  With mets H/O Tobacco Use  Increased edema 11/15 P:   Bronch assessment for cancer this am , RUL, RLL planned Wean cpap 5 ps5, goal 2 hours Post bronch Would prefer him to go to cath with ETT given these episodes he has with change in MS associated with brady shock May need lasix, reserve for now with  cath soon  CARDIOVASCULAR A:  Septic vs Cardiogenic Shock, acidosis related likely  CAD s/p RCA stent 2013, AAA repair 2016 H/O HTN, HLD  CT Chest with cardiomegaly and severe coronary artery calcification.  Rule out ischemia, cath at some point per cards Trop elevated P:  dopamine to MAP 65 , too ff if able, at 4 mics now Asa Heparin drip to off with bronch  RENAL A:   ATN likely, slight better daily P:   kvo Chem in am  Lasix in future ,not pre cath kvo Dc any further bolus Consider bicarb pre cath, per interv cards  GASTROINTESTINAL A:   Transaminase elevation- resolving, shock liver, MI contribtuion Met liver , primary lung? [place] neg gallbaldder P:   restartTF post bronch and hold for cath Assess aFP  - wnl  HEMATOLOGIC A:   VTE Prop  Rule out ischemia anemia P:  Trend CBC in am   SCDs  Hep hold for bronch  INFECTIOUS A:   NO PNA P:   Fever noted I am unconvinced of infection source, pct not impressed Narrow to ctx  ENDOCRINE A:   Hyperglycemia  Now bordwerline low HIGH cortisol response-poor outcome>? P:   cbg  NEUROLOGIC A:   Acute Metabolic Encephalopathy  - resolved H/O ETOH ETOH 155  Not tolerate benzo well , liit as able P:   RASS goal: 0/-1 If needed add back fent Folic Acid/Thiamine - ensure IV Lactulose keep as daily  FAMILY  - Updates: ci udpated family 11.14 in full in rom, wife, son  - Inter-disciplinary family meet or Palliative Care meeting due BA:QVOH by df 11/14   Ccm time 35 min   Lavon Paganini. Titus Mould, MD, [place] Pgr: [facility]

## 2017-07-22 NOTE — Progress Notes (Signed)
Nutrition Follow-up  DOCUMENTATION CODES:   Non-severe (moderate) malnutrition in context of chronic illness  INTERVENTION:   Tube Feeding:   Continue Vital AF 1.2 @ 65 ml/hr via OG tube.    NUTRITION DIAGNOSIS:   Moderate Malnutrition related to chronic illness(lung mass highly suspicious for malignancy, hx of EtOH abuse) as evidenced by moderate fat depletion, mild muscle depletion. Being addressed via TF  GOAL:   Provide needs based on ASPEN/SCCM guidelines  Met  MONITOR:   TF tolerance, Vent status, Labs, Weight trends  REASON FOR ASSESSMENT:   Consult, Ventilator Enteral/tube feeding initiation and management  ASSESSMENT:   66 yo male admitted after being found unresponsive with acute respiratory failure requiring intubation (CT chest with 2.9 cm RUL cavitary spiculated nodule and 3.7 cm RLL mass highly suspicious for malignancy, septic vs cardiogenic shock. Pt with hx of CAD s/p RCA stent in 2013, AAA s/p open repair in 2016, HTN, HLD, EtOH abuse  11/15 Bronch today, assessment for malignancy  Tolerating Vital AF 1.2 @ goal of 65 ml/hr via OG tube  Weight relatively stable, up a few pounds since admission.   Patient is currently intubated on ventilator support MV: 8.4 L/min Temp (24hrs), Avg:98.9 F (37.2 C), Min:97.5 F (36.4 C), Max:101.5 F (38.6 C)  Labs: sodium 134 Meds: lactulose, fentanyl, versed, dopamine  Diet Order:  Diet NPO time specified  EDUCATION NEEDS:   Not appropriate for education at this time  Skin:  Skin Assessment: Reviewed RN Assessment(no pressure ulcers documented)  Last BM:  11/14  Height:   Ht Readings from Last 1 Encounters:  07/24/2017 _0  (1.778 m)    Weight:   Wt Readings from Last 1 Encounters:  07/22/17 160 lb 4.4 oz (72.7 kg)    Ideal Body Weight:     BMI:  Body mass index is 23 kg/m.  Estimated Nutritional Needs:   Kcal:  1952 kcals  Protein:  107-142 g  Fluid:  >/= 2 L   Kerman Passey MS,  RD, LDN, CNSC [phone] Pager  [phone] Weekend/On-Call Pager

## 2017-07-22 NOTE — Procedures (Deleted)
Name:  Adam Kiser Sr. MRN:  [id number] DOB:  Apr 29, 1951  OPERATIVE NOTE  Procedure:  Percutaneous tracheostomy.  Indications:  Ventilator-dependent respiratory failure.  Consent:  Procedure, alternatives, risks and benefits discussed with medical POA.  Questions answered.  Consent obtained.  Anesthesia:  Vec, prop, fent, versed  Procedure summary:  Appropriate equipment was assembled.  The patient was identified as Adam Smiling Sr. and safety timeout was performed. The patient was placed in supine position with a towel roll behind shoulder blades and neck extended.  Sterile technique was used. The patient's neck and upper chest were prepped using chlorhexidine / alcohol scrub and the field was draped in usual sterile fashion with full body drape. After the adequate sedation / anesthesia was achieved, attention was directed at the midline trachea, where the cricothyroid membrane was palpated. Approximately two fingerbreadths above the sternal notch, a verticle incision was created with a scalpel after local infiltration with 0.2% Lidocaine. Then, using Seldinger technique and a percutaneous tracheostomy set, the trachea was entered with a 14 gauge needle with an overlying sheath. This was all confirmed under direct visualization of a fiberoptic flexible bronchoscope. Entrance into the trachea was identified through the third tracheal ring interspace. Following this, a guidewire was inserted. The needle was removed, leaving the sheath and the guidewire intact. Next, the sheath was removed and a small dilator was inserted. The tracheal rings were then dilated. A #8 Shiley was then opened. The balloon was checked. It was placed over a tracheal dilator, which was then advanced over the guidewire and through the previously dilated tract. The Shiley tracheostomy tube was noted to pass in the trachea with little resistance. The guidewire and dilator tubes were removed from the trachea. An  inner cannula was placed through the tracheostomy tube. The tracheostomy was then secured at the anterior neck with 4 monofilament sutures. The oral endotracheal tube was removed and the ventilator was attached to the newly placed tracheostomy tube. Adequate tidal volumes were noted. The cuff was inflated and no evidence of air leak was noted. No evidence of bleeding was noted. At this point, the procedure was concluded. Post-procedure chest x-ray was ordered.  Complications:  No immediate complications were noted.  Hemodynamic parameters and oxygenation remained stable throughout the procedure.  Estimated blood loss:  Less then 15 mL.  Raylene Miyamoto., MD Pulmonary and [place] Pager: [phone]  07/22/2017, 10:46 AM  Should follow up with [facility] [phone]

## 2017-07-22 NOTE — Progress Notes (Signed)
[facility]link was notified about patient's increased agitation,  temperature and seizure like activity. An order for a Versed drip was placed by MD. Will continue to monitor.

## 2017-07-22 NOTE — Progress Notes (Signed)
Progress Note  Patient Name: Adam Mezera Sr. Date of Encounter: 07/22/2017  Primary Cardiologist:   Dr. Jovita Gamma  Subjective   He is awake and alert and denies any chest pain.    He had some episodes of "seizure like" activity noted in nursing notes early this AM.  One episode of bradycardia and hypotension.  However, pressors have been weaned and he tolerated vent weaning for several hours yesterday.    Inpatient Medications    Scheduled Meds: . aspirin  81 mg Oral Daily  . folic acid  1 mg Intravenous Daily  . ipratropium-albuterol  3 mL Nebulization Q6H  . lactulose  30 g Oral Daily  . mouth rinse  15 mL Mouth Rinse Q2H  . ondansetron (ZOFRAN) IV  4 mg Intravenous Once  . pantoprazole sodium  40 mg Per Tube QHS  . sodium chloride flush  10-40 mL Intracatheter Q12H  . thiamine injection  100 mg Intravenous Daily   Continuous Infusions: . sodium chloride 10 mL/hr at 07/22/17 0608  . sodium chloride Stopped (07/21/17 1607)  . sodium chloride 75 mL/hr at 07/21/17 1607  . DOPamine 4 mcg/kg/min (07/21/17 1405)  . feeding supplement (VITAL AF 1.2 CAL) Stopped (07/22/17 0000)  . fentaNYL infusion INTRAVENOUS 150 mcg/hr (07/22/17 7564)  . heparin 1,050 Units/hr (07/22/17 3329)  . midazolam (VERSED) infusion 1 mg/hr (07/22/17 0215)  . norepinephrine (LEVOPHED) Adult infusion Stopped (07/22/17 0250)  . piperacillin-tazobactam (ZOSYN)  IV 3.375 g (07/22/17 0604)  . vasopressin (PITRESSIN) infusion - *FOR SHOCK* Stopped (07/19/17 1830)   PRN Meds: sodium chloride, atropine, fentaNYL, ibuprofen, midazolam, sodium chloride flush   Vital Signs    Vitals:   07/22/17 0455 07/22/17 0500 07/22/17 0600 07/22/17 0700  BP:  116/75 133/83 140/88  Pulse:  87 84 83  Resp:  14 14 14   Temp:  98.1 F (36.7 C) 97.7 F (36.5 C) (!) 97.5 F (36.4 C)  TempSrc:      SpO2:  100% 100% 100%  Weight: 160 lb 4.4 oz (72.7 kg)     Height:        Intake/Output Summary (Last 24  hours) at 07/22/2017 0748 Last data filed at 07/22/2017 0700 Gross per 24 hour  Intake 3258.38 ml  Output 800 ml  Net 2458.38 ml   Filed Weights   07/20/17 0356 07/21/17 0500 07/22/17 0455  Weight: 161 lb 9.6 oz (73.3 kg) 160 lb 7.9 oz (72.8 kg) 160 lb 4.4 oz (72.7 kg)    Telemetry    NSR, no sustained pauses.   ECG    NA  Physical Exam   GEN: No  acute distress.   Neck: No  JVD Cardiac: RRR, no murmurs, rubs, or gallops.  Respiratory: Clear   to auscultation bilaterally. GI: Decreased bowel sounds and distended but not tender MS:  Diffuse extremity edema; No deformity. Neuro:   Nonfocal  Psych:  Intubated.    Labs    Chemistry Recent Labs  Lab 08/01/2017 1848  07/20/17 0313 07/21/17 0500 07/22/17 0430  NA 127*   < > 130* 132* 134*  K 4.7   < > 3.7 3.8 3.7  CL 98*   < > 100* 101 103  CO2 19*   < > 22 24 25   GLUCOSE 100*   < > 108* 79 99  BUN 13   < > 21* 19 18  CREATININE 1.39*   < > 1.96* 1.63* 1.59*  CALCIUM 8.1*   < > 7.1* 7.3*  7.4*  PROT 6.3*  --  5.0* 5.1*  --   ALBUMIN 2.8*  --  2.3* 2.3*  --   AST 86*  --  4,288* 1,616*  --   ALT 38  --  2,117* 1,413*  --   ALKPHOS 123  --  168* 163*  --   BILITOT 0.7  --  1.1 1.8*  --   GFRNONAA 51*   < > 34* 42* 44*  GFRAA 59*   < > 39* 49* 51*  ANIONGAP 10   < > 8 7 6    < > = values in this interval not displayed.     Hematology Recent Labs  Lab 07/20/17 0313 07/21/17 0500 07/22/17 0430  WBC 9.4 8.1 7.6  RBC 3.26* 3.30* 3.12*  HGB 10.3* 10.3* 9.6*  HCT 30.8* 31.5* 30.1*  MCV 94.5 95.5 96.5  MCH 31.6 31.2 30.8  MCHC 33.4 32.7 31.9  RDW 13.3 13.2 13.3  PLT 159 151 147*    Cardiac Enzymes Recent Labs  Lab 07/19/17 2145 07/20/17 0313 07/20/17 0900 07/20/17 1640  TROPONINI 30.66* 27.43* 29.86* 24.97*    Recent Labs  Lab 07/29/2017 1906 07/08/2017 2121  TROPIPOC 0.25* 0.29*     BNPNo results for input(s): BNP, PROBNP in the last 168 hours.   DDimer  Recent Labs  Lab 07/20/17 0313    DDIMER >20.00*     Radiology    Dg Chest Port 1 View  Result Date: 07/21/2017 CLINICAL DATA:  Endotracheal tube, acute respiratory failure. EXAM: PORTABLE CHEST 1 VIEW COMPARISON:  07/20/2017 and CT chest 07/11/2017. FINDINGS: Endotracheal tube terminates 6.5 cm above the carina. Nasogastric tube terminates just beyond the gastroesophageal junction. Defibrillator pads project over the left hemithorax. Heart size is grossly stable. Thoracic aorta is calcified. Cavitary lesion in the medial aspect of the right upper lobe and an irregular area of nodular consolidation in the right lower lobe are poorly visualized. Small right pleural effusion. Findings appear unchanged from yesterday. IMPRESSION: 1. Small right pleural effusion. 2. Known irregular nodular lesions in the right upper and right lower lobes are obscured. 3.  Aortic atherosclerosis (ICD10-170.0). Electronically Signed   By: Lorin Picket M.D.   On: 07/21/2017 08:00    Cardiac Studies   ECHO:   Study Conclusions  - Left ventricle: The cavity size was normal. There was moderate   concentric hypertrophy. Systolic function was normal. The   estimated ejection fraction was in the range of 60% to 65%. Wall   motion was normal; there were no regional wall motion   abnormalities. - Mitral valve: Calcified annulus. - Right ventricle: Systolic function was moderately reduced. - Tricuspid valve: There was mild regurgitation. - Pericardium, extracardiac: The pericardium appears thickened at   the apex as well as the apical RV wall with small pericardial   effusion over the apex. Suggest cardiac MRI to evaluate   pericardium further.   Patient Profile     66 y.o. male with PMH CAD s/p RCA stent 2013, AAA s/p open repair 2016 with aortobifemoral bypass grafts, HTN, dyslipidemia seen in consult at the request of Dr. Tomi Bamberger for bradycardia, hypotension, and syncope.  Assessment & Plan    NSTEMI:   Plan elective cardiac cath to  evaluate NSTEMI.  Possibly tomorrow.  Renal function is still marginal with soft urine output.   BRADYCARDIA:  I reviewed telemetry and he had one episode yesterday afternoon of intermittent bradycardia.  However, this resolved and this appeared to be sinus  brady with junctional escape.  No events since yesterday afternoon.   No indication for pacing at this point.   AKI:  Creat up continuing to trend down.  UO has been   LUNG MASS:  Cavitary.  Likely newly diagnosed lung cancer with mets to liver.   Bronch possibly today.   ELEVATED LIVER ENZYMES:   Trending down.   ABNORMAL PERICARDIUM:   Apical thickening noted. This could represent malignancy given the lung and liver lesions.  MRI would be needed to further image this but not indicated at this time.  Effusion is not hemodynamically significant.    For questions or updates, please contact [place] Please consult [email or web address] for contact info under Cardiology/STEMI.   Signed, Minus Breeding, MD  07/22/2017, 7:48 AM

## 2017-07-23 ENCOUNTER — Other Ambulatory Visit: Payer: Self-pay

## 2017-07-23 ENCOUNTER — Encounter (HOSPITAL_COMMUNITY): Payer: Self-pay | Admitting: Interventional Cardiology

## 2017-07-23 ENCOUNTER — Inpatient Hospital Stay (HOSPITAL_COMMUNITY): Payer: Medicare HMO

## 2017-07-23 ENCOUNTER — Ambulatory Visit (HOSPITAL_COMMUNITY)
Admission: RE | Admit: 2017-07-23 | Payer: Medicare HMO | Source: Ambulatory Visit | Admitting: Interventional Cardiology

## 2017-07-23 ENCOUNTER — Encounter (HOSPITAL_COMMUNITY): Admission: EM | Disposition: E | Payer: Self-pay | Source: Home / Self Care | Attending: Internal Medicine

## 2017-07-23 DIAGNOSIS — G9340 Encephalopathy, unspecified: Secondary | ICD-10-CM

## 2017-07-23 DIAGNOSIS — R570 Cardiogenic shock: Secondary | ICD-10-CM

## 2017-07-23 DIAGNOSIS — K7200 Acute and subacute hepatic failure without coma: Secondary | ICD-10-CM

## 2017-07-23 HISTORY — PX: ULTRASOUND GUIDANCE FOR VASCULAR ACCESS: SHX6516

## 2017-07-23 HISTORY — PX: LEFT HEART CATH AND CORONARY ANGIOGRAPHY: CATH118249

## 2017-07-23 LAB — POCT I-STAT 3, ART BLOOD GAS (G3+)
ACID-BASE DEFICIT: 4 mmol/L — AB (ref 0.0–2.0)
ACID-BASE DEFICIT: 3 mmol/L — AB (ref 0.0–2.0)
Bicarbonate: 22.3 mmol/L (ref 20.0–28.0)
Bicarbonate: 22.9 mmol/L (ref 20.0–28.0)
O2 SAT: 100 %
O2 SAT: 100 %
PH ART: 7.332 — AB (ref 7.350–7.450)
TCO2: 24 mmol/L (ref 22–32)
TCO2: 24 mmol/L (ref 22–32)
pCO2 arterial: 42.7 mmHg (ref 32.0–48.0)
pCO2 arterial: 43.2 mmHg (ref 32.0–48.0)
pH, Arterial: 7.326 — ABNORMAL LOW (ref 7.350–7.450)
pO2, Arterial: 296 mmHg — ABNORMAL HIGH (ref 83.0–108.0)
pO2, Arterial: 358 mmHg — ABNORMAL HIGH (ref 83.0–108.0)

## 2017-07-23 LAB — CBC
HCT: 31.3 % — ABNORMAL LOW (ref 39.0–52.0)
HCT: 32.6 % — ABNORMAL LOW (ref 39.0–52.0)
HEMATOCRIT: 33.2 % — AB (ref 39.0–52.0)
HEMOGLOBIN: 10.6 g/dL — AB (ref 13.0–17.0)
Hemoglobin: 9.8 g/dL — ABNORMAL LOW (ref 13.0–17.0)
Hemoglobin: 10.2 g/dL — ABNORMAL LOW (ref 13.0–17.0)
MCH: 30.7 pg (ref 26.0–34.0)
MCH: 31.3 pg (ref 26.0–34.0)
MCH: 31.4 pg (ref 26.0–34.0)
MCHC: 31.9 g/dL (ref 30.0–36.0)
MCHC: 31.3 g/dL (ref 30.0–36.0)
MCHC: 31.3 g/dL (ref 30.0–36.0)
MCV: 100 fL (ref 78.0–100.0)
MCV: 98.1 fL (ref 78.0–100.0)
MCV: 98.2 fL (ref 78.0–100.0)
PLATELETS: 205 10*3/uL (ref 150–400)
PLATELETS: 215 10*3/uL (ref 150–400)
Platelets: 201 10*3/uL (ref 150–400)
RBC: 3.19 MIL/uL — ABNORMAL LOW (ref 4.22–5.81)
RBC: 3.26 MIL/uL — AB (ref 4.22–5.81)
RBC: 3.38 MIL/uL — AB (ref 4.22–5.81)
RDW: 13.6 % (ref 11.5–15.5)
RDW: 14 % (ref 11.5–15.5)
RDW: 13.7 % (ref 11.5–15.5)
WBC: 11.7 10*3/uL — ABNORMAL HIGH (ref 4.0–10.5)
WBC: 10.8 10*3/uL — AB (ref 4.0–10.5)
WBC: 10.2 10*3/uL (ref 4.0–10.5)

## 2017-07-23 LAB — HEPARIN LEVEL (UNFRACTIONATED): Heparin Unfractionated: 0.13 IU/mL — ABNORMAL LOW (ref 0.30–0.70)

## 2017-07-23 LAB — BASIC METABOLIC PANEL
ANION GAP: 9 (ref 5–15)
Anion gap: 6 (ref 5–15)
Anion gap: 13 (ref 5–15)
BUN: 15 mg/dL (ref 6–20)
BUN: 22 mg/dL — AB (ref 6–20)
BUN: 27 mg/dL — AB (ref 6–20)
CALCIUM: 7.5 mg/dL — AB (ref 8.9–10.3)
CHLORIDE: 101 mmol/L (ref 101–111)
CO2: 24 mmol/L (ref 22–32)
CO2: 21 mmol/L — ABNORMAL LOW (ref 22–32)
CO2: 19 mmol/L — AB (ref 22–32)
CREATININE: 2.09 mg/dL — AB (ref 0.61–1.24)
Calcium: 7.8 mg/dL — ABNORMAL LOW (ref 8.9–10.3)
Calcium: 8.2 mg/dL — ABNORMAL LOW (ref 8.9–10.3)
Chloride: 105 mmol/L (ref 101–111)
Chloride: 103 mmol/L (ref 101–111)
Creatinine, Ser: 1.51 mg/dL — ABNORMAL HIGH (ref 0.61–1.24)
Creatinine, Ser: 1.82 mg/dL — ABNORMAL HIGH (ref 0.61–1.24)
GFR calc Af Amer: 54 mL/min — ABNORMAL LOW (ref >60)
GFR calc Af Amer: 43 mL/min — ABNORMAL LOW (ref >60)
GFR calc non Af Amer: 46 mL/min — ABNORMAL LOW (ref >60)
GFR calc non Af Amer: 31 mL/min — ABNORMAL LOW (ref >60)
GFR, EST AFRICAN AMERICAN: 36 mL/min — AB (ref >60)
GFR, EST NON AFRICAN AMERICAN: 37 mL/min — AB (ref >60)
GLUCOSE: 120 mg/dL — AB (ref 65–99)
GLUCOSE: 146 mg/dL — AB (ref 65–99)
Glucose, Bld: 114 mg/dL — ABNORMAL HIGH (ref 65–99)
POTASSIUM: 4 mmol/L (ref 3.5–5.1)
Potassium: 3.6 mmol/L (ref 3.5–5.1)
Potassium: 4.2 mmol/L (ref 3.5–5.1)
Sodium: 135 mmol/L (ref 135–145)
Sodium: 133 mmol/L — ABNORMAL LOW (ref 135–145)
Sodium: 133 mmol/L — ABNORMAL LOW (ref 135–145)

## 2017-07-23 LAB — CULTURE, BLOOD (ROUTINE X 2)
CULTURE: NO GROWTH
Culture: NO GROWTH
SPECIAL REQUESTS: ADEQUATE
SPECIAL REQUESTS: ADEQUATE

## 2017-07-23 LAB — TROPONIN I: Troponin I: 4.04 ng/mL (ref <0.03)

## 2017-07-23 LAB — PHOSPHORUS: Phosphorus: 2.7 mg/dL (ref 2.5–4.6)

## 2017-07-23 LAB — BRAIN NATRIURETIC PEPTIDE: B Natriuretic Peptide: 4018.6 pg/mL — ABNORMAL HIGH (ref 0.0–100.0)

## 2017-07-23 LAB — GLUCOSE, CAPILLARY
GLUCOSE-CAPILLARY: 115 mg/dL — AB (ref 65–99)
GLUCOSE-CAPILLARY: 118 mg/dL — AB (ref 65–99)
Glucose-Capillary: 108 mg/dL — ABNORMAL HIGH (ref 65–99)
Glucose-Capillary: 135 mg/dL — ABNORMAL HIGH (ref 65–99)
Glucose-Capillary: 139 mg/dL — ABNORMAL HIGH (ref 65–99)
Glucose-Capillary: 75 mg/dL (ref 65–99)

## 2017-07-23 LAB — CREATININE, SERUM
CREATININE: 2.01 mg/dL — AB (ref 0.61–1.24)
GFR calc Af Amer: 38 mL/min — ABNORMAL LOW (ref >60)
GFR calc non Af Amer: 33 mL/min — ABNORMAL LOW (ref >60)

## 2017-07-23 LAB — MAGNESIUM: Magnesium: 1.6 mg/dL — ABNORMAL LOW (ref 1.7–2.4)

## 2017-07-23 LAB — POCT ACTIVATED CLOTTING TIME: ACTIVATED CLOTTING TIME: 136 s

## 2017-07-23 SURGERY — LEFT HEART CATH AND CORONARY ANGIOGRAPHY
Anesthesia: LOCAL

## 2017-07-23 MED ORDER — FENTANYL CITRATE (PF) 100 MCG/2ML IJ SOLN
INTRAMUSCULAR | Status: AC
  Filled 2017-07-23: qty 2

## 2017-07-23 MED ORDER — MIDAZOLAM HCL 2 MG/2ML IJ SOLN
INTRAMUSCULAR | Status: AC
  Filled 2017-07-23: qty 2

## 2017-07-23 MED ORDER — MIDAZOLAM HCL 2 MG/2ML IJ SOLN
4.0000 mg | Freq: Once | INTRAMUSCULAR | Status: AC
  Administered 2017-07-23: 4 mg via INTRAVENOUS

## 2017-07-23 MED ORDER — ETOMIDATE 2 MG/ML IV SOLN
20.0000 mg | Freq: Once | INTRAVENOUS | Status: AC
  Administered 2017-07-23: 20 mg via INTRAVENOUS

## 2017-07-23 MED ORDER — MIDAZOLAM HCL 2 MG/2ML IJ SOLN
INTRAMUSCULAR | Status: AC
  Administered 2017-07-23: 4 mg via INTRAVENOUS
  Filled 2017-07-23: qty 2

## 2017-07-23 MED ORDER — HEPARIN (PORCINE) IN NACL 100-0.45 UNIT/ML-% IJ SOLN: 10.0000 [IU]/kg/h | INTRAMUSCULAR | Status: DC

## 2017-07-23 MED ORDER — MAGNESIUM SULFATE 2 GM/50ML IV SOLN
2.0000 g | Freq: Once | INTRAVENOUS | Status: AC
  Administered 2017-07-23: 2 g via INTRAVENOUS

## 2017-07-23 MED ORDER — RACEPINEPHRINE HCL 2.25 % IN NEBU: 0.5000 mL | INHALATION_SOLUTION | Freq: Once | RESPIRATORY_TRACT | Status: DC

## 2017-07-23 MED ORDER — RACEPINEPHRINE HCL 2.25 % IN NEBU
INHALATION_SOLUTION | RESPIRATORY_TRACT | Status: AC
  Administered 2017-07-23: 0.5 mL
  Filled 2017-07-23: qty 0.5

## 2017-07-23 MED ORDER — MIDAZOLAM HCL 2 MG/2ML IJ SOLN: 1.0000 mg | INTRAMUSCULAR | Status: DC | PRN

## 2017-07-23 MED ORDER — RACEPINEPHRINE HCL 2.25 % IN NEBU
0.5000 mL | INHALATION_SOLUTION | Freq: Once | RESPIRATORY_TRACT | Status: AC
  Administered 2017-07-23: 0.5 mL via RESPIRATORY_TRACT

## 2017-07-23 MED ORDER — HEPARIN (PORCINE) IN NACL 2-0.9 UNIT/ML-% IJ SOLN
INTRAMUSCULAR | Status: AC
  Filled 2017-07-23: qty 1000

## 2017-07-23 MED ORDER — SODIUM CHLORIDE 0.9 % WEIGHT BASED INFUSION: 3.0000 mL/kg/h | INTRAVENOUS | Status: DC

## 2017-07-23 MED ORDER — SODIUM CHLORIDE 0.9 % IV SOLN: 250.0000 mL | INTRAVENOUS | Status: DC | PRN

## 2017-07-23 MED ORDER — LIDOCAINE HCL (PF) 1 % IJ SOLN
INTRAMUSCULAR | Status: AC
  Filled 2017-07-23: qty 30

## 2017-07-23 MED ORDER — SODIUM CHLORIDE 0.9% FLUSH: 3.0000 mL | Freq: Two times a day (BID) | INTRAVENOUS | Status: DC

## 2017-07-23 MED ORDER — FUROSEMIDE 10 MG/ML IJ SOLN
40.0000 mg | Freq: Once | INTRAMUSCULAR | Status: AC
  Administered 2017-07-23: 40 mg via INTRAVENOUS
  Filled 2017-07-23: qty 4

## 2017-07-23 MED ORDER — LIDOCAINE HCL (PF) 1 % IJ SOLN
INTRAMUSCULAR | Status: DC | PRN
  Administered 2017-07-23: 16 mL

## 2017-07-23 MED ORDER — METHYLPREDNISOLONE SODIUM SUCC 40 MG IJ SOLR
40.0000 mg | Freq: Four times a day (QID) | INTRAMUSCULAR | Status: AC
  Administered 2017-07-23 – 2017-07-25 (×7): 40 mg via INTRAVENOUS
  Filled 2017-07-23 (×7): qty 1

## 2017-07-23 MED ORDER — SODIUM CHLORIDE 0.9% FLUSH
3.0000 mL | INTRAVENOUS | Status: DC | PRN
Start: 2017-07-23 — End: 2017-07-24

## 2017-07-23 MED ORDER — SODIUM CHLORIDE 0.9% FLUSH
3.0000 mL | Freq: Two times a day (BID) | INTRAVENOUS | Status: DC
  Administered 2017-07-23 (×2): 3 mL via INTRAVENOUS

## 2017-07-23 MED ORDER — HEPARIN (PORCINE) IN NACL 2-0.9 UNIT/ML-% IJ SOLN
INTRAMUSCULAR | Status: AC | PRN
  Administered 2017-07-23: 1000 mL

## 2017-07-23 MED ORDER — FENTANYL CITRATE (PF) 100 MCG/2ML IJ SOLN
50.0000 ug | Freq: Once | INTRAMUSCULAR | Status: AC
  Administered 2017-07-23: 50 ug via INTRAVENOUS

## 2017-07-23 MED ORDER — MIDAZOLAM HCL 2 MG/2ML IJ SOLN
1.0000 mg | INTRAMUSCULAR | Status: DC | PRN
  Filled 2017-07-23: qty 2

## 2017-07-23 MED ORDER — SODIUM CHLORIDE 0.9 % IV SOLN: INTRAVENOUS | Status: DC

## 2017-07-23 MED ORDER — RACEPINEPHRINE HCL 2.25 % IN NEBU
INHALATION_SOLUTION | RESPIRATORY_TRACT | Status: AC
  Filled 2017-07-23: qty 0.5

## 2017-07-23 MED ORDER — HEPARIN (PORCINE) IN NACL 100-0.45 UNIT/ML-% IJ SOLN
1200.0000 [IU]/h | INTRAMUSCULAR | Status: DC
  Administered 2017-07-23 – 2017-07-24 (×2): 1300 [IU]/h via INTRAVENOUS
  Administered 2017-07-25 – 2017-07-27 (×3): 1200 [IU]/h via INTRAVENOUS
  Filled 2017-07-23 (×5): qty 250

## 2017-07-23 MED ORDER — IOPAMIDOL (ISOVUE-370) INJECTION 76%
INTRAVENOUS | Status: AC
  Filled 2017-07-23: qty 100

## 2017-07-23 MED ORDER — FENTANYL CITRATE (PF) 100 MCG/2ML IJ SOLN
INTRAMUSCULAR | Status: AC
  Filled 2017-07-23: qty 4

## 2017-07-23 MED ORDER — SODIUM CHLORIDE 0.9 % WEIGHT BASED INFUSION: 1.0000 mL/kg/h | INTRAVENOUS | Status: DC

## 2017-07-23 MED ORDER — MIDAZOLAM HCL 2 MG/2ML IJ SOLN
INTRAMUSCULAR | Status: DC | PRN
  Administered 2017-07-23: 1 mg via INTRAVENOUS

## 2017-07-23 MED ORDER — FUROSEMIDE 10 MG/ML IJ SOLN: 20.0000 mg | Freq: Once | INTRAMUSCULAR | Status: DC

## 2017-07-23 MED ORDER — FENTANYL CITRATE (PF) 100 MCG/2ML IJ SOLN
INTRAMUSCULAR | Status: DC | PRN
  Administered 2017-07-23: 25 ug via INTRAVENOUS

## 2017-07-23 MED ORDER — MIDAZOLAM HCL 2 MG/2ML IJ SOLN
INTRAMUSCULAR | Status: AC
Start: 2017-07-23 — End: 2017-07-24
  Filled 2017-07-23: qty 2

## 2017-07-23 MED ORDER — MAGNESIUM SULFATE 2 GM/50ML IV SOLN
INTRAVENOUS | Status: AC
  Filled 2017-07-23: qty 50

## 2017-07-23 MED ORDER — HEPARIN SODIUM (PORCINE) 5000 UNIT/ML IJ SOLN: 5000.0000 [IU] | Freq: Three times a day (TID) | INTRAMUSCULAR | Status: DC

## 2017-07-23 MED ORDER — IPRATROPIUM-ALBUTEROL 0.5-2.5 (3) MG/3ML IN SOLN
3.0000 mL | RESPIRATORY_TRACT | Status: DC | PRN
  Administered 2017-07-23: 3 mL via RESPIRATORY_TRACT
  Filled 2017-07-23 (×2): qty 3

## 2017-07-23 MED ORDER — HALOPERIDOL LACTATE 5 MG/ML IJ SOLN: 2.5000 mg | Freq: Four times a day (QID) | INTRAMUSCULAR | Status: DC | PRN

## 2017-07-23 MED ORDER — MIDAZOLAM HCL 2 MG/2ML IJ SOLN
1.0000 mg | INTRAMUSCULAR | Status: DC | PRN
  Administered 2017-07-24 (×2): 1 mg via INTRAVENOUS
  Filled 2017-07-23: qty 2

## 2017-07-23 MED ORDER — IOPAMIDOL (ISOVUE-370) INJECTION 76%
INTRAVENOUS | Status: DC | PRN
  Administered 2017-07-23: 30 mL via INTRA_ARTERIAL

## 2017-07-23 MED ORDER — FENTANYL CITRATE (PF) 100 MCG/2ML IJ SOLN
INTRAMUSCULAR | Status: AC
  Administered 2017-07-23: 50 ug via INTRAVENOUS
  Filled 2017-07-23: qty 2

## 2017-07-23 MED ORDER — SODIUM CHLORIDE 0.9 % IV SOLN: INTRAVENOUS | Status: DC | PRN

## 2017-07-23 MED ORDER — SODIUM CHLORIDE 0.9% FLUSH: 3.0000 mL | INTRAVENOUS | Status: DC | PRN

## 2017-07-23 SURGICAL SUPPLY — 9 items
CATH INFINITI MULTIPACK ST 5F (CATHETERS) ×3 IMPLANT
COVER PRB 48X5XTLSCP FOLD TPE (BAG) ×2 IMPLANT
COVER PROBE 5X48 (BAG) ×1
HOVERMATT SINGLE USE (MISCELLANEOUS) ×3 IMPLANT
KIT HEART LEFT (KITS) ×3 IMPLANT
PACK CARDIAC CATHETERIZATION (CUSTOM PROCEDURE TRAY) ×3 IMPLANT
SHEATH PINNACLE 5F 10CM (SHEATH) ×3 IMPLANT
TRANSDUCER W/STOPCOCK (MISCELLANEOUS) ×3 IMPLANT
WIRE EMERALD 3MM-J .035X150CM (WIRE) ×3 IMPLANT

## 2017-07-23 NOTE — Progress Notes (Signed)
[place] for Heparin Indication: chest pain/ACS  Allergies  Allergen Reactions  . Chlorhexidine Itching and Rash  . Codeine Itching and Rash    Patient Measurements: Height: 5\' 10"  (177.8 cm) Weight: 167 lb 15.9 oz (76.2 kg) IBW/kg (Calculated) : 73  Vital Signs: Temp: 100.6 F (38.1 C) (11/16 0735) BP: 146/71 (11/16 0735) Pulse Rate: 104 (11/16 0735)  Labs: Recent Labs    07/20/17 0900 07/20/17 1130 07/20/17 1640  07/21/17 0500 07/21/17 1045 07/22/17 0430 07/19/2017 0053 07/26/2017 0640  HGB  --   --   --    < > 10.3*  --  9.6* 10.6* 9.8*  HCT  --   --   --    < > 31.5*  --  30.1* 33.2* 31.3*  PLT  --   --   --    < > 151  --  147* 201 205  APTT  --   --   --   --   --  88*  --   --   --   LABPROT  --   --   --   --   --  14.8  --   --   --   INR  --   --   --   --   --  1.17  --   --   --   HEPARINUNFRC  --   --   --   --  0.52  --  0.29*  --  0.13*  CREATININE  --   --   --    < > 1.63*  --  1.59* 1.51* 1.82*  CKTOTAL  --  698*  --   --   --   --   --   --   --   TROPONINI 29.86*  --  24.97*  --   --   --   --   --   --    < > = values in this interval not displayed.    Estimated Creatinine Clearance: 41.2 mL/min (A) (by C-G formula based on SCr of 1.82 mg/dL (H)).  Assessment: 66 y.o. male with VDRF/cardiogenic shock, elevated cardiac markers, for heparin. He is noted s/p broch 11/15 and heparin was restarted 4 hours post procedure. Plans for cath today -heparin level = 0.13  Goal of Therapy:  Heparin level 0.3-0.7 units/ml Monitor platelets by anticoagulation protocol: Yes   Plan:  -Increase heparin to 1300 units/hr -Will follow plans post cath  Hildred Laser, Pharm D 07/08/2017 8:08 AM

## 2017-07-23 NOTE — Progress Notes (Signed)
eLink Physician-Brief Progress Note Patient Name: Adam Fulmore Sr. DOB: 1951-02-19 MRN: [id number]   Date of Service  08/05/2017  HPI/Events of Note  Mg 1.6  eICU Interventions  Repleted     Intervention Category Minor Interventions: Electrolytes abnormality - evaluation and management  Makenzey Nanni 07/15/2017, 1:52 AM

## 2017-07-23 NOTE — Progress Notes (Signed)
Pt was restless attempting to climb out of bed and agitated, after calming pt down with talking him through the event, pt desat to 58, turn blue in the face and went unresponsive. [place] 3l was in his nose, increased to 6 L and Bag-value mask was being prepared at the beside. CCM was called and notified of event and arrived shortly at the bedside.

## 2017-07-23 NOTE — Progress Notes (Signed)
Patient noted to have stridor.  Patient given 2 racemic epi treatments and steroids ordered.  Upon arrival patient noted to have expiratory wheezing, more prominent in neck, and using neck muscles.  Gave PRN Duoneb treatment and placed patient on cool mist aerosol mask at 35%.  Will continue to monitor.

## 2017-07-23 NOTE — Progress Notes (Signed)
RT transported patient from 2H16 to cath lab for procedure and then back to room 2H16 with no apparent complications. Vitals are stable and sats are 100%. RT will continue to monitor.

## 2017-07-23 NOTE — Progress Notes (Signed)
  Date of Service  11/162018   HPI/Events  Bradycardia, Hypoxia, AMS   Interventions  -Patient Intubated -Spoke with Cardiology > Will resume heparin given 8 hours post-cath, if patient requires pacing overnight will call back and cardiology will place pacing wire. Cardiology requested that before extubation to be notified as patient will most likely require pacer   [name], AGACNP-BC [place]  Pgr: [phone]  PCCM Pgr: [phone]

## 2017-07-23 NOTE — Interval H&P Note (Signed)
Cath Lab Visit (complete for each Cath Lab visit)  Clinical Evaluation Leading to the Procedure:   ACS: Yes.    Non-ACS:    Anginal Classification: CCS IV  Anti-ischemic medical therapy: Minimal Therapy (1 class of medications)  Non-Invasive Test Results: No non-invasive testing performed  Prior CABG: No previous CABG  Symptomatic bradycardia    History and Physical Interval Note:  07/27/2017 8:45 AM  Adam Smiling Sr.  has presented today for surgery, with the diagnosis of n stemi  The various methods of treatment have been discussed with the patient and family. After consideration of risks, benefits and other options for treatment, the patient has consented to  Procedure(s): LEFT HEART CATH AND CORONARY ANGIOGRAPHY (N/A) as a surgical intervention .  The patient's history has been reviewed, patient examined, no change in status, stable for surgery.  I have reviewed the patient's chart and labs.  Questions were answered to the patient's satisfaction.     Larae Grooms

## 2017-07-23 NOTE — Procedures (Signed)
Endotracheal Intubation Procedure Note  Indication for endotracheal intubation: impending respiratory failure Acute hypoxic respiratory failure  Airway Assessment: Mallampati Class: II (hard and soft palate, upper portion of tonsils anduvula visible) Sedation: etomidate. 20 mg  Fentanyl 50 mg and versed 2 mg  Paralytic: none. Lidocaine: no. Atropine: no. Equipment: Macintosh 4 laryngoscope blade. Cricoid Pressure: no. Number of attempts: 1. ETT location confirmed by by CXR.  Patient family Wife and daughter were notified of patient clinical deterioration before the procedure. Patient was emergently intubated due to hypoxia and significant desaturation. Family was notified after procedure. Resumed on previous vent setting.   Adam Lopez 07/28/2017

## 2017-07-23 NOTE — Procedures (Signed)
Arterial Catheter Insertion Procedure Note Adam Lopez [id number] April 04, 1951  Procedure: Insertion of Arterial Catheter  Indications: Blood pressure monitoring and Frequent blood sampling  Procedure Details Consent: Unable to obtain consent because of altered level of consciousness. Time Out: Verified patient identification, verified procedure, site/side was marked, verified correct patient position, special equipment/implants available, medications/allergies/relevent history reviewed, required imaging and test results available.  Performed  Maximum sterile technique was used including antiseptics, cap, gloves, gown, hand hygiene, mask and sheet. Skin prep: Chlorhexidine; local anesthetic administered 20 gauge catheter was inserted into right radial artery using the Seldinger technique.  Evaluation Blood flow good; BP tracing good. Complications: No apparent complications.   Adam Lopez 07/15/2017

## 2017-07-23 NOTE — Progress Notes (Signed)
Progress Note  Patient Name: Adam Prehn Sr. Date of Encounter: 07/09/2017  Primary Cardiologist:   Dr. Jovita Gamma  Subjective   He again denies any pain or SOB.  He is awake and has had one episode apparently of decreased BP/HR last night.    Inpatient Medications    Scheduled Meds: . aspirin  81 mg Oral Daily  . folic acid  1 mg Intravenous Daily  . ipratropium-albuterol  3 mL Nebulization Q6H  . lactulose  30 g Oral Daily  . mouth rinse  15 mL Mouth Rinse Q2H  . ondansetron (ZOFRAN) IV  4 mg Intravenous Once  . pantoprazole sodium  40 mg Per Tube QHS  . sodium chloride flush  10-40 mL Intracatheter Q12H  . thiamine injection  100 mg Intravenous Daily   Continuous Infusions: . sodium chloride Stopped (07/22/17 2227)  . sodium chloride Stopped (07/21/17 1607)  . cefTRIAXone (ROCEPHIN)  IV Stopped (07/22/17 1645)  . DOPamine 5.039 mcg/kg/min (07/16/2017 0600)  . feeding supplement (VITAL AF 1.2 CAL) Stopped (08/06/2017 0809)  . fentaNYL infusion INTRAVENOUS 50 mcg/hr (08/01/2017 0600)  . heparin Stopped (07/21/2017 0807)  . midazolam (VERSED) infusion 1 mg/hr (07/17/2017 0600)  . norepinephrine (LEVOPHED) Adult infusion 7 mcg/min (07/31/2017 0600)  . vasopressin (PITRESSIN) infusion - *FOR SHOCK* Stopped (07/19/17 1830)   PRN Meds: sodium chloride, atropine, fentaNYL, ibuprofen, midazolam, sodium chloride flush   Vital Signs    Vitals:   07/08/2017 0630 07/08/2017 0645 07/21/2017 0735 08/03/2017 0737  BP: 103/66 121/60 (!) 146/71   Pulse: 99 (!) 103 (!) 104   Resp: 15 15 14    Temp: (!) 101.1 F (38.4 C) (!) 100.9 F (38.3 C) (!) 100.6 F (38.1 C)   TempSrc:      SpO2: 100% 100% 100% 100%  Weight:  167 lb 15.9 oz (76.2 kg)    Height:        Intake/Output Summary (Last 24 hours) at 07/25/2017 0814 Last data filed at 07/25/2017 0600 Gross per 24 hour  Intake 2231.3 ml  Output 730 ml  Net 1501.3 ml   Filed Weights   07/21/17 0500 07/22/17 0455 08/03/2017 0645    Weight: 160 lb 7.9 oz (72.8 kg) 160 lb 4.4 oz (72.7 kg) 167 lb 15.9 oz (76.2 kg)    Telemetry    NSR, episodes of junctional heart rhythm with heart block.  First degree AV block  ECG    NA  Physical Exam   GEN: No  acute distress.   Neck: No  JVD Cardiac: RRR, no murmurs, rubs, or gallops.  Respiratory:  Decreased breath sounds with coarse crackles GI:  Positive bowel sounds, mildly distended MS:  Diffuse edema; No deformity. Neuro:   Nonfocal  Psych:  Intubated.    Labs    Chemistry Recent Labs  Lab 07/15/2017 1848  07/20/17 0313 07/21/17 0500 07/22/17 0430 07/24/2017 0053 07/28/2017 0640  NA 127*   < > 130* 132* 134* 135 133*  K 4.7   < > 3.7 3.8 3.7 4.0 3.6  CL 98*   < > 100* 101 103 105 103  CO2 19*   < > 22 24 25 24  21*  GLUCOSE 100*   < > 108* 79 99 120* 146*  BUN 13   < > 21* 19 18 15  22*  CREATININE 1.39*   < > 1.96* 1.63* 1.59* 1.51* 1.82*  CALCIUM 8.1*   < > 7.1* 7.3* 7.4* 7.8* 7.5*  PROT 6.3*  --  5.0* 5.1*  --   --   --   ALBUMIN 2.8*  --  2.3* 2.3*  --   --   --   AST 86*  --  4,288* 1,616*  --   --   --   ALT 38  --  2,117* 1,413*  --   --   --   ALKPHOS 123  --  168* 163*  --   --   --   BILITOT 0.7  --  1.1 1.8*  --   --   --   GFRNONAA 51*   < > 34* 42* 44* 46* 37*  GFRAA 59*   < > 39* 49* 51* 54* 43*  ANIONGAP 10   < > 8 7 6 6 9    < > = values in this interval not displayed.     Hematology Recent Labs  Lab 07/22/17 0430 07/13/2017 0053 08/01/2017 0640  WBC 7.6 11.7* 10.2  RBC 3.12* 3.38* 3.19*  HGB 9.6* 10.6* 9.8*  HCT 30.1* 33.2* 31.3*  MCV 96.5 98.2 98.1  MCH 30.8 31.4 30.7  MCHC 31.9 31.9 31.3  RDW 13.3 13.6 13.7  PLT 147* 201 205    Cardiac Enzymes Recent Labs  Lab 07/19/17 2145 07/20/17 0313 07/20/17 0900 07/20/17 1640  TROPONINI 30.66* 27.43* 29.86* 24.97*    Recent Labs  Lab 07/28/2017 1906 07/27/2017 2121  TROPIPOC 0.25* 0.29*     BNPNo results for input(s): BNP, PROBNP in the last 168 hours.   DDimer  Recent  Labs  Lab 07/20/17 0313  DDIMER >20.00*     Radiology    Dg Chest Port 1 View  Result Date: 07/22/2017 CLINICAL DATA:  Acute respiratory acidosis, ventilator dependent respiratory failure, shock, bradycardia, former smoker. EXAM: PORTABLE CHEST 1 VIEW COMPARISON:  Chest x-ray of July 21, 2017 FINDINGS: The lungs are well-expanded. There is increased density in the mid and lower lungs greatest on the right likely reflecting posterior layering of pleural fluid. The cardiac silhouette is mildly enlarged. The pulmonary vascularity is not clearly engorged. The endotracheal tube tip lies approximately 3.8 cm above the carina. The esophagogastric tube tip and proximal port project below the GE junction. The right internal jugular venous catheter tip projects over the midportion of the SVC. There is calcification in the wall of the aortic arch. IMPRESSION: Increase conspicuity of posterior layering pleural effusions bilaterally greatest on the right. No definite pulmonary edema. Thoracic aortic atherosclerosis. The support tubes are in reasonable position. Electronically Signed   By: David  Martinique M.D.   On: 07/22/2017 08:39    Cardiac Studies   ECHO:   Study Conclusions  - Left ventricle: The cavity size was normal. There was moderate   concentric hypertrophy. Systolic function was normal. The   estimated ejection fraction was in the range of 60% to 65%. Wall   motion was normal; there were no regional wall motion   abnormalities. - Mitral valve: Calcified annulus. - Right ventricle: Systolic function was moderately reduced. - Tricuspid valve: There was mild regurgitation. - Pericardium, extracardiac: The pericardium appears thickened at   the apex as well as the apical RV wall with small pericardial   effusion over the apex. Suggest cardiac MRI to evaluate   pericardium further.   Patient Profile     66 y.o. male with PMH CAD s/p RCA stent 2013, AAA s/p open repair 2016 with  aortobifemoral bypass grafts, HTN, dyslipidemia seen in consult at the request of Dr. Tomi Bamberger for bradycardia,  hypotension, and syncope.  Assessment & Plan    NSTEMI:   Cath needs to be today in order to advance his therapies.  We need to understand his coronary anatomy and see if there are any high risk lesions.  This will allow [place] to possibly understand whether there is any ischemic component to his brady episodes and whether he is at risk for ischemic complications with possible attempts at weaning extubation.  The patient understands that risks included but are not limited to stroke (1 in 1000), death (1 in 97), kidney failure [usually temporary] (1 in 500), bleeding (1 in 200), allergic reaction [possibly serious] (1 in 200).  The patient understands and agrees to proceed.   BRADYCARDIA:  I reviewed telemetry and he again had one episode of bradycardia with slight drop in BP and AMS last night.  There are episodes of bradycardia with narrow 50 BMP escape and some heart block.  The predominant rate is sinus tach.  He does have first degree AV block.    AKI:  Up slightly today.  Risk of CIN discussed with the family but we can use a limited amount of contrast for this cath which will likely be diagnostic only.    LUNG MASS:  Bronchoscopy yesterday with complete upstruction of the entire RUL noted.    ELEVATED LIVER ENZYMES:   These did trend down.  Follow up per CCM.   ABNORMAL PERICARDIUM:   Apical thickening noted. This could represent malignancy given the lung and liver lesions.  MRI would be needed to further image this but not indicated at this time.  Effusion is not hemodynamically significant.    For questions or updates, please contact [place] Please consult [email or web address] for contact info under Cardiology/STEMI.   Signed, Minus Breeding, MD  07/14/2017, 8:14 AM

## 2017-07-23 NOTE — Procedures (Signed)
Extubation Procedure Note  Patient Details:   Name: Adam Stradling Sr. DOB: [birth year] MRN: [id number]   Airway Documentation:  Airway 7.5 mm (Active)  Secured at (cm) 24 cm 08/01/2017 11:32 AM  Measured From Lips 07/22/2017 11:32 AM  Secured Location Right 07/08/2017 11:32 AM  Secured By [employer] 07/14/2017 11:32 AM  Tube Holder Repositioned Yes 07/26/2017 11:32 AM  Cuff Pressure (cm H2O) 28 cm H2O 07/22/2017  7:36 AM  Site Condition Dry 07/20/2017 11:32 AM    Evaluation  O2 sats: stable throughout Complications: No apparent complications Patient did tolerate procedure well. Bilateral Breath Sounds: Clear, Diminished   Yes   Patient extubated per order to 4L [place]. Positive cuff leak was noted prior to extubation. Patient is able to speak. Vitals are stable and sats are 99%. RT will continue to monitor.   Markiyah Gahm Clyda Greener 08/01/2017, 2:46 PM

## 2017-07-23 NOTE — Progress Notes (Signed)
[place] for Heparin Indication: chest pain/ACS  Allergies  Allergen Reactions  . Chlorhexidine Itching and Rash  . Codeine Itching and Rash    Patient Measurements: Height: 5\' 10"  (177.8 cm) Weight: 167 lb 15.9 oz (76.2 kg) IBW/kg (Calculated) : 73  Vital Signs: Temp: 98.4 F (36.9 C) (11/16 2105) Temp Source: Core (11/16 1600) BP: 143/64 (11/16 2105) Pulse Rate: 90 (11/16 2105)  Labs: Recent Labs    07/21/17 0500 07/21/17 1045 07/22/17 0430 07/22/2017 0053 08/06/2017 0640 07/27/2017 1030  HGB 10.3*  --  9.6* 10.6* 9.8* 10.2*  HCT 31.5*  --  30.1* 33.2* 31.3* 32.6*  PLT 151  --  147* 201 205 215  APTT  --  88*  --   --   --   --   LABPROT  --  14.8  --   --   --   --   INR  --  1.17  --   --   --   --   HEPARINUNFRC 0.52  --  0.29*  --  0.13*  --   CREATININE 1.63*  --  1.59* 1.51* 1.82* 2.01*    Estimated Creatinine Clearance: 37.3 mL/min (A) (by C-G formula based on SCr of 2.01 mg/dL (H)).  Assessment: 66 y.o. male with VDRF/cardiogenic shock, elevated cardiac markers s/p cardiac cath. Heparin was initially discontinue post cath and sq heparin ordered. Patient is now having chest pain and in acute respiratory failure requiring intubation. Physician decided to resume IV heparin per pharmacy dosing consult. Hgb 9.8- stable for admission. Platelets up at 205. No bleeding reported.   Patient was on  Goal of Therapy:  Heparin level 0.3-0.7 units/ml Monitor platelets by anticoagulation protocol: Yes   Plan:  Resume heparin at 1300 units/hr.  No bolus with recent cath this AM.  Heparin level in 6 hours.  Daily heparin level and CBC.  Monitor for bleeding from cath site.   Sloan Leiter, PharmD, BCPS, BCCCP Clinical Pharmacist Clinical phone 07/11/2017 until [phone] After hours, please call #[phone]  07/20/2017 9:29 PM

## 2017-07-23 NOTE — Progress Notes (Signed)
CRITICAL VALUE ALERT  Critical Value:  Troponin 4.04  Date & Time Notied:  08/06/2017 @ 2150 Provider Notified: Dr Ashok Cordia, CCM in [place]  Orders Received/Actions taken: no orders given

## 2017-07-23 NOTE — Progress Notes (Signed)
eLink Physician-Brief Progress Note Patient Name: Adam Maring Sr. DOB: May 21, 1951 MRN: [id number]   Date of Service  07/21/2017  HPI/Events of Note  Nurse reported patient had brief loss of consciousness and acute desaturation with cyanosis. Lysbeth Galas shows patient with eyes open now but still tachypneic to the 40s. Nursing staff at bedside.   eICU Interventions  Contacted PCCM APP for bedside assessment.     Intervention Category Major Interventions: Change in mental status - evaluation and management  Tera Partridge 07/17/2017, 7:50 PM

## 2017-07-23 NOTE — H&P (View-Only) (Signed)
Progress Note  Patient Name: Adam Zanni Sr. Date of Encounter: 07/19/2017  Primary Cardiologist:   Dr. Jovita Gamma  Subjective   He again denies any pain or SOB.  He is awake and has had one episode apparently of decreased BP/HR last night.    Inpatient Medications    Scheduled Meds: . aspirin  81 mg Oral Daily  . folic acid  1 mg Intravenous Daily  . ipratropium-albuterol  3 mL Nebulization Q6H  . lactulose  30 g Oral Daily  . mouth rinse  15 mL Mouth Rinse Q2H  . ondansetron (ZOFRAN) IV  4 mg Intravenous Once  . pantoprazole sodium  40 mg Per Tube QHS  . sodium chloride flush  10-40 mL Intracatheter Q12H  . thiamine injection  100 mg Intravenous Daily   Continuous Infusions: . sodium chloride Stopped (07/22/17 2227)  . sodium chloride Stopped (07/21/17 1607)  . cefTRIAXone (ROCEPHIN)  IV Stopped (07/22/17 1645)  . DOPamine 5.039 mcg/kg/min (08/05/2017 0600)  . feeding supplement (VITAL AF 1.2 CAL) Stopped (07/20/2017 0809)  . fentaNYL infusion INTRAVENOUS 50 mcg/hr (07/21/2017 0600)  . heparin Stopped (07/19/2017 0807)  . midazolam (VERSED) infusion 1 mg/hr (07/13/2017 0600)  . norepinephrine (LEVOPHED) Adult infusion 7 mcg/min (07/29/2017 0600)  . vasopressin (PITRESSIN) infusion - *FOR SHOCK* Stopped (07/19/17 1830)   PRN Meds: sodium chloride, atropine, fentaNYL, ibuprofen, midazolam, sodium chloride flush   Vital Signs    Vitals:   07/11/2017 0630 07/31/2017 0645 07/28/2017 0735 07/31/2017 0737  BP: 103/66 121/60 (!) 146/71   Pulse: 99 (!) 103 (!) 104   Resp: 15 15 14    Temp: (!) 101.1 F (38.4 C) (!) 100.9 F (38.3 C) (!) 100.6 F (38.1 C)   TempSrc:      SpO2: 100% 100% 100% 100%  Weight:  167 lb 15.9 oz (76.2 kg)    Height:        Intake/Output Summary (Last 24 hours) at 07/08/2017 0814 Last data filed at 07/14/2017 0600 Gross per 24 hour  Intake 2231.3 ml  Output 730 ml  Net 1501.3 ml   Filed Weights   07/21/17 0500 07/22/17 0455 07/21/2017 0645    Weight: 160 lb 7.9 oz (72.8 kg) 160 lb 4.4 oz (72.7 kg) 167 lb 15.9 oz (76.2 kg)    Telemetry    NSR, episodes of junctional heart rhythm with heart block.  First degree AV block  ECG    NA  Physical Exam   GEN: No  acute distress.   Neck: No  JVD Cardiac: RRR, no murmurs, rubs, or gallops.  Respiratory:  Decreased breath sounds with coarse crackles GI:  Positive bowel sounds, mildly distended MS:  Diffuse edema; No deformity. Neuro:   Nonfocal  Psych:  Intubated.    Labs    Chemistry Recent Labs  Lab 07/15/2017 1848  07/20/17 0313 07/21/17 0500 07/22/17 0430 07/27/2017 0053 07/27/2017 0640  NA 127*   < > 130* 132* 134* 135 133*  K 4.7   < > 3.7 3.8 3.7 4.0 3.6  CL 98*   < > 100* 101 103 105 103  CO2 19*   < > 22 24 25 24  21*  GLUCOSE 100*   < > 108* 79 99 120* 146*  BUN 13   < > 21* 19 18 15  22*  CREATININE 1.39*   < > 1.96* 1.63* 1.59* 1.51* 1.82*  CALCIUM 8.1*   < > 7.1* 7.3* 7.4* 7.8* 7.5*  PROT 6.3*  --  5.0* 5.1*  --   --   --   ALBUMIN 2.8*  --  2.3* 2.3*  --   --   --   AST 86*  --  4,288* 1,616*  --   --   --   ALT 38  --  2,117* 1,413*  --   --   --   ALKPHOS 123  --  168* 163*  --   --   --   BILITOT 0.7  --  1.1 1.8*  --   --   --   GFRNONAA 51*   < > 34* 42* 44* 46* 37*  GFRAA 59*   < > 39* 49* 51* 54* 43*  ANIONGAP 10   < > 8 7 6 6 9    < > = values in this interval not displayed.     Hematology Recent Labs  Lab 07/22/17 0430 07/30/2017 0053 07/14/2017 0640  WBC 7.6 11.7* 10.2  RBC 3.12* 3.38* 3.19*  HGB 9.6* 10.6* 9.8*  HCT 30.1* 33.2* 31.3*  MCV 96.5 98.2 98.1  MCH 30.8 31.4 30.7  MCHC 31.9 31.9 31.3  RDW 13.3 13.6 13.7  PLT 147* 201 205    Cardiac Enzymes Recent Labs  Lab 07/19/17 2145 07/20/17 0313 07/20/17 0900 07/20/17 1640  TROPONINI 30.66* 27.43* 29.86* 24.97*    Recent Labs  Lab 07/17/2017 1906 07/17/2017 2121  TROPIPOC 0.25* 0.29*     BNPNo results for input(s): BNP, PROBNP in the last 168 hours.   DDimer  Recent  Labs  Lab 07/20/17 0313  DDIMER >20.00*     Radiology    Dg Chest Port 1 View  Result Date: 07/22/2017 CLINICAL DATA:  Acute respiratory acidosis, ventilator dependent respiratory failure, shock, bradycardia, former smoker. EXAM: PORTABLE CHEST 1 VIEW COMPARISON:  Chest x-ray of July 21, 2017 FINDINGS: The lungs are well-expanded. There is increased density in the mid and lower lungs greatest on the right likely reflecting posterior layering of pleural fluid. The cardiac silhouette is mildly enlarged. The pulmonary vascularity is not clearly engorged. The endotracheal tube tip lies approximately 3.8 cm above the carina. The esophagogastric tube tip and proximal port project below the GE junction. The right internal jugular venous catheter tip projects over the midportion of the SVC. There is calcification in the wall of the aortic arch. IMPRESSION: Increase conspicuity of posterior layering pleural effusions bilaterally greatest on the right. No definite pulmonary edema. Thoracic aortic atherosclerosis. The support tubes are in reasonable position. Electronically Signed   By: David  Martinique M.D.   On: 07/22/2017 08:39    Cardiac Studies   ECHO:   Study Conclusions  - Left ventricle: The cavity size was normal. There was moderate   concentric hypertrophy. Systolic function was normal. The   estimated ejection fraction was in the range of 60% to 65%. Wall   motion was normal; there were no regional wall motion   abnormalities. - Mitral valve: Calcified annulus. - Right ventricle: Systolic function was moderately reduced. - Tricuspid valve: There was mild regurgitation. - Pericardium, extracardiac: The pericardium appears thickened at   the apex as well as the apical RV wall with small pericardial   effusion over the apex. Suggest cardiac MRI to evaluate   pericardium further.   Patient Profile     66 y.o. male with PMH CAD s/p RCA stent 2013, AAA s/p open repair 2016 with  aortobifemoral bypass grafts, HTN, dyslipidemia seen in consult at the request of Dr. Tomi Bamberger for bradycardia,  hypotension, and syncope.  Assessment & Plan    NSTEMI:   Cath needs to be today in order to advance his therapies.  We need to understand his coronary anatomy and see if there are any high risk lesions.  This will allow [place] to possibly understand whether there is any ischemic component to his brady episodes and whether he is at risk for ischemic complications with possible attempts at weaning extubation.  The patient understands that risks included but are not limited to stroke (1 in 1000), death (1 in 59), kidney failure [usually temporary] (1 in 500), bleeding (1 in 200), allergic reaction [possibly serious] (1 in 200).  The patient understands and agrees to proceed.   BRADYCARDIA:  I reviewed telemetry and he again had one episode of bradycardia with slight drop in BP and AMS last night.  There are episodes of bradycardia with narrow 50 BMP escape and some heart block.  The predominant rate is sinus tach.  He does have first degree AV block.    AKI:  Up slightly today.  Risk of CIN discussed with the family but we can use a limited amount of contrast for this cath which will likely be diagnostic only.    LUNG MASS:  Bronchoscopy yesterday with complete upstruction of the entire RUL noted.    ELEVATED LIVER ENZYMES:   These did trend down.  Follow up per CCM.   ABNORMAL PERICARDIUM:   Apical thickening noted. This could represent malignancy given the lung and liver lesions.  MRI would be needed to further image this but not indicated at this time.  Effusion is not hemodynamically significant.    For questions or updates, please contact [place] Please consult [email or web address] for contact info under Cardiology/STEMI.   Signed, Minus Breeding, MD  08/01/2017, 8:14 AM

## 2017-07-23 NOTE — Progress Notes (Signed)
PCCM INTERVAL PROGRESS NOTE  Called to bedside for patient with respiratory distress. He was extubated earlier today and has had periods of agitation and the lethargy. Developed stridor, which responded nicely to decadron and racemic epi. Upon my arrival to bedside he was tachypneic and appeared dyspneic. He denied SOB, CP, or pain otherwise. ABG wnl and CXR without new infiltrate.   As time progressed he calmed down and breathing improved. Looks much more comfortable now. Seems to be related to agitation and delirium  Will start some low dose haldol (qtc 480) to be used conservatively.    Georgann Housekeeper, AGACNP-BC [facility] Pulmonology/Critical Care Pager [phone] or [phone]  08/01/2017 6:35 PM

## 2017-07-23 NOTE — Progress Notes (Signed)
Called with patient "wheezing" actually stridor.  Steroids and racemic epi ordered.  If worse then BiPAP.  Rush Farmer, M.D. [facility] Pulmonary/Critical Care Medicine. Pager: [phone]. After hours pager: [phone].

## 2017-07-23 NOTE — Progress Notes (Signed)
PULMONARY / CRITICAL CARE MEDICINE   Name: Adam Hanover Sr. MRN: [id number] DOB: 1951/05/10    ADMISSION DATE:  07/22/2017 CONSULTATION DATE:  07/20/2017  REFERRING MD:  Dr. Tomi Bamberger  CHIEF COMPLAINT:  Chest Pain   HISTORY OF PRESENT ILLNESS:   66 year old male with PMH of CAD s/p RCA stent 2013, AAA s/p open repair in 2016 with aortiobifemoral bypass graft, HTN, HLD, ETOH  Presents to ED on 11/11 after wife found patient face down for unknown time. When EMS arrived patient complaining of central chest pain/syncope. Family states he took an unknown amount of nitro tablets. EMS gave 9 mcg EPI and 1 mg Atropine for Bradycardia and Hypotension. Upon arrival to ED patient is diaphoretic and non-rebreather. Intubated in ED. ABG 7.353/25.5/282. ETOH 155. BP Systolic 70-78, on external pacer, underlying HR 15. Cardiology consulted. PCCM asked to admit.   Upon arrival to ED patient is on 20 Levophed with Systolic 40 and currently on external pacer. When looking at [facility] bottle, patient only missing 4 tablets (family states they are sure he took one on Halloween)  Family reports for the last few months patient has been complaining of progressive dyspnea, over the last couple of weeks intermittent chest pain. Per family patient drinks heavily.     SUBJECTIVE:  No events overnight, to the cath lab this AM  VITAL SIGNS: BP (!) 105/49 (BP Location: Right Leg)   Pulse 92   Temp (!) 100.6 F (38.1 C)   Resp 14   Ht 5' 10"  (1.778 m)   Wt 76.2 kg (167 lb 15.9 oz)   SpO2 100%   BMI 24.10 kg/m   HEMODYNAMICS: CVP:  [13 mmHg-21 mmHg] 15 mmHg  VENTILATOR SETTINGS: Vent Mode: PRVC FiO2 (%):  [30 %-40 %] 40 % Set Rate:  [14 bmp] 14 bmp Vt Set:  [580 mL] 580 mL PEEP:  [5 cmH20] 5 cmH20 Plateau Pressure:  [17 cmH20-20 cmH20] 18 cmH20  INTAKE / OUTPUT: I/O last 3 completed shifts: In: 4169.7 [P.O.:50; I.V.:2165; NG/GT:1754.7; IV Piggyback:200] Out: 1245 [Urine:1245]  PHYSICAL  EXAMINATION: General: Arousable, NAD Neuro: Alert and interactive, moving all ext to command HEENT: [place]/AT, PERRL, EOM-I and MMM PULM: CTA bilaterally CV: RRR, Nl S1/S2, -M/R/G. GI: Soft, NT, ND and +BS Extremities: -edema and -tenderness  LABS:  BMET Recent Labs  Lab 07/22/17 0430 07/11/2017 0053 08/03/2017 0640  NA 134* 135 133*  K 3.7 4.0 3.6  CL 103 105 103  CO2 25 24 21*  BUN 18 15 22*  CREATININE 1.59* 1.51* 1.82*  GLUCOSE 99 120* 146*   Electrolytes Recent Labs  Lab 07/20/17 0313 07/20/17 1640  07/22/17 0430 07/08/2017 0053 07/28/2017 0640  CALCIUM 7.1*  --    < > 7.4* 7.8* 7.5*  MG 1.6* 1.9  --   --  1.6*  --   PHOS 3.3 3.0  --   --  2.7  --    < > = values in this interval not displayed.   CBC Recent Labs  Lab 07/22/17 0430 07/30/2017 0053 08/03/2017 0640  WBC 7.6 11.7* 10.2  HGB 9.6* 10.6* 9.8*  HCT 30.1* 33.2* 31.3*  PLT 147* 201 205   Coag's Recent Labs  Lab 07/14/2017 1855 08/05/2017 2229 07/21/17 1045  APTT  --  52* 88*  INR 1.15  --  1.17   Sepsis Markers Recent Labs  Lab 07/14/2017 2229 07/10/2017 2303 07/19/17 0447 07/20/17 0313 07/20/17 1102  LATICACIDVEN  --  7.1* 3.4*  --  1.1  PROCALCITON <0.10  --  1.10 1.46  --    ABG Recent Labs  Lab 07/19/17 0130 07/19/17 1010 07/20/17 0159  PHART 7.306* 7.411 7.489*  PCO2ART 37.8 36.8 27.8*  PO2ART 90.8 148.0* 102.0   Liver Enzymes Recent Labs  Lab 07/24/2017 1848 07/20/17 0313 07/21/17 0500  AST 86* 4,288* 1,616*  ALT 38 2,117* 1,413*  ALKPHOS 123 168* 163*  BILITOT 0.7 1.1 1.8*  ALBUMIN 2.8* 2.3* 2.3*   Cardiac Enzymes Recent Labs  Lab 07/20/17 0313 07/20/17 0900 07/20/17 1640  TROPONINI 27.43* 29.86* 24.97*   Glucose Recent Labs  Lab 07/22/17 1132 07/22/17 1634 07/22/17 2014 07/25/2017 0009 07/11/2017 0423 07/27/2017 0749  GLUCAP 99 98 84 75 135* 139*   Imaging No results found.  STUDIES:  CXR 11/11 > Endotracheal tube 5.7 cm from carina. Enteric tube tip below  the field of view in the abdomen. Transcutaneous pacing pads noted. Stable cardiac silhouette. Aortic atherosclerosis. Clear lungs. No acute osseous abnormality is evident ABD 11/11 > Nonobstructive bowel gas pattern. Enteric tube with tip over the stomach and side-port at the region of the gastroesophageal junction. This could be advanced approximately 8 cm. CTA Chest 11/11 > 2.9 cm right upper lobe cavitary spiculated nodule and 3.7 cm right lower lobe mass. Findings probably represent synchronous or primary/metastatic lung cancer. Distant metastatic disease is less likely. The right upper lobe lesion invades the hilum. There is a satellite nodule inferior to the right lower lobe lesion. Sub- carinal lymphadenopathy. 2. No aortic dissection. 3. Left common carotid artery origin fibrofatty plaque with moderate 50% stenosis. 4. Cardiomegaly and severe coronary artery calcification.  CT A/P 11/11 > 1. Diffuse gallbladder wall thickening and extensive inflammation surrounding the pancreas. Inflammation is likely due to acute pancreatitis with either reactive changes of the gallbladder or concurrent acute cholecystitis. 2. No aortic dissection. 3. Infrarenal abdominal aortic repair with patent bilateral aortofemoral bypass. Extensive atherosclerotic disease with severe proximal femoral artery stenosis, severe mid SMA stenosis, severe bilateral renal artery stenosis. 4. Layering density within the gallbladder may represent small stones or vicarious excretion of contrast. 5. Small volume of ascites.  [place] 11/13>>>Three liver masses measuring up to 3.2 cm, favor metastatic disease given chest CT findings yesterday. 2. Cholelithiasis. No visible choledocholithiasis or common bile duct dilatation. 3. Prominent gallbladder wall edema. This could be reactive rather than cholecystitis given the gallbladder is not over dilated  CULTURES: U/A 11/11 > Negative  Blood Culture 11/11 >> Sputum 11/11  >>  ANTIBIOTICS: Vancomycin 11/11>>>11/12,11/13>>>11/14 Zosyn 11/11>>>1/15 Ctx 1/15>>>  SIGNIFICANT EVENTS: 11/11 > Presents to ED  1/12- shock, brady, vagal  LINES/TUBES: ETT 11/11 >> Right IJ 11/11 >>  Right Femoral 11/11 >>  DISCUSSION: 66 year old male presents to ED hypoxic, hypotensive, and bradycardiac. Intubated in ED. Cardiology consulted.   ASSESSMENT / PLAN:  PULMONARY A: Acute Hypoxic Respiratory Failure  CT Chest with 2.9 cm right upper lobe cavitary spiculated nodule and 3.7 cm right lower lobe mass highly suspicious for malignancy  With mets H/O Tobacco Use  Increased edema 11/15 P:   Bronch done 11/15, results pending PS as tolerated but hold extubation given cath lab visit today and interventions probable Hold lasix given cath lab  CARDIOVASCULAR A:  Septic vs Cardiogenic Shock, acidosis related likely  CAD s/p RCA stent 2013, AAA repair 2016 H/O HTN, HLD  CT Chest with cardiomegaly and severe coronary artery calcification.  Rule out ischemia, cath at some point per cards Trop elevated P:  Levophed for BP support ASA Heparin drip per cards  RENAL A:   ATN likely, slight better daily P:   KVO Chem in am  Hold lasix today Replace electrolytes as indicated  GASTROINTESTINAL A:   Transaminase elevation- resolving, shock liver, MI contribtuion Met liver , primary lung? [place] neg gallbaldder P:   TF per nutrition AFP  - wnl LFT in AM  HEMATOLOGIC A:   VTE Prop  Rule out ischemia anemia P:  Trend CBC in am   SCDs  Heparin per cards  INFECTIOUS A:   NO PNA P:   Fever noted Rocephin  ENDOCRINE A:   Hyperglycemia  Now bordwerline low HIGH cortisol response-poor outcome>? P:   CBGs SSI  NEUROLOGIC A:   Acute Metabolic Encephalopathy  - resolved H/O ETOH ETOH 155  Not tolerate benzo well , liit as able P:   RASS goal: 0/-1 Fentanyl drip Folic Acid/Thiamine - ensure IV Lactulose keep as daily  FAMILY  - Updates: No  family bedside to update  - Inter-disciplinary family meet or Palliative Care meeting due VF:MBBU by df 11/14  The patient is critically ill with multiple organ systems failure and requires high complexity decision making for assessment and support, frequent evaluation and titration of therapies, application of advanced monitoring technologies and extensive interpretation of multiple databases.   Critical Care Time devoted to patient care services described in this note is  35  Minutes. This time reflects time of care of this signee Dr Jennet Maduro. This critical care time does not reflect procedure time, or teaching time or supervisory time of PA/NP/Med student/Med Resident etc but could involve care discussion time.  Rush Farmer, M.D. [facility] Pulmonary/Critical Care Medicine. Pager: [phone]. After hours pager: [phone].

## 2017-07-24 ENCOUNTER — Inpatient Hospital Stay (HOSPITAL_COMMUNITY): Payer: Medicare HMO

## 2017-07-24 DIAGNOSIS — I480 Paroxysmal atrial fibrillation: Secondary | ICD-10-CM

## 2017-07-24 DIAGNOSIS — I739 Peripheral vascular disease, unspecified: Secondary | ICD-10-CM

## 2017-07-24 DIAGNOSIS — I5031 Acute diastolic (congestive) heart failure: Secondary | ICD-10-CM

## 2017-07-24 LAB — GLUCOSE, CAPILLARY
GLUCOSE-CAPILLARY: 132 mg/dL — AB (ref 65–99)
GLUCOSE-CAPILLARY: 138 mg/dL — AB (ref 65–99)
GLUCOSE-CAPILLARY: 133 mg/dL — AB (ref 65–99)
Glucose-Capillary: 128 mg/dL — ABNORMAL HIGH (ref 65–99)
Glucose-Capillary: 129 mg/dL — ABNORMAL HIGH (ref 65–99)

## 2017-07-24 LAB — BASIC METABOLIC PANEL
Anion gap: 11 (ref 5–15)
BUN: 30 mg/dL — AB (ref 6–20)
CALCIUM: 8.2 mg/dL — AB (ref 8.9–10.3)
CO2: 20 mmol/L — ABNORMAL LOW (ref 22–32)
CREATININE: 1.9 mg/dL — AB (ref 0.61–1.24)
Chloride: 103 mmol/L (ref 101–111)
GFR calc Af Amer: 41 mL/min — ABNORMAL LOW (ref >60)
GFR, EST NON AFRICAN AMERICAN: 35 mL/min — AB (ref >60)
GLUCOSE: 132 mg/dL — AB (ref 65–99)
POTASSIUM: 3.9 mmol/L (ref 3.5–5.1)
SODIUM: 134 mmol/L — AB (ref 135–145)

## 2017-07-24 LAB — BLOOD GAS, ARTERIAL
Acid-base deficit: 3.4 mmol/L — ABNORMAL HIGH (ref 0.0–2.0)
BICARBONATE: 20.4 mmol/L (ref 20.0–28.0)
DRAWN BY: 51133
FIO2: 40
MECHVT: 580 mL
O2 Saturation: 97 %
PATIENT TEMPERATURE: 98.6
PEEP/CPAP: 5 cmH2O
PO2 ART: 97.2 mmHg (ref 83.0–108.0)
RATE: 14 resp/min
pCO2 arterial: 32.4 mmHg (ref 32.0–48.0)
pH, Arterial: 7.415 (ref 7.350–7.450)

## 2017-07-24 LAB — CBC
HCT: 33 % — ABNORMAL LOW (ref 39.0–52.0)
Hemoglobin: 10.4 g/dL — ABNORMAL LOW (ref 13.0–17.0)
MCH: 30.5 pg (ref 26.0–34.0)
MCHC: 31.5 g/dL (ref 30.0–36.0)
MCV: 96.8 fL (ref 78.0–100.0)
PLATELETS: 223 10*3/uL (ref 150–400)
RBC: 3.41 MIL/uL — AB (ref 4.22–5.81)
RDW: 13.8 % (ref 11.5–15.5)
WBC: 8.8 10*3/uL (ref 4.0–10.5)

## 2017-07-24 LAB — BRAIN NATRIURETIC PEPTIDE: B Natriuretic Peptide: 3789.9 pg/mL — ABNORMAL HIGH (ref 0.0–100.0)

## 2017-07-24 LAB — HEPATIC FUNCTION PANEL
ALBUMIN: 2.2 g/dL — AB (ref 3.5–5.0)
ALK PHOS: 137 U/L — AB (ref 38–126)
ALT: 457 U/L — AB (ref 17–63)
AST: 131 U/L — AB (ref 15–41)
BILIRUBIN INDIRECT: 0.4 mg/dL (ref 0.3–0.9)
Bilirubin, Direct: 0.4 mg/dL (ref 0.1–0.5)
TOTAL PROTEIN: 5.7 g/dL — AB (ref 6.5–8.1)
Total Bilirubin: 0.8 mg/dL (ref 0.3–1.2)

## 2017-07-24 LAB — HEPARIN LEVEL (UNFRACTIONATED)
HEPARIN UNFRACTIONATED: 0.45 [IU]/mL (ref 0.30–0.70)
Heparin Unfractionated: 0.36 IU/mL (ref 0.30–0.70)

## 2017-07-24 LAB — MAGNESIUM: MAGNESIUM: 1.9 mg/dL (ref 1.7–2.4)

## 2017-07-24 LAB — PHOSPHORUS: Phosphorus: 4.8 mg/dL — ABNORMAL HIGH (ref 2.5–4.6)

## 2017-07-24 LAB — LACTIC ACID, PLASMA: Lactic Acid, Venous: 1.3 mmol/L (ref 0.5–1.9)

## 2017-07-24 LAB — CK: Total CK: 143 U/L (ref 49–397)

## 2017-07-24 MED ORDER — LEVOFLOXACIN IN D5W 750 MG/150ML IV SOLN
750.0000 mg | INTRAVENOUS | Status: DC
  Administered 2017-07-24 – 2017-07-28 (×3): 750 mg via INTRAVENOUS
  Filled 2017-07-24 (×3): qty 150

## 2017-07-24 MED ORDER — MIDAZOLAM HCL 2 MG/2ML IJ SOLN
2.0000 mg | INTRAMUSCULAR | Status: DC | PRN
  Administered 2017-07-24 – 2017-07-25 (×7): 2 mg via INTRAVENOUS
  Filled 2017-07-24 (×7): qty 2

## 2017-07-24 NOTE — Progress Notes (Signed)
Progress Note  Patient Name: Adam Rendell Sr. Date of Encounter: 07/24/2017  Primary Cardiologist: Branch/Allred  Subjective   He is awake and follows some commands, fails to make eye contact, but appropriately nods yes and no to questions and even seems to appreciate humor.    Inpatient Medications    Scheduled Meds: . aspirin  81 mg Oral Daily  . folic acid  1 mg Intravenous Daily  . ipratropium-albuterol  3 mL Nebulization Q6H  . lactulose  30 g Oral Daily  . mouth rinse  15 mL Mouth Rinse Q2H  . methylPREDNISolone (SOLU-MEDROL) injection  40 mg Intravenous Q6H  . pantoprazole sodium  40 mg Per Tube QHS  . Racepinephrine HCl  0.5 mL Nebulization Once  . sodium chloride flush  10-40 mL Intracatheter Q12H  . sodium chloride flush  3 mL Intravenous Q12H  . thiamine injection  100 mg Intravenous Daily   Continuous Infusions: . sodium chloride Stopped (07/22/17 2227)  . sodium chloride Stopped (07/21/17 1607)  . sodium chloride 250 mL (07/24/17 0500)  . sodium chloride    . cefTRIAXone (ROCEPHIN)  IV Stopped (07/24/2017 1529)  . DOPamine 5 mcg/kg/min (07/24/17 0903)  . feeding supplement (VITAL AF 1.2 CAL) Stopped (07/29/2017 1011)  . fentaNYL infusion INTRAVENOUS 200 mcg/hr (07/24/17 0830)  . heparin 1,300 Units/hr (07/24/17 0700)  . norepinephrine (LEVOPHED) Adult infusion Stopped (07/10/2017 2300)  . vasopressin (PITRESSIN) infusion - *FOR SHOCK* Stopped (07/19/17 1830)   PRN Meds: sodium chloride, sodium chloride, Place/Maintain arterial line **AND** sodium chloride, atropine, fentaNYL, ipratropium-albuterol, midazolam, midazolam, sodium chloride flush, sodium chloride flush   Vital Signs    Vitals:   07/24/17 0645 07/24/17 0700 07/24/17 0800 07/24/17 0831  BP:  117/81 134/81   Pulse: 80 79 81   Resp: 14 14 14    Temp: 98.2 F (36.8 C) 98.2 F (36.8 C) 98.1 F (36.7 C)   TempSrc:      SpO2: 100% 100% 100% 100%  Weight:      Height:         Intake/Output Summary (Last 24 hours) at 07/24/2017 0956 Last data filed at 07/24/2017 0800 Gross per 24 hour  Intake 699.44 ml  Output 900 ml  Net -200.56 ml   Filed Weights   07/22/17 0455 07/17/2017 0645 07/24/17 0448  Weight: 160 lb 4.4 oz (72.7 kg) 167 lb 15.9 oz (76.2 kg) 162 lb 4.1 oz (73.6 kg)    Telemetry    Atrial fibrillation yesterday evening, converted to sinus rhythm.  A couple of episodes of severe bradycardia with junctional escape rhythm overnight, none today - personally Reviewed  ECG    Yesterday's tracing shows sinus rhythm with frequent PACs, first-degree AV block, anterolateral T wave inversion consistent with ischemia- Personally Reviewed  Physical Exam  Intubated, calm.  Eyes open but does not track.  Looks younger than stated age. GEN: No acute distress.   Neck: No JVD Cardiac: RRR, no murmurs, rubs, or gallops.  Respiratory: Clear to auscultation bilaterally. GI: Soft, nontender, non-distended  MS: No edema; No deformity. Neuro:  Nonfocal  Psych: Normal affect   Labs    Chemistry Recent Labs  Lab 07/20/17 0313 07/21/17 0500  07/12/2017 0640 07/11/2017 1030 08/04/2017 2034 07/24/17 0432  NA 130* 132*   < > 133*  --  133* 134*  K 3.7 3.8   < > 3.6  --  4.2 3.9  CL 100* 101   < > 103  --  101 103  CO2 22 24   < > 21*  --  19* 20*  GLUCOSE 108* 79   < > 146*  --  114* 132*  BUN 21* 19   < > 22*  --  27* 30*  CREATININE 1.96* 1.63*   < > 1.82* 2.01* 2.09* 1.90*  CALCIUM 7.1* 7.3*   < > 7.5*  --  8.2* 8.2*  PROT 5.0* 5.1*  --   --   --   --  5.7*  ALBUMIN 2.3* 2.3*  --   --   --   --  2.2*  AST 4,288* 1,616*  --   --   --   --  131*  ALT 2,117* 1,413*  --   --   --   --  457*  ALKPHOS 168* 163*  --   --   --   --  137*  BILITOT 1.1 1.8*  --   --   --   --  0.8  GFRNONAA 34* 42*   < > 37* 33* 31* 35*  GFRAA 39* 49*   < > 43* 38* 36* 41*  ANIONGAP 8 7   < > 9  --  13 11   < > = values in this interval not displayed.     Hematology Recent  Labs  Lab 07/19/2017 0640 07/15/2017 1030 07/24/17 0432  WBC 10.2 10.8* 8.8  RBC 3.19* 3.26* 3.41*  HGB 9.8* 10.2* 10.4*  HCT 31.3* 32.6* 33.0*  MCV 98.1 100.0 96.8  MCH 30.7 31.3 30.5  MCHC 31.3 31.3 31.5  RDW 13.7 14.0 13.8  PLT 205 215 223    Cardiac Enzymes Recent Labs  Lab 07/20/17 0313 07/20/17 0900 07/20/17 1640 07/08/2017 2034  TROPONINI 27.43* 29.86* 24.97* 4.04*    Recent Labs  Lab 07/29/2017 1906 07/16/2017 2121  TROPIPOC 0.25* 0.29*     BNP Recent Labs  Lab 07/09/2017 2034 07/24/17 0432  BNP 4,018.6* 3,789.9*     DDimer  Recent Labs  Lab 07/20/17 0313  DDIMER >20.00*     Radiology    Dg Chest Port 1 View  Result Date: 07/24/2017 CLINICAL DATA:  History of endotracheal tube.  Respiratory failure. EXAM: PORTABLE CHEST 1 VIEW COMPARISON:  One-view chest x-ray 07/24/2017. FINDINGS: Endotracheal tube is stable, 5 cm above the carina. A right IJ line is stable. Right greater than left pleural effusions and associated airspace disease are similar to the prior study. The patient is rotated to the right. IMPRESSION: 1. Stable right greater than left pleural effusions and associated airspace disease, likely atelectasis. 2. Support apparatus is stable. Electronically Signed   By: San Morelle M.D.   On: 07/24/2017 07:10   Dg Chest Port 1 View  Result Date: 08/04/2017 CLINICAL DATA:  Status post intubation EXAM: PORTABLE CHEST 1 VIEW COMPARISON:  Film from earlier in the same day. FINDINGS: Cardiac shadow is stable. Endotracheal tube is now seen in satisfactory position. Right jugular central line is noted. Bilateral pleural effusions right greater than left are noted. Underlying atelectatic changes are noted on the right. No other focal abnormality is seen. IMPRESSION: Bilateral pleural effusions right greater than left. Right basilar atelectasis is seen. Tubes and lines as described. Electronically Signed   By: Inez Catalina M.D.   On: 07/27/2017 20:49   Dg  Chest Port 1 View  Result Date: 07/09/2017 CLINICAL DATA:  Extubated this afternoon. Respiratory distress, abnormal breathing. EXAM: PORTABLE CHEST 1 VIEW COMPARISON:  Chest x-ray dated 07/22/2017 and chest  x-ray dated 07/21/2017. FINDINGS: Endotracheal tube has been removed. Right IJ central line is stable in position with tip at the level of the lower SVC/ cavoatrial junction. Heart size is stable. Veiled opacity noted at the right lung base, likely layering pleural effusion. Left lung remains clear. No pneumothorax seen. IMPRESSION: 1. Veiled opacity at the right lung base, similar to the appearance on chest x-ray of 07/22/2017, likely small layering pleural effusion. Lungs otherwise clear. No pneumothorax seen. 2. Interval removal of the endotracheal tube. 3. Right IJ line remains well positioned with tip at the level of the lower SVC/cavoatrial junction. Electronically Signed   By: Franki Cabot M.D.   On: 07/22/2017 18:37    Cardiac Studies   Echo 07/19/2017  - Left ventricle: The cavity size was normal. There was moderate   concentric hypertrophy. Systolic function was normal. The   estimated ejection fraction was in the range of 60% to 65%. Wall   motion was normal; there were no regional wall motion   abnormalities. - Mitral valve: Calcified annulus. - Right ventricle: Systolic function was moderately reduced. - Tricuspid valve: There was mild regurgitation. - Pericardium, extracardiac: The pericardium appears thickened at   the apex as well as the apical RV wall with small pericardial   effusion over the apex. Suggest cardiac MRI to evaluate   pericardium further.    Cath 08/03/2017  Ost 1st Mrg lesion is 50% stenosed.  Ost Cx to Mid Cx lesion is 25% stenosed.  Prox LAD to Mid LAD lesion is 25% stenosed.  Ost RCA to Prox RCA lesion is 90% stenosed.  Mid RCA lesion is 99% stenosed.  Ost RPDA to RPDA lesion is 90% stenosed.  Previously placed Dist RCA stent (unknown  type) is widely patent.  LV end diastolic pressure is normal.  There is no aortic valve stenosis.   Subtotally occluded RCA with thrombus, left to right collaterals.  Continue medical therapy.   Coronary Diagrams   Diagnostic Diagram         Patient Profile     66 y.o. male with known CAD (previous RCA stent 2013) and PAD (AAA, aortobifem bypass 2016), hypertension presented with hypotension and bradycardia leading to syncope on November 11.  Found to have subtotal occlusion with heavy thrombus burden in the right coronary artery (with formed left to right collaterals), deemed best for medical therapy.  Developed transient atrial fibrillation, now resolved.  Pauses overnight. Also has right upper lobe and right lower lobe masses strongly suspicious for lung cancer  Assessment & Plan    1. NSTEMI: medical management, on IV heparin.  Would benefit from antiplatelet therapy with clopidogrel or Brilinta, but will hold off since there may be need for lung biopsy or other invasive procedures.  Preserved left ventricular systolic function. 2. AFib: Resolved, likely to recur, plan discharge with oral anticoagulant.  Did not have serious problems with rapid ventricular response.  Avoid negative chronotropic agents and antiarrhythmics due to periods of bradycardia/junctional rhythm. 3.  Bradycardia: May be related to AV node ischemic injury, seems to be improving.  Avoid negative chronotropic agents.  Still on a very low-dose of dopamine for transient hypotension, likely to discontinue this today. 4.  Lung masses: Highly suspicious for lung cancer 5.  Acute respiratory failure work on weaning from ventilator today 6.  PAD: Previous aortobifemoral bypass, with evidence of extensive atherosclerosis involving the mid SMA artery and bilateral renal arteries.  Watch for evidence of mesenteric ischemia. 7.  Acute  on chronic renal insufficiency: Creatinine peaked at 2.1 and is improving.  Baseline seems  to be around 1.4-1.6.  Clinically he appears to be volume overloaded.  He has bilateral pleural effusions and peripheral edema.  Will benefit from diuretics prior to extubation.  For questions or updates, please contact [place] Please consult [email or web address] for contact info under Cardiology/STEMI.      Signed, Sanda Klein, MD  07/24/2017, 9:56 AM

## 2017-07-24 NOTE — Progress Notes (Signed)
PULMONARY / CRITICAL CARE MEDICINE   Name: Adam Pflaum Sr. MRN: [id number] DOB: 01-10-1951    ADMISSION DATE:  07/15/2017 CONSULTATION DATE:  08/03/2017  REFERRING MD:  Dr. Tomi Bamberger  CHIEF COMPLAINT:  Chest Pain   HISTORY OF PRESENT ILLNESS:   66 year old male with PMH of CAD s/p RCA stent 2013, AAA s/p open repair in 2016 with aortiobifemoral bypass graft, HTN, HLD, ETOH  Presents to ED on 11/11 after wife found patient face down for unknown time. When EMS arrived patient complaining of central chest pain/syncope. Family states he took an unknown amount of nitro tablets. EMS gave 9 mcg EPI and 1 mg Atropine for Bradycardia and Hypotension. Upon arrival to ED patient was diaphoretic and required non-rebreather. Intubated in ED. ABG 7.353/25.5/282. ETOH 155. BP Systolic 16-01, on external pacer, underlying HR 15. Levophed gtt.  Cardiology consulted. PCCM asked to admit.    When looking at [facility] bottle, patient only missing 4 tablets (family states they are sure he took one on Halloween).  Family reports for the last few months patient has been complaining of progressive dyspnea, over the last couple of weeks intermittent chest pain. Per family patient drinks heavily.     SUBJECTIVE:  Failed extubation overnight, reintubated. RN reports no further bradycardia.  Remains on dopamine 38mg's, fentanyl 2546m's.  Requiring PRN versed due to agitation.   VITAL SIGNS: BP 134/81 (BP Location: Left Leg)   Pulse 81   Temp 98.1 F (36.7 C)   Resp 14   Ht _0  (1.778 m)   Wt 162 lb 4.1 oz (73.6 kg)   SpO2 100%   BMI 23.28 kg/m   HEMODYNAMICS: CVP:  [14 mmHg-17 mmHg] 14 mmHg  VENTILATOR SETTINGS: Vent Mode: CPAP;PSV FiO2 (%):  [35 %-100 %] 40 % Set Rate:  [14 bmp] 14 bmp Vt Set:  [580 mL] 580 mL PEEP:  [5 cmH20] 5 cmH20 Pressure Support:  [8 cmH20] 8 cmH20 Plateau Pressure:  [17 cmH20-20 cmH20] 18 cmH20  INTAKE / OUTPUT: I/O last 3 completed shifts: In: 2121.6 [P.O.:50;  I.V.:1036.6; NG/GT:935; IV Piggyback:100] Out: 1185 [Urine:1185]  PHYSICAL EXAMINATION: General: thin adult male, critically ill appearing, in NAD HEENT: MM pink/moist, ETT, R IJ TLC  Neuro: Awakens to voice, follows commands  CV: s1s2 rrr, no m/r/g PULM: even/non-labored, clear on L, bronchial breath sounds R base  GIUX:NATFnon-tender, bsx4 active  Extremities: warm/dry, no edema, SCD's in place Skin: no rashes or lesions  LABS:  BMET Recent Labs  Lab 07/12/2017 0640 07/25/2017 1030 08/04/2017 2034 07/24/17 0432  NA 133*  --  133* 134*  K 3.6  --  4.2 3.9  CL 103  --  101 103  CO2 21*  --  19* 20*  BUN 22*  --  27* 30*  CREATININE 1.82* 2.01* 2.09* 1.90*  GLUCOSE 146*  --  114* 132*   Electrolytes Recent Labs  Lab 07/20/17 1640  07/09/2017 0053 07/13/2017 0640 07/26/2017 2034 07/24/17 0432  CALCIUM  --    < > 7.8* 7.5* 8.2* 8.2*  MG 1.9  --  1.6*  --   --  1.9  PHOS 3.0  --  2.7  --   --  4.8*   < > = values in this interval not displayed.   CBC Recent Labs  Lab 07/30/2017 0640 07/19/2017 1030 07/24/17 0432  WBC 10.2 10.8* 8.8  HGB 9.8* 10.2* 10.4*  HCT 31.3* 32.6* 33.0*  PLT 205 215 223   Coag's  Recent Labs  Lab 07/30/2017 1855 07/08/2017 2229 07/21/17 1045  APTT  --  52* 88*  INR 1.15  --  1.17   Sepsis Markers Recent Labs  Lab 07/17/2017 2229  07/19/17 0447 07/20/17 0313 07/20/17 1102 07/24/17 0432  LATICACIDVEN  --    < > 3.4*  --  1.1 1.3  PROCALCITON <0.10  --  1.10 1.46  --   --    < > = values in this interval not displayed.   ABG Recent Labs  Lab 07/17/2017 1812 07/28/2017 2133 07/24/17 0440  PHART 7.326* 7.332* 7.415  PCO2ART 42.7 43.2 32.4  PO2ART 296.0* 358.0* 97.2   Liver Enzymes Recent Labs  Lab 07/20/17 0313 07/21/17 0500 07/24/17 0432  AST 4,288* 1,616* 131*  ALT 2,117* 1,413* 457*  ALKPHOS 168* 163* 137*  BILITOT 1.1 1.8* 0.8  ALBUMIN 2.3* 2.3* 2.2*   Cardiac Enzymes Recent Labs  Lab 07/20/17 0900 07/20/17 1640  07/08/2017 2034  TROPONINI 29.86* 24.97* 4.04*   Glucose Recent Labs  Lab 07/20/2017 0749 07/12/2017 1210 07/25/2017 1544 07/30/2017 2113 07/12/2017 2358 07/24/17 0730  GLUCAP 139* 115* 108* 118* 129* 132*   Imaging Dg Chest Port 1 View  Result Date: 07/24/2017 CLINICAL DATA:  History of endotracheal tube.  Respiratory failure. EXAM: PORTABLE CHEST 1 VIEW COMPARISON:  One-view chest x-ray 07/30/2017. FINDINGS: Endotracheal tube is stable, 5 cm above the carina. A right IJ line is stable. Right greater than left pleural effusions and associated airspace disease are similar to the prior study. The patient is rotated to the right. IMPRESSION: 1. Stable right greater than left pleural effusions and associated airspace disease, likely atelectasis. 2. Support apparatus is stable. Electronically Signed   By: San Morelle M.D.   On: 07/24/2017 07:10   Dg Chest Port 1 View  Result Date: 08/04/2017 CLINICAL DATA:  Status post intubation EXAM: PORTABLE CHEST 1 VIEW COMPARISON:  Film from earlier in the same day. FINDINGS: Cardiac shadow is stable. Endotracheal tube is now seen in satisfactory position. Right jugular central line is noted. Bilateral pleural effusions right greater than left are noted. Underlying atelectatic changes are noted on the right. No other focal abnormality is seen. IMPRESSION: Bilateral pleural effusions right greater than left. Right basilar atelectasis is seen. Tubes and lines as described. Electronically Signed   By: Inez Catalina M.D.   On: 07/28/2017 20:49   Dg Chest Port 1 View  Result Date: 07/09/2017 CLINICAL DATA:  Extubated this afternoon. Respiratory distress, abnormal breathing. EXAM: PORTABLE CHEST 1 VIEW COMPARISON:  Chest x-ray dated 07/22/2017 and chest x-ray dated 07/21/2017. FINDINGS: Endotracheal tube has been removed. Right IJ central line is stable in position with tip at the level of the lower SVC/ cavoatrial junction. Heart size is stable. Veiled opacity  noted at the right lung base, likely layering pleural effusion. Left lung remains clear. No pneumothorax seen. IMPRESSION: 1. Veiled opacity at the right lung base, similar to the appearance on chest x-ray of 07/22/2017, likely small layering pleural effusion. Lungs otherwise clear. No pneumothorax seen. 2. Interval removal of the endotracheal tube. 3. Right IJ line remains well positioned with tip at the level of the lower SVC/cavoatrial junction. Electronically Signed   By: Franki Cabot M.D.   On: 07/19/2017 18:37    STUDIES:  CTA Chest 11/11 >> 2.9 cm right upper lobe cavitary spiculated nodule and 3.7 cm right lower lobe mass. Findings probably represent synchronous or primary/metastatic lung cancer. Distant metastatic disease is less likely. The  right upper lobe lesion invades the hilum. There is a satellite nodule inferior to the right lower lobe lesion. Sub- carinal lymphadenopathy. No aortic dissection. Left common carotid artery origin fibrofatty plaque with moderate 50% stenosis. Cardiomegaly and severe coronary artery calcification.  CT A/P 11/11 >> Diffuse gallbladder wall thickening & extensive inflammation surrounding the pancreas. Inflammation is likely due to acute pancreatitis with either reactive changes of the gallbladder or concurrent acute cholecystitis. No aortic dissection. Infrarenal abdominal aortic repair with patent bilateral aortofemoral bypass. Extensive atherosclerotic disease with severe proximal femoral artery stenosis, severe mid SMA stenosis, severe bilateral renal artery stenosis. Layering density within the gallbladder may represent small stones or vicarious excretion of contrast.  Small volume of ascites. [place] ABD 11/13 >> Three liver masses measuring up to 3.2 cm, favor metastatic disease given chest CT findings yesterday. Cholelithiasis. No visible choledocholithiasis or common bile duct dilatation.Prominent gallbladder wall edema. This could be reactive rather than  cholecystitis given the gallbladder is not over dilated [facility] 11/16 >> multivessel disease (25-90%), occluded RCA with thrombus, left to right collaterals, LVEDP normal, no AV stensois  CULTURES: U/A 11/11 >> Negative  Blood Culture 11/11 >> negative Sputum11/11 >> normal flora BAL 11/15 >> 100 colonies mold >> ? Contaminant  BAL 11/15 >> stenotrophomonas maltophilia >>   CYTOLOGY  FOB 11/15 >>   ANTIBIOTICS: Vancomycin 11/11 >> 11/12,11/13 >> 11/14 Zosyn 11/11 >> 1/15 Ceftriaxone 11/15 >> Levaquin 11/17 >>   SIGNIFICANT EVENTS: 11/11  Admitted with syncope, chest pain, brady/hypotensive, externally paced, pressors, intubated 11/12  Shock, brady, vagal 11/15  Bronch   LINES/TUBES: ETT 11/11 >> 11/16, 11/16 >> Right IJ 11/11 >>  R Radial Aline 11/11 >>  DISCUSSION: 66 year old male admitted 11/11 with chest pain, SOB.  Found to be hypoxic, hypotensive, and bradycardiac. Intubated in ED. Cardiology consulted.  Chest imaging concerning for malignancy with mets.  Pathology pending.   ASSESSMENT / PLAN:  PULMONARY A: Acute Hypoxic Respiratory Failure  Failed Extubation 11/16 - stridor, poor effort  RUL Cavitary Spiculated  Nodule - CT Chest with 2.9 cm right upper lobe cavitary spiculated nodule and 3.7 cm right lower lobe mass highly suspicious for malignancy with mets H/O Tobacco Use  Pulmonary Edema  Stenotrophomonas in Sputum  P:   PRVC 8cc/kg Wean PEEP/FiO2 for sats > 90% PSV as tolerated Hold lasix today, consider in am if renal function stable Duoneb Q6 + PRN  Solumedrol 40 mg IV q6 for 8 doses   CARDIOVASCULAR A:  Bradycardia - ? AV nodal injury NSTEMI Septic vs Cardiogenic Shock, acidosis related likely  CAD s/p RCA stent 2013, AAA repair 2016 PAD AF - resolved, cards expects to reoccur  H/O HTN, HLD  P:  Dopamine for HR/BP support ASA Continue heparin gtt per pharmacy  Appreciate Cardiology input  Trend BNP  Tele monitoring  Atropine PRN for  bradycardia   RENAL A:   Acute on Chronic Renal Insufficiency - improving   P:   Trend BMP / urinary output Replace electrolytes as indicated Avoid nephrotoxic agents, ensure adequate renal perfusion Hold lasix 111/7, consider gentle diuresis in am 11/18   GASTROINTESTINAL A:   Transaminase elevation - resolving shock liver, MI contribution, US gallbladder negative. AFP wnl. Met liver, primary lung? P:   TF per Nutrition  Trend LFT's  HEMATOLOGIC A:   VTE Prop  Rule out ischemia Anemia P:  Trend CBC in am   SCDs  Heparin per cards  INFECTIOUS A:  Fever  P:   Continue rocephin, D3/x  Cultures as above  ENDOCRINE A:   Hyperglycemia   HIGH cortisol response-poor outcome>? P:   CBG with SSI   NEUROLOGIC A:   Acute Metabolic Encephalopathy  - resolved H/O ETOH - ETOH 155  P:   RASS goal: 0 to -1  Fentanyl gtt for pain / sedation  Folic acid / thiamine   FAMILY  - Updates: No family at bedside am 11/17 on NP rounds.    CC Time: 30 minutes   Noe Gens, NP-C [facility] Pgr: [phone] or if no answer [phone] 07/24/2017, 10:57 AM

## 2017-07-24 NOTE — Progress Notes (Signed)
Wasted 67ml iv versed, and 33ml iv fent. Witness by second RN, flushed down sink  Witness by Marcelino Duster

## 2017-07-24 NOTE — Progress Notes (Signed)
[facility] for Heparin Indication: chest pain/ACS  Allergies  Allergen Reactions  . Chlorhexidine Itching and Rash  . Codeine Itching and Rash    Patient Measurements: Height: 5\' 10"  (177.8 cm) Weight: 162 lb 4.1 oz (73.6 kg) IBW/kg (Calculated) : 73  Vital Signs: Temp: 97.9 F (36.6 C) (11/17 1244) Temp Source: Other (Comment) (11/17 0615) BP: 94/61 (11/17 0900) Pulse Rate: 87 (11/17 1244)  Labs: Recent Labs    07/17/2017 0640 08/04/2017 1030 07/15/2017 2034 07/24/17 0432 07/24/17 1255  HGB 9.8* 10.2*  --  10.4*  --   HCT 31.3* 32.6*  --  33.0*  --   PLT 205 215  --  223  --   HEPARINUNFRC 0.13*  --   --  0.36 0.45  CREATININE 1.82* 2.01* 2.09* 1.90*  --   CKTOTAL  --   --   --  143  --   TROPONINI  --   --  4.04*  --   --     Estimated Creatinine Clearance: 39.5 mL/min (A) (by C-G formula based on SCr of 1.9 mg/dL (H)).  Assessment: 66 y.o. male with VDRF/cardiogenic shock, elevated cardiac markers s/p cardiac cath.  Patient is now having chest pain and in acute respiratory failure requiring intubation.   Heparin continues for afib which is likely to return. Cardiology planning oral anticoagulation once stable. No issues noted overnight. CBC stable  Goal of Therapy:  Heparin level 0.3-0.7 units/ml Monitor platelets by anticoagulation protocol: Yes   Plan:  Continue heparin at 1300 units/hr.  Daily heparin level and CBC.   Erin Hearing PharmD., BCPS Clinical Pharmacist Pager [phone] 07/24/2017 2:40 PM

## 2017-07-24 NOTE — Progress Notes (Signed)
ANTICOAGULATION CONSULT NOTE - Follow Up Consult  Pharmacy Consult for heparin Indication: chest pain/ACS  Labs: Recent Labs    07/21/17 1045 07/22/17 0430  07/08/2017 0640 07/21/2017 1030 07/31/2017 2034 07/24/17 0432  HGB  --  9.6*   < > 9.8* 10.2*  --  10.4*  HCT  --  30.1*   < > 31.3* 32.6*  --  33.0*  PLT  --  147*   < > 205 215  --  223  APTT 88*  --   --   --   --   --   --   LABPROT 14.8  --   --   --   --   --   --   INR 1.17  --   --   --   --   --   --   HEPARINUNFRC  --  0.29*  --  0.13*  --   --  0.36  CREATININE  --  1.59*   < > 1.82* 2.01* 2.09* 1.90*  CKTOTAL  --   --   --   --   --   --  143  TROPONINI  --   --   --   --   --  4.04*  --    < > = values in this interval not displayed.    Assessment/Plan:  66yo male therapeutic on heparin after resumed for recurrent CP. Will continue gtt at current rate and confirm stable with additional level.   Wynona Neat, PharmD, BCPS  07/24/2017,5:49 AM

## 2017-07-25 ENCOUNTER — Inpatient Hospital Stay (HOSPITAL_COMMUNITY): Payer: Medicare HMO

## 2017-07-25 LAB — GLUCOSE, CAPILLARY
GLUCOSE-CAPILLARY: 103 mg/dL — AB (ref 65–99)
GLUCOSE-CAPILLARY: 115 mg/dL — AB (ref 65–99)
GLUCOSE-CAPILLARY: 143 mg/dL — AB (ref 65–99)
Glucose-Capillary: 132 mg/dL — ABNORMAL HIGH (ref 65–99)
Glucose-Capillary: 131 mg/dL — ABNORMAL HIGH (ref 65–99)
Glucose-Capillary: 140 mg/dL — ABNORMAL HIGH (ref 65–99)

## 2017-07-25 LAB — COMPREHENSIVE METABOLIC PANEL
ALT: 287 U/L — AB (ref 17–63)
AST: 63 U/L — AB (ref 15–41)
Albumin: 2.2 g/dL — ABNORMAL LOW (ref 3.5–5.0)
Alkaline Phosphatase: 110 U/L (ref 38–126)
Anion gap: 8 (ref 5–15)
BILIRUBIN TOTAL: 0.7 mg/dL (ref 0.3–1.2)
BUN: 42 mg/dL — AB (ref 6–20)
CHLORIDE: 106 mmol/L (ref 101–111)
CO2: 22 mmol/L (ref 22–32)
CREATININE: 1.72 mg/dL — AB (ref 0.61–1.24)
Calcium: 8.4 mg/dL — ABNORMAL LOW (ref 8.9–10.3)
GFR, EST AFRICAN AMERICAN: 46 mL/min — AB (ref >60)
GFR, EST NON AFRICAN AMERICAN: 40 mL/min — AB (ref >60)
Glucose, Bld: 130 mg/dL — ABNORMAL HIGH (ref 65–99)
Potassium: 4 mmol/L (ref 3.5–5.1)
Sodium: 136 mmol/L (ref 135–145)
TOTAL PROTEIN: 5.4 g/dL — AB (ref 6.5–8.1)

## 2017-07-25 LAB — CBC
HCT: 30 % — ABNORMAL LOW (ref 39.0–52.0)
HEMOGLOBIN: 9.7 g/dL — AB (ref 13.0–17.0)
MCH: 31.5 pg (ref 26.0–34.0)
MCHC: 32.3 g/dL (ref 30.0–36.0)
MCV: 97.4 fL (ref 78.0–100.0)
Platelets: 254 10*3/uL (ref 150–400)
RBC: 3.08 MIL/uL — AB (ref 4.22–5.81)
RDW: 14.3 % (ref 11.5–15.5)
WBC: 8.9 10*3/uL (ref 4.0–10.5)

## 2017-07-25 LAB — HEPARIN LEVEL (UNFRACTIONATED)
HEPARIN UNFRACTIONATED: 0.71 [IU]/mL — AB (ref 0.30–0.70)
HEPARIN UNFRACTIONATED: 0.56 [IU]/mL (ref 0.30–0.70)

## 2017-07-25 MED ORDER — LORAZEPAM 2 MG/ML IJ SOLN: 2.0000 mg | Freq: Once | INTRAMUSCULAR | Status: AC

## 2017-07-25 MED ORDER — ORAL CARE MOUTH RINSE
15.0000 mL | Freq: Two times a day (BID) | OROMUCOSAL | Status: DC
  Administered 2017-07-26 – 2017-08-02 (×14): 15 mL via OROMUCOSAL

## 2017-07-25 MED ORDER — HYDRALAZINE HCL 20 MG/ML IJ SOLN
INTRAMUSCULAR | Status: AC
  Filled 2017-07-25: qty 1

## 2017-07-25 MED ORDER — LORAZEPAM 2 MG/ML IJ SOLN
0.5000 mg | INTRAMUSCULAR | Status: DC | PRN
  Administered 2017-07-25: 2 mg via INTRAVENOUS
  Filled 2017-07-25: qty 1

## 2017-07-25 MED ORDER — LORAZEPAM 2 MG/ML IJ SOLN
2.0000 mg | INTRAMUSCULAR | Status: DC | PRN
  Administered 2017-07-25 – 2017-07-29 (×8): 2 mg via INTRAVENOUS
  Filled 2017-07-25 (×8): qty 1

## 2017-07-25 MED ORDER — QUETIAPINE FUMARATE 25 MG PO TABS
50.0000 mg | ORAL_TABLET | Freq: Two times a day (BID) | ORAL | Status: DC
  Administered 2017-07-25 – 2017-08-02 (×15): 50 mg via ORAL
  Filled 2017-07-25: qty 2
  Filled 2017-07-25 (×5): qty 1
  Filled 2017-07-25 (×3): qty 2
  Filled 2017-07-25 (×3): qty 1
  Filled 2017-07-25 (×2): qty 2
  Filled 2017-07-25 (×2): qty 1
  Filled 2017-07-25: qty 2
  Filled 2017-07-25: qty 1

## 2017-07-25 MED ORDER — HYDRALAZINE HCL 20 MG/ML IJ SOLN
10.0000 mg | INTRAMUSCULAR | Status: DC | PRN
  Administered 2017-07-25: 10 mg via INTRAVENOUS

## 2017-07-25 MED ORDER — ORAL CARE MOUTH RINSE
15.0000 mL | Freq: Two times a day (BID) | OROMUCOSAL | Status: DC
  Administered 2017-07-26: 15 mL via OROMUCOSAL

## 2017-07-25 MED ORDER — CARVEDILOL 6.25 MG PO TABS
6.2500 mg | ORAL_TABLET | Freq: Two times a day (BID) | ORAL | Status: DC
  Administered 2017-07-25 – 2017-07-27 (×4): 6.25 mg via ORAL
  Filled 2017-07-25 (×4): qty 1

## 2017-07-25 MED ORDER — FUROSEMIDE 10 MG/ML IJ SOLN
40.0000 mg | Freq: Every day | INTRAMUSCULAR | Status: DC
  Administered 2017-07-25 – 2017-07-26 (×2): 40 mg via INTRAVENOUS
  Filled 2017-07-25 (×2): qty 4

## 2017-07-25 MED ORDER — AMLODIPINE BESYLATE 5 MG PO TABS
5.0000 mg | ORAL_TABLET | Freq: Every day | ORAL | Status: DC
  Administered 2017-07-25: 5 mg via ORAL
  Filled 2017-07-25: qty 1

## 2017-07-25 NOTE — Plan of Care (Signed)
  Activity: Ability to tolerate increased activity will improve 07/25/2017 0247 - Not Progressing by Lenox Ahr, RN   Respiratory: Ability to maintain a clear airway and adequate ventilation will improve 07/25/2017 0247 - Not Progressing by Lenox Ahr, RN

## 2017-07-25 NOTE — Progress Notes (Signed)
ANTICOAGULATION CONSULT NOTE - Follow Up Consult  Pharmacy Consult for heparin Indication: atrial fibrillation  Labs: Recent Labs    08/01/2017 1030 07/29/2017 2034 07/24/17 0432 07/24/17 1255 07/25/17 0414  HGB 10.2*  --  10.4*  --  9.7*  HCT 32.6*  --  33.0*  --  30.0*  PLT 215  --  223  --  254  HEPARINUNFRC  --   --  0.36 0.45 0.71*  CREATININE 2.01* 2.09* 1.90*  --  1.72*  CKTOTAL  --   --  143  --   --   TROPONINI  --  4.04*  --   --   --     Assessment: 66yo male now slightly above goal on heparin after several levels at goal though had been trending up.  Goal of Therapy:  Heparin level 0.3-0.7 units/ml   Plan:  Will decrease heparin gtt slightly to 1200 units/hr and check level in [place], PharmD, BCPS  07/25/2017,5:12 AM

## 2017-07-25 NOTE — Procedures (Signed)
Extubation Procedure Note  Patient Details:   Name: Adam Gramm Sr. DOB: April 04, 1951 MRN: [id number]   Airway Documentation:     Evaluation  O2 sats: stable throughout Complications: No apparent complications Patient did tolerate procedure well. Bilateral Breath Sounds: Clear, Diminished   Patient extubated to 3 L [place] without incident. Cuff Leak noted prior to extubation. Patient able to speak in soft tones post extubation.   Mali M Kawan [place] 07/25/2017, 9:44 AM

## 2017-07-25 NOTE — Progress Notes (Signed)
[place] for Heparin Indication: chest pain/ACS  Allergies  Allergen Reactions  . Chlorhexidine Itching and Rash  . Codeine Itching and Rash    Patient Measurements: Height: 5\' 10"  (177.8 cm) Weight: 166 lb 7.2 oz (75.5 kg) IBW/kg (Calculated) : 73  Vital Signs: Temp: 97.5 F (36.4 C) (11/18 1230) Temp Source: Core (Comment) (11/18 1100) BP: 139/81 (11/18 1230) Pulse Rate: 91 (11/18 1230)  Labs: Recent Labs    07/16/2017 1030 07/22/2017 2034 07/24/17 0432 07/24/17 1255 07/25/17 0414 07/25/17 1200  HGB 10.2*  --  10.4*  --  9.7*  --   HCT 32.6*  --  33.0*  --  30.0*  --   PLT 215  --  223  --  254  --   HEPARINUNFRC  --   --  0.36 0.45 0.71* 0.56  CREATININE 2.01* 2.09* 1.90*  --  1.72*  --   CKTOTAL  --   --  143  --   --   --   TROPONINI  --  4.04*  --   --   --   --     Estimated Creatinine Clearance: 43.6 mL/min (A) (by C-G formula based on SCr of 1.72 mg/dL (H)).  Assessment: 66 y.o. male with VDRF/cardiogenic shock, elevated cardiac markers s/p cardiac cath.  Heparin continues for afib which is likely to return. Cardiology planning oral anticoagulation once stable. No issues noted overnight. CBC stable  Goal of Therapy:  Heparin level 0.3-0.7 units/ml Monitor platelets by anticoagulation protocol: Yes   Plan:  Continue heparin at 1200 units/hr.  Daily heparin level and CBC.   Erin Hearing PharmD., BCPS Clinical Pharmacist Pager [phone] 07/25/2017 2:39 PM

## 2017-07-25 NOTE — Progress Notes (Signed)
Progress Note  Patient Name: Adam Wilford Sr. Date of Encounter: 07/25/2017  Primary Cardiologist: [place] estimated this morning.  He is alert, but a little agitated and confused..    Inpatient Medications    Scheduled Meds: . hydrALAZINE      . aspirin  81 mg Oral Daily  . folic acid  1 mg Intravenous Daily  . ipratropium-albuterol  3 mL Nebulization Q6H  . lactulose  30 g Oral Daily  . mouth rinse  15 mL Mouth Rinse q12n4p  . methylPREDNISolone (SOLU-MEDROL) injection  40 mg Intravenous Q6H  . pantoprazole sodium  40 mg Per Tube QHS  . sodium chloride flush  10-40 mL Intracatheter Q12H  . thiamine injection  100 mg Intravenous Daily   Continuous Infusions: . sodium chloride Stopped (07/21/17 1607)  . sodium chloride    . cefTRIAXone (ROCEPHIN)  IV Stopped (07/24/17 1500)  . heparin 1,200 Units/hr (07/25/17 0600)  . levofloxacin (LEVAQUIN) IV 750 mg (07/24/17 1652)   PRN Meds: sodium chloride, Place/Maintain arterial line **AND** sodium chloride, atropine, hydrALAZINE, ipratropium-albuterol, LORazepam, sodium chloride flush   Vital Signs    Vitals:   07/25/17 0811 07/25/17 0818 07/25/17 0900 07/25/17 0944  BP:   (!) 163/108   Pulse:      Resp:   (!) 25   Temp:   98.1 F (36.7 C)   TempSrc:      SpO2: 100% 99% (!) 66% 100%  Weight:      Height:        Intake/Output Summary (Last 24 hours) at 07/25/2017 1015 Last data filed at 07/25/2017 0900 Gross per 24 hour  Intake 1321.25 ml  Output 970 ml  Net 351.25 ml   Filed Weights   07/19/2017 0645 07/24/17 0448 07/25/17 0429  Weight: 167 lb 15.9 oz (76.2 kg) 162 lb 4.1 oz (73.6 kg) 166 lb 7.2 oz (75.5 kg)    Telemetry    Normal sinus rhythm with occasional PACs and PVCs.  No further problems with bradycardia or atrial fibrillation- Personally Reviewed  ECG    No new trace- Personally Reviewed  Physical Exam  Comfortable, agitated. Hoarse.  Appears younger than  stated age GEN: No acute distress.   Neck: No JVD Cardiac: RRR, no murmurs, rubs, or gallops.  Respiratory: Clear to auscultation bilaterally. GI: Soft, nontender, non-distended  MS: No edema; No deformity. Neuro:  Nonfocal  Psych: difficult to assess. He appears agitated (withdrawal sd.?).  Labs    Chemistry Recent Labs  Lab 07/21/17 0500  07/09/2017 2034 07/24/17 0432 07/25/17 0414  NA 132*   < > 133* 134* 136  K 3.8   < > 4.2 3.9 4.0  CL 101   < > 101 103 106  CO2 24   < > 19* 20* 22  GLUCOSE 79   < > 114* 132* 130*  BUN 19   < > 27* 30* 42*  CREATININE 1.63*   < > 2.09* 1.90* 1.72*  CALCIUM 7.3*   < > 8.2* 8.2* 8.4*  PROT 5.1*  --   --  5.7* 5.4*  ALBUMIN 2.3*  --   --  2.2* 2.2*  AST 1,616*  --   --  131* 63*  ALT 1,413*  --   --  457* 287*  ALKPHOS 163*  --   --  137* 110  BILITOT 1.8*  --   --  0.8 0.7  GFRNONAA 42*   < > 31* 35*  40*  GFRAA 49*   < > 36* 41* 46*  ANIONGAP 7   < > 13 11 8    < > = values in this interval not displayed.     Hematology Recent Labs  Lab 07/28/2017 1030 07/24/17 0432 07/25/17 0414  WBC 10.8* 8.8 8.9  RBC 3.26* 3.41* 3.08*  HGB 10.2* 10.4* 9.7*  HCT 32.6* 33.0* 30.0*  MCV 100.0 96.8 97.4  MCH 31.3 30.5 31.5  MCHC 31.3 31.5 32.3  RDW 14.0 13.8 14.3  PLT 215 223 254    Cardiac Enzymes Recent Labs  Lab 07/20/17 0313 07/20/17 0900 07/20/17 1640 07/17/2017 2034  TROPONINI 27.43* 29.86* 24.97* 4.04*    Recent Labs  Lab 08/06/2017 1906 07/26/2017 2121  TROPIPOC 0.25* 0.29*     BNP Recent Labs  Lab 07/30/2017 2034 07/24/17 0432  BNP 4,018.6* 3,789.9*     DDimer  Recent Labs  Lab 07/20/17 0313  DDIMER >20.00*     Radiology    Dg Chest Port 1 View  Result Date: 07/25/2017 CLINICAL DATA:  Acute respiratory failure with hypoxia EXAM: PORTABLE CHEST 1 VIEW COMPARISON:  07/24/2017 FINDINGS: Support devices are stable. Layering bilateral effusions, right greater than left with bilateral lower lobe atelectasis or  infiltrates, similar to prior study. Heart is borderline in size. IMPRESSION: Continued layering bilateral effusions and bibasilar atelectasis or infiltrates, right greater than left. No real change. Electronically Signed   By: Rolm Baptise M.D.   On: 07/25/2017 07:45   Dg Chest Port 1 View  Result Date: 07/24/2017 CLINICAL DATA:  History of endotracheal tube.  Respiratory failure. EXAM: PORTABLE CHEST 1 VIEW COMPARISON:  One-view chest x-ray 07/22/2017. FINDINGS: Endotracheal tube is stable, 5 cm above the carina. A right IJ line is stable. Right greater than left pleural effusions and associated airspace disease are similar to the prior study. The patient is rotated to the right. IMPRESSION: 1. Stable right greater than left pleural effusions and associated airspace disease, likely atelectasis. 2. Support apparatus is stable. Electronically Signed   By: San Morelle M.D.   On: 07/24/2017 07:10   Dg Chest Port 1 View  Result Date: 07/17/2017 CLINICAL DATA:  Status post intubation EXAM: PORTABLE CHEST 1 VIEW COMPARISON:  Film from earlier in the same day. FINDINGS: Cardiac shadow is stable. Endotracheal tube is now seen in satisfactory position. Right jugular central line is noted. Bilateral pleural effusions right greater than left are noted. Underlying atelectatic changes are noted on the right. No other focal abnormality is seen. IMPRESSION: Bilateral pleural effusions right greater than left. Right basilar atelectasis is seen. Tubes and lines as described. Electronically Signed   By: Inez Catalina M.D.   On: 07/17/2017 20:49   Dg Chest Port 1 View  Result Date: 07/10/2017 CLINICAL DATA:  Extubated this afternoon. Respiratory distress, abnormal breathing. EXAM: PORTABLE CHEST 1 VIEW COMPARISON:  Chest x-ray dated 07/22/2017 and chest x-ray dated 07/21/2017. FINDINGS: Endotracheal tube has been removed. Right IJ central line is stable in position with tip at the level of the lower SVC/  cavoatrial junction. Heart size is stable. Veiled opacity noted at the right lung base, likely layering pleural effusion. Left lung remains clear. No pneumothorax seen. IMPRESSION: 1. Veiled opacity at the right lung base, similar to the appearance on chest x-ray of 07/22/2017, likely small layering pleural effusion. Lungs otherwise clear. No pneumothorax seen. 2. Interval removal of the endotracheal tube. 3. Right IJ line remains well positioned with tip at the level of  the lower SVC/cavoatrial junction. Electronically Signed   By: Franki Cabot M.D.   On: 07/17/2017 18:37   Dg Abd Portable 1v  Result Date: 07/24/2017 CLINICAL DATA:  Nasogastric tube placement. EXAM: PORTABLE ABDOMEN - 1 VIEW COMPARISON:  None. FINDINGS: Nasogastric tube appears adequately positioned in the stomach, with proximal side holes approximately 5 cm distal to the expected location of the GE junction. Visualized bowel gas pattern is nonobstructive. Vague opacity at the right lung base is likely atelectasis and/or layering pleural effusion. IMPRESSION: 1. The nasogastric tube appears adequately positioned in the stomach. 2. Visualized bowel gas pattern is nonobstructive. 3. Opacity at the right lung base, likely atelectasis and/or layering pleural effusion. Electronically Signed   By: Franki Cabot M.D.   On: 07/24/2017 14:06    Cardiac Studies    Echo 07/19/2017  - Left ventricle: The cavity size was normal. There was moderate concentric hypertrophy. Systolic function was normal. The estimated ejection fraction was in the range of 60% to 65%. Wall motion was normal; there were no regional wall motion abnormalities. - Mitral valve: Calcified annulus. - Right ventricle: Systolic function was moderately reduced. - Tricuspid valve: There was mild regurgitation. - Pericardium, extracardiac: The pericardium appears thickened at the apex as well as the apical RV wall with small pericardial effusion over the  apex. Suggest cardiac MRI to evaluate pericardium further.    Cath 07/19/2017  Ost 1st Mrg lesion is 50% stenosed.  Ost Cx to Mid Cx lesion is 25% stenosed.  Prox LAD to Mid LAD lesion is 25% stenosed.  Ost RCA to Prox RCA lesion is 90% stenosed.  Mid RCA lesion is 99% stenosed.  Ost RPDA to RPDA lesion is 90% stenosed.  Previously placed Dist RCA stent (unknown type) is widely patent.  LV end diastolic pressure is normal.  There is no aortic valve stenosis.  Subtotally occluded RCA with thrombus, left to right collaterals. Continue medical therapy.   Coronary Diagrams   Diagnostic Diagram            Patient Profile     66 y.o. male with known CAD (previous RCA stent 2013) and PAD (AAA, aortobifem bypass 2016), hypertension presented with hypotension and bradycardia leading to syncope on November 11.  Found to have subtotal occlusion with heavy thrombus burden in the right coronary artery (with formed left to right collaterals), deemed best for medical therapy.  Developed transient atrial fibrillation, now resolved.  Pauses overnight. Also has right upper lobe and right lower lobe masses strongly suspicious for lung cancer    Assessment & Plan    1. NSTEMI: medical management, on IV heparin.  Would benefit from antiplatelet therapy with clopidogrel or Brilinta, but will hold off since there may be need for lung biopsy or other invasive procedures.  Preserved left ventricular systolic function. 2. AFib: Resolved, likely to recur, plan to discharge with oral anticoagulant.  No further bradycardia, will try to introduce a very low-dose of beta-blocker today. 3.  Bradycardia: May have been related to AV node ischemic injury, seems to have resolved.  . 4.  Lung masses: Highly suspicious for lung cancer.  Cytology results pending 5.  Acute respiratory failure excessively wean from ventilator 6.  PAD: Previous aortobifemoral bypass, with evidence of extensive  atherosclerosis involving the mid SMA artery and bilateral renal arteries.  Watch for evidence of mesenteric ischemia. 7.  Acute on chronic renal insufficiency: Creatinine peaked at 2.1 and is improving.  Baseline seems to be around 1.4-1.6.  It may still be tooearly to start RAAS inhibitors. Will DC HCTZ, start loop diuretic. Clinically he appears to be volume overloaded.  He weighs 10 pounds more than he did on November 12, 26 pounds more than the reported weight on admission November 11 (uncertain that this is accurate), 33 lb more than he did at appointment with Dr. Claiborne Billings in August. 8. Delirium: heavy alcohol use, but 7 days have passed since admission, could also be ICU delirium, hypoxic injury.    For questions or updates, please contact [place] Please consult [email or web address] for contact info under Cardiology/STEMI.      Signed, Sanda Klein, MD  07/25/2017, 10:15 AM

## 2017-07-25 NOTE — Progress Notes (Signed)
Attempt to wean this morning had poor results to this point. Patient had increase in BP to 364'B Systolic with rather increased agitation.

## 2017-07-25 NOTE — Progress Notes (Signed)
PULMONARY / CRITICAL CARE MEDICINE   Name: Adam Leino Sr. MRN: [id number] DOB: [birth year]    ADMISSION DATE:  07/29/2017 CONSULTATION DATE:  07/13/2017  REFERRING MD:  Dr. Tomi Bamberger  CHIEF COMPLAINT:  Chest Pain   HISTORY OF PRESENT ILLNESS:   66 year old male with PMH of CAD s/p RCA stent 2013, AAA s/p open repair in 2016 with aortiobifemoral bypass graft, HTN, HLD, ETOH  Presents to ED on 11/11 after wife found patient face down for unknown time. When EMS arrived patient complaining of central chest pain/syncope. Family states he took an unknown amount of nitro tablets. EMS gave 9 mcg EPI and 1 mg Atropine for Bradycardia and Hypotension. Upon arrival to ED patient was diaphoretic and required non-rebreather. Intubated in ED. ABG 7.353/25.5/282. ETOH 155. BP Systolic 07-37, on external pacer, underlying HR 15. Levophed gtt.  Cardiology consulted. PCCM asked to admit.    When looking at [facility] bottle, patient only missing 4 tablets (family states they are sure he took one on Halloween).  Family reports for the last few months patient has been complaining of progressive dyspnea, over the last couple of weeks intermittent chest pain. Per family patient drinks heavily.     SUBJECTIVE:  Extubated this am > tolerating well from respiratory standpoint.  Agitated delirium, paranoid.  Improved with ativan 65m.     VITAL SIGNS: BP (!) 163/108   Pulse 84   Temp 98.1 F (36.7 C)   Resp (!) 25   Ht _0  (1.778 m)   Wt 166 lb 7.2 oz (75.5 kg)   SpO2 100%   BMI 23.88 kg/m   HEMODYNAMICS: CVP:  [12 mmHg-17 mmHg] 15 mmHg  VENTILATOR SETTINGS: Vent Mode: PRVC FiO2 (%):  [40 %] 40 % Set Rate:  [14 bmp] 14 bmp Vt Set:  [580 mL] 580 mL PEEP:  [5 cmH20] 5 cmH20 Plateau Pressure:  [15 cmH20-18 cmH20] 17 cmH20  INTAKE / OUTPUT: I/O last 3 completed shifts: In: 1787.5 [I.V.:1437.5; NG/GT:150; IV Piggyback:200] Out: 11062[Urine:1470; Emesis/NG output:280]  PHYSICAL  EXAMINATION: General: thin adult male in NAD HEENT: MM pink/moist, hoarse voice, speech clear Neuro: Awake, oriented to self, speech clear, MAE, hallucinations at times CV: s1s2 rrr, no m/r/g PULM: even/non-labored, lungs bilaterally coarse  GIR:SWNI non-tender, bsx4 active  Extremities: warm/dry, no edema  Skin: no rashes or lesions   LABS:  BMET Recent Labs  Lab 07/28/2017 2034 07/24/17 0432 07/25/17 0414  NA 133* 134* 136  K 4.2 3.9 4.0  CL 101 103 106  CO2 19* 20* 22  BUN 27* 30* 42*  CREATININE 2.09* 1.90* 1.72*  GLUCOSE 114* 132* 130*   Electrolytes Recent Labs  Lab 07/20/17 1640  08/01/2017 0053  07/22/2017 2034 07/24/17 0432 07/25/17 0414  CALCIUM  --    < > 7.8*   < > 8.2* 8.2* 8.4*  MG 1.9  --  1.6*  --   --  1.9  --   PHOS 3.0  --  2.7  --   --  4.8*  --    < > = values in this interval not displayed.   CBC Recent Labs  Lab 08/05/2017 1030 07/24/17 0432 07/25/17 0414  WBC 10.8* 8.8 8.9  HGB 10.2* 10.4* 9.7*  HCT 32.6* 33.0* 30.0*  PLT 215 223 254   Coag's Recent Labs  Lab 07/24/2017 1855 07/19/2017 2229 07/21/17 1045  APTT  --  52* 88*  INR 1.15  --  1.17   Sepsis Markers  Recent Labs  Lab 07/29/2017 2229  07/19/17 0447 07/20/17 0313 07/20/17 1102 07/24/17 0432  LATICACIDVEN  --    < > 3.4*  --  1.1 1.3  PROCALCITON <0.10  --  1.10 1.46  --   --    < > = values in this interval not displayed.   ABG Recent Labs  Lab 07/14/2017 1812 07/13/2017 2133 07/24/17 0440  PHART 7.326* 7.332* 7.415  PCO2ART 42.7 43.2 32.4  PO2ART 296.0* 358.0* 97.2   Liver Enzymes Recent Labs  Lab 07/21/17 0500 07/24/17 0432 07/25/17 0414  AST 1,616* 131* 63*  ALT 1,413* 457* 287*  ALKPHOS 163* 137* 110  BILITOT 1.8* 0.8 0.7  ALBUMIN 2.3* 2.2* 2.2*   Cardiac Enzymes Recent Labs  Lab 07/20/17 0900 07/20/17 1640 07/13/2017 2034  TROPONINI 29.86* 24.97* 4.04*   Glucose Recent Labs  Lab 07/24/17 1522 07/24/17 1956 07/25/17 0040 07/25/17 0426  07/25/17 0724 07/25/17 1109  GLUCAP 133* 128* 115* 132* 131* 143*   Imaging Dg Chest Port 1 View  Result Date: 07/25/2017 CLINICAL DATA:  Acute respiratory failure with hypoxia EXAM: PORTABLE CHEST 1 VIEW COMPARISON:  07/24/2017 FINDINGS: Support devices are stable. Layering bilateral effusions, right greater than left with bilateral lower lobe atelectasis or infiltrates, similar to prior study. Heart is borderline in size. IMPRESSION: Continued layering bilateral effusions and bibasilar atelectasis or infiltrates, right greater than left. No real change. Electronically Signed   By: Rolm Baptise M.D.   On: 07/25/2017 07:45   Dg Abd Portable 1v  Result Date: 07/24/2017 CLINICAL DATA:  Nasogastric tube placement. EXAM: PORTABLE ABDOMEN - 1 VIEW COMPARISON:  None. FINDINGS: Nasogastric tube appears adequately positioned in the stomach, with proximal side holes approximately 5 cm distal to the expected location of the GE junction. Visualized bowel gas pattern is nonobstructive. Vague opacity at the right lung base is likely atelectasis and/or layering pleural effusion. IMPRESSION: 1. The nasogastric tube appears adequately positioned in the stomach. 2. Visualized bowel gas pattern is nonobstructive. 3. Opacity at the right lung base, likely atelectasis and/or layering pleural effusion. Electronically Signed   By: Franki Cabot M.D.   On: 07/24/2017 14:06    STUDIES:  CTA Chest 11/11 >> 2.9 cm right upper lobe cavitary spiculated nodule and 3.7 cm right lower lobe mass. Findings probably represent synchronous or primary/metastatic lung cancer. Distant metastatic disease is less likely. The right upper lobe lesion invades the hilum. There is a satellite nodule inferior to the right lower lobe lesion. Sub- carinal lymphadenopathy. No aortic dissection. Left common carotid artery origin fibrofatty plaque with moderate 50% stenosis. Cardiomegaly and severe coronary artery calcification.  CT A/P 11/11 >>  Diffuse gallbladder wall thickening & extensive inflammation surrounding the pancreas. Inflammation is likely due to acute pancreatitis with either reactive changes of the gallbladder or concurrent acute cholecystitis. No aortic dissection. Infrarenal abdominal aortic repair with patent bilateral aortofemoral bypass. Extensive atherosclerotic disease with severe proximal femoral artery stenosis, severe mid SMA stenosis, severe bilateral renal artery stenosis. Layering density within the gallbladder may represent small stones or vicarious excretion of contrast.  Small volume of ascites. [place] ABD 11/13 >> Three liver masses measuring up to 3.2 cm, favor metastatic disease given chest CT findings yesterday. Cholelithiasis. No visible choledocholithiasis or common bile duct dilatation.Prominent gallbladder wall edema. This could be reactive rather than cholecystitis given the gallbladder is not over dilated [facility] 11/16 >> multivessel disease (25-90%), occluded RCA with thrombus, left to right collaterals, LVEDP normal, no AV stensois  CULTURES: U/A 11/11 >> Negative  Blood Culture 11/11 >> negative Sputum11/11 >> normal flora BAL 11/15 >> 100 colonies mold >> ? Contaminant  BAL 11/15 >> stenotrophomonas maltophilia >>   CYTOLOGY  FOB 11/15 >> no malignant cells identified, acute inflammation   ANTIBIOTICS: Vancomycin 11/11 >> 11/12,11/13 >> 11/14 Zosyn 11/11 >> 1/15 Ceftriaxone 11/15 >> 11/18 Levaquin 11/17 >>   SIGNIFICANT EVENTS: 11/11  Admitted with syncope, chest pain, brady/hypotensive, externally paced, pressors, intubated 11/12  Shock, brady, vagal 11/15  Bronch   LINES/TUBES: ETT 11/11 >> 11/16, 11/16 >> Right IJ 11/11 >>  R Radial Aline 11/11 >>  DISCUSSION: 66 year old male admitted 11/11 with chest pain, SOB.  Found to be hypoxic, hypotensive, and bradycardiac. Intubated in ED. Cardiology consulted.  Chest imaging concerning for malignancy with mets.  Pathology pending.    ASSESSMENT / PLAN:  PULMONARY A: Acute Hypoxic Respiratory Failure  Failed Extubation 11/16 - stridor, poor effort  RUL Cavitary Spiculated  Nodule - CT Chest with 2.9 cm right upper lobe cavitary spiculated nodule and 3.7 cm right lower lobe mass highly suspicious for malignancy with mets H/O Tobacco Use  Pulmonary Edema  Stenotrophomonas in Sputum  P:   Pulmonary hygiene - IS, mobilize Duoneb Q6 + PRN  Completed solumedrol for stridor  Follow intermittent CXR  FOB pathology negative, will need additional procedure for tissue sampling given CT appearance   CARDIOVASCULAR A:  Bradycardia - ? AV nodal injury NSTEMI Septic vs Cardiogenic Shock, acidosis related likely  CAD s/p RCA stent 2013, AAA repair 2016 PAD AF - resolved, cards expects to reoccur  H/O HTN, HLD  P:  Tele monitoring  ASA Continue heparin gtt per pharmacy > defer duration to Cardiology  Coreg 6.26 mg BID, amlodipine 25m QD Cardiology following, appreciate input  Atropine PRN for bradycardia  PRN hydralazine for SBP > 170 Change CVP to Qshift  RENAL A:   Acute on Chronic Renal Insufficiency - improving   P:   Lasix 40 mg IV x1 Trend BMP / urinary output Replace electrolytes as indicated Avoid nephrotoxic agents, ensure adequate renal perfusion  GASTROINTESTINAL A:   Transaminase elevation - resolving shock liver, MI contribution, UKoreagallbladder negative. AFP wnl. Met liver, primary lung? - FOB pathology negative, will need additional procedure P:   NPO SLP evaluation for swallowing   HEMATOLOGIC A:   VTE Prophylaxis  Anemia P:  Trend CBC  Heparin gtt per Cardiology   INFECTIOUS A:   Fever  Stenotrophomonas in Sputum  P:   Discontinue rocephin  Levaquin for steno Follow cultures as above   ENDOCRINE A:   Hyperglycemia   HIGH cortisol response-poor outcome>? P:   CBG with SSI   NEUROLOGIC A:   Acute Metabolic Encephalopathy  - resolved H/O ETOH - ETOH 155  P:   RASS  goal: 0 to -1  CIWA protocol   Avoiding precedex for now given bradycardia on admit  Continue folate / thiamine  Add seroquel 50 mg BID   FAMILY  - Updates: No family at bedside on NP rounding.  Plan of care coordinated with bedside RN.     CC Time: 35 minutes.  BNoe Gens NP-C [facility] Pgr: [phone] or if no answer [phone]/18/2018, 11:37 AM

## 2017-07-25 NOTE — Progress Notes (Addendum)
Called to bedside by RN.  Patient agitated, paranoid, hallucinating (seeing fires).  He had pulled his R radial aline out.  Pressure held to site with hemostasis achieved.  2mg  IV ativan given x1 with improvement.  Not clear that this is ETOH withdrawal given D7 of hospitalization.  No benzo's listed on home medication list.   Plan:  Add SDU CIWA protocol  (avoid precedex given bradycardia on admit) Monitor closely  Noe Gens, NP-C [facility] Pgr: [phone] or if no answer [phone] 07/25/2017, 10:46 AM

## 2017-07-25 NOTE — Progress Notes (Signed)
Patient reassessed on PSV wean 5/5 > calm, pulling Vt of 600-1.4L.  Positive cuff leak.  Able to follow commands appropriately.  Strong cough post extubation.  Will continue to monitor.  BP noted to be elevated this am and prior to extubation.  He heard the providers discussing it and post extubation reports he takes lisinopril for his blood pressure.  He also states he drinks and sometimes "gets the shakes" when he doesn't.   Plan: Extubate to [place] O2  Supportive care  Aspiration precautions  NPO  SLP evaluation for swallowing  PRN hydralazine for SBP >170  PRN ativan (low dose post extubation) for anxiety May need CIWA > monitor for now  Noe Gens, NP-C [facility] Pgr: [phone] or if no answer [phone] 07/25/2017, 9:54 AM

## 2017-07-26 ENCOUNTER — Inpatient Hospital Stay (HOSPITAL_COMMUNITY): Payer: Medicare HMO

## 2017-07-26 DIAGNOSIS — N183 Chronic kidney disease, stage 3 (moderate): Secondary | ICD-10-CM

## 2017-07-26 DIAGNOSIS — J9601 Acute respiratory failure with hypoxia: Secondary | ICD-10-CM

## 2017-07-26 DIAGNOSIS — I10 Essential (primary) hypertension: Secondary | ICD-10-CM

## 2017-07-26 DIAGNOSIS — F10931 Alcohol use, unspecified with withdrawal delirium: Secondary | ICD-10-CM

## 2017-07-26 DIAGNOSIS — F10231 Alcohol dependence with withdrawal delirium: Secondary | ICD-10-CM

## 2017-07-26 DIAGNOSIS — E7800 Pure hypercholesterolemia, unspecified: Secondary | ICD-10-CM

## 2017-07-26 DIAGNOSIS — N179 Acute kidney failure, unspecified: Secondary | ICD-10-CM

## 2017-07-26 DIAGNOSIS — I469 Cardiac arrest, cause unspecified: Secondary | ICD-10-CM

## 2017-07-26 LAB — BASIC METABOLIC PANEL
Anion gap: 9 (ref 5–15)
BUN: 46 mg/dL — AB (ref 6–20)
CHLORIDE: 107 mmol/L (ref 101–111)
CO2: 23 mmol/L (ref 22–32)
CREATININE: 1.69 mg/dL — AB (ref 0.61–1.24)
Calcium: 8.6 mg/dL — ABNORMAL LOW (ref 8.9–10.3)
GFR calc Af Amer: 47 mL/min — ABNORMAL LOW (ref >60)
GFR calc non Af Amer: 41 mL/min — ABNORMAL LOW (ref >60)
GLUCOSE: 110 mg/dL — AB (ref 65–99)
POTASSIUM: 3.5 mmol/L (ref 3.5–5.1)
SODIUM: 139 mmol/L (ref 135–145)

## 2017-07-26 LAB — GLUCOSE, CAPILLARY
GLUCOSE-CAPILLARY: 102 mg/dL — AB (ref 65–99)
GLUCOSE-CAPILLARY: 107 mg/dL — AB (ref 65–99)
GLUCOSE-CAPILLARY: 111 mg/dL — AB (ref 65–99)
GLUCOSE-CAPILLARY: 120 mg/dL — AB (ref 65–99)
GLUCOSE-CAPILLARY: 92 mg/dL (ref 65–99)
Glucose-Capillary: 104 mg/dL — ABNORMAL HIGH (ref 65–99)
Glucose-Capillary: 94 mg/dL (ref 65–99)
Glucose-Capillary: 97 mg/dL (ref 65–99)

## 2017-07-26 LAB — HEPARIN LEVEL (UNFRACTIONATED): Heparin Unfractionated: 0.47 IU/mL (ref 0.30–0.70)

## 2017-07-26 LAB — CULTURE, BAL-QUANTITATIVE

## 2017-07-26 LAB — CBC
HCT: 29.6 % — ABNORMAL LOW (ref 39.0–52.0)
HEMOGLOBIN: 9.5 g/dL — AB (ref 13.0–17.0)
MCH: 31 pg (ref 26.0–34.0)
MCHC: 32.1 g/dL (ref 30.0–36.0)
MCV: 96.7 fL (ref 78.0–100.0)
Platelets: 281 10*3/uL (ref 150–400)
RBC: 3.06 MIL/uL — ABNORMAL LOW (ref 4.22–5.81)
RDW: 14.2 % (ref 11.5–15.5)
WBC: 7.7 10*3/uL (ref 4.0–10.5)

## 2017-07-26 LAB — POCT I-STAT 3, ART BLOOD GAS (G3+)
ACID-BASE EXCESS: 2 mmol/L (ref 0.0–2.0)
BICARBONATE: 24.6 mmol/L (ref 20.0–28.0)
O2 SAT: 95 %
PCO2 ART: 31.7 mmHg — AB (ref 32.0–48.0)
PO2 ART: 67 mmHg — AB (ref 83.0–108.0)
TCO2: 26 mmol/L (ref 22–32)
pH, Arterial: 7.497 — ABNORMAL HIGH (ref 7.350–7.450)

## 2017-07-26 LAB — CULTURE, BAL-QUANTITATIVE W GRAM STAIN: Culture: 100 — AB

## 2017-07-26 MED ORDER — POTASSIUM CHLORIDE 20 MEQ/15ML (10%) PO SOLN
40.0000 meq | Freq: Once | ORAL | Status: AC
  Administered 2017-07-26: 40 meq
  Filled 2017-07-26: qty 30

## 2017-07-26 MED ORDER — DEXAMETHASONE SODIUM PHOSPHATE 10 MG/ML IJ SOLN
6.0000 mg | Freq: Four times a day (QID) | INTRAMUSCULAR | Status: AC
  Administered 2017-07-26 – 2017-07-28 (×8): 6 mg via INTRAVENOUS
  Filled 2017-07-26 (×8): qty 0.6

## 2017-07-26 MED ORDER — ALBUTEROL SULFATE (2.5 MG/3ML) 0.083% IN NEBU
2.5000 mg | INHALATION_SOLUTION | RESPIRATORY_TRACT | Status: AC | PRN
  Administered 2017-07-26: 2.5 mg via RESPIRATORY_TRACT
  Filled 2017-07-26: qty 3

## 2017-07-26 MED ORDER — CLONAZEPAM 0.1 MG/ML ORAL SUSPENSION
1.0000 mg | Freq: Two times a day (BID) | ORAL | Status: DC
  Filled 2017-07-26: qty 10

## 2017-07-26 MED ORDER — CLONAZEPAM 1 MG PO TABS
1.0000 mg | ORAL_TABLET | Freq: Two times a day (BID) | ORAL | Status: DC
  Administered 2017-07-26 – 2017-08-02 (×12): 1 mg
  Filled 2017-07-26 (×15): qty 1

## 2017-07-26 MED ORDER — RACEPINEPHRINE HCL 2.25 % IN NEBU
INHALATION_SOLUTION | RESPIRATORY_TRACT | Status: AC
  Filled 2017-07-26: qty 0.5

## 2017-07-26 MED ORDER — RACEPINEPHRINE HCL 2.25 % IN NEBU
0.5000 mL | INHALATION_SOLUTION | Freq: Once | RESPIRATORY_TRACT | Status: AC
  Administered 2017-07-26: 0.5 mL via RESPIRATORY_TRACT

## 2017-07-26 MED ORDER — IPRATROPIUM-ALBUTEROL 0.5-2.5 (3) MG/3ML IN SOLN
3.0000 mL | Freq: Four times a day (QID) | RESPIRATORY_TRACT | Status: DC
Start: 2017-07-26 — End: 2017-07-26

## 2017-07-26 MED ORDER — VITAL AF 1.2 CAL PO LIQD
1000.0000 mL | ORAL | Status: DC
  Administered 2017-07-26 – 2017-08-02 (×5): 1000 mL
  Filled 2017-07-26 (×11): qty 1000

## 2017-07-26 MED ORDER — AMLODIPINE BESYLATE 10 MG PO TABS
10.0000 mg | ORAL_TABLET | Freq: Every day | ORAL | Status: DC
  Administered 2017-07-26 – 2017-08-02 (×6): 10 mg via ORAL
  Filled 2017-07-26 (×9): qty 1

## 2017-07-26 MED ORDER — CLONAZEPAM 1 MG PO TABS: 1.0000 mg | ORAL_TABLET | Freq: Two times a day (BID) | ORAL | Status: DC

## 2017-07-26 NOTE — Progress Notes (Deleted)
Called to patient bedside for increase agitation.   Patient was on TC > Placed back on vent. EKG with QTC of 501. Remained agitated requiring Restart of Versed and Fentanyl Gtt. > Patient now appears calm. Will continue Fentanyl/Versed gtt for RASS of 0/-1.   [name], AGACNP-BC [facility]  Pgr: [phone]  PCCM Pgr: [phone]

## 2017-07-26 NOTE — Progress Notes (Signed)
Patient still has increased work of breathing. Family informed and answered all questions. Paul,CCM NP,  Informed again of increased work of breathing even after Nasal suctioning and PRN medications. Will continue to monitor.

## 2017-07-26 NOTE — Progress Notes (Signed)
Progress Note  Patient Name: Adam Rotan Sr. Date of Encounter: 07/26/2017  Primary Cardiologist: Dr. Claiborne Billings  Subjective   Denies chest pain or shortness of breath.    Inpatient Medications    Scheduled Meds: . amLODipine  5 mg Oral Daily  . aspirin  81 mg Oral Daily  . carvedilol  6.25 mg Oral BID WC  . folic acid  1 mg Intravenous Daily  . furosemide  40 mg Intravenous Daily  . ipratropium-albuterol  3 mL Nebulization Q6H  . lactulose  30 g Oral Daily  . mouth rinse  15 mL Mouth Rinse q12n4p  . mouth rinse  15 mL Mouth Rinse q12n4p  . pantoprazole sodium  40 mg Per Tube QHS  . QUEtiapine  50 mg Oral BID  . sodium chloride flush  10-40 mL Intracatheter Q12H  . thiamine injection  100 mg Intravenous Daily   Continuous Infusions: . sodium chloride Stopped (07/21/17 1607)  . sodium chloride    . heparin 1,200 Units/hr (07/26/17 0744)  . levofloxacin (LEVAQUIN) IV 750 mg (07/24/17 1652)   PRN Meds: sodium chloride, Place/Maintain arterial line **AND** sodium chloride, atropine, hydrALAZINE, ipratropium-albuterol, LORazepam, sodium chloride flush   Vital Signs    Vitals:   07/26/17 0400 07/26/17 0401 07/26/17 0500 07/26/17 0600  BP:  (!) 160/112 (!) 154/89 (!) 161/84  Pulse: 85 85 84 89  Resp: (!) 24 (!) 26 (!) 22 (!) 24  Temp: 97.7 F (36.5 C) 97.7 F (36.5 C) 98.2 F (36.8 C) 97.7 F (36.5 C)  TempSrc:      SpO2: 100% 100% 100% 100%  Weight:  74.2 kg (163 lb 9.3 oz)    Height:        Intake/Output Summary (Last 24 hours) at 07/26/2017 0801 Last data filed at 07/26/2017 0600 Gross per 24 hour  Intake 583.53 ml  Output 1150 ml  Net -566.47 ml   Filed Weights   07/24/17 0448 07/25/17 0429 07/26/17 0401  Weight: 73.6 kg (162 lb 4.1 oz) 75.5 kg (166 lb 7.2 oz) 74.2 kg (163 lb 9.3 oz)    Telemetry    Sinus rhythm.  Frequent PVCs and PACs.- Personally Reviewed  ECG    n/a - Personally Reviewed  Physical Exam   VS:  BP (!) 149/84 (BP  Location: Left Arm)   Pulse 88   Temp 98.6 F (37 C) (Core (Comment)) Comment (Src): foley   Resp (!) 30   Ht 5\' 10"  (1.778 m)   Wt 74.2 kg (163 lb 9.3 oz)   SpO2 100%   BMI 23.47 kg/m  , BMI Body mass index is 23.47 kg/m. GENERAL: Chronically ill-appearing.  Very weak and unable to sit up.  HEENT: Pupils equal round and reactive, fundi not visualized, oral mucosa unremarkable NECK:  JVD 2 cm above the clavicle at 45 degrees.  Waveform within normal limits, carotid upstroke brisk and symmetric, no bruits, no thyromegaly LUNGS: Crackles at the right base.  No wheezes or rhonchi.   HEART:  RRR.  PMI not displaced or sustained,S1 and S2 within normal limits, no S3, no S4, no clicks, no rubs, no murmurs ABD:  Flat, positive bowel sounds normal in frequency in pitch, no bruits, no rebound, no guarding, no midline pulsatile mass, no hepatomegaly, no splenomegaly EXT:  2 plus pulses throughout, no edema, no cyanosis no clubbing SKIN:  No rashes no nodules NEURO:  Cranial nerves II through XII grossly intact, motor grossly intact throughout PSYCH: Confused.  Labs    Chemistry Recent Labs  Lab 07/21/17 0500  07/24/17 0432 07/25/17 0414 07/26/17 0449  NA 132*   < > 134* 136 139  K 3.8   < > 3.9 4.0 3.5  CL 101   < > 103 106 107  CO2 24   < > 20* 22 23  GLUCOSE 79   < > 132* 130* 110*  BUN 19   < > 30* 42* 46*  CREATININE 1.63*   < > 1.90* 1.72* 1.69*  CALCIUM 7.3*   < > 8.2* 8.4* 8.6*  PROT 5.1*  --  5.7* 5.4*  --   ALBUMIN 2.3*  --  2.2* 2.2*  --   AST 1,616*  --  131* 63*  --   ALT 1,413*  --  457* 287*  --   ALKPHOS 163*  --  137* 110  --   BILITOT 1.8*  --  0.8 0.7  --   GFRNONAA 42*   < > 35* 40* 41*  GFRAA 49*   < > 41* 46* 47*  ANIONGAP 7   < > 11 8 9    < > = values in this interval not displayed.     Hematology Recent Labs  Lab 07/24/17 0432 07/25/17 0414 07/26/17 0449  WBC 8.8 8.9 7.7  RBC 3.41* 3.08* 3.06*  HGB 10.4* 9.7* 9.5*  HCT 33.0* 30.0* 29.6*    MCV 96.8 97.4 96.7  MCH 30.5 31.5 31.0  MCHC 31.5 32.3 32.1  RDW 13.8 14.3 14.2  PLT 223 254 281    Cardiac Enzymes Recent Labs  Lab 07/20/17 0313 07/20/17 0900 07/20/17 1640 08/05/2017 2034  TROPONINI 27.43* 29.86* 24.97* 4.04*   No results for input(s): TROPIPOC in the last 168 hours.   BNP Recent Labs  Lab 08/05/2017 2034 07/24/17 0432  BNP 4,018.6* 3,789.9*     DDimer  Recent Labs  Lab 07/20/17 0313  DDIMER >20.00*     Radiology    Dg Chest Port 1 View  Result Date: 07/26/2017 CLINICAL DATA:  Respiratory failure.  Status postextubation EXAM: PORTABLE CHEST 1 VIEW COMPARISON:  July 25, 2017 FINDINGS: Endotracheal tube no longer apparent. Nasogastric tube tip not seen. Nasogastric tube side port appears at the gastroesophageal junction. No pneumothorax. There are pleural effusions bilaterally, larger on the right than on the left, with bibasilar atelectasis. Heart is mildly enlarged with pulmonary venous hypertension. No adenopathy. There is aortic atherosclerosis. No evident bone lesions. IMPRESSION: Tube and catheter positions as described without pneumothorax. Note that the nasogastric tube side port is at the gastroesophageal junction. Advise advancing nasogastric tube 6-8 cm to confirm both nasogastric tube and side port well below the diaphragm. Bilateral pleural effusions with bibasilar atelectasis persist. Effusion larger on the right than on the left, stable. Stable pulmonary vascular congestion. There is aortic atherosclerosis. Aortic Atherosclerosis (ICD10-I70.0). Electronically Signed   By: Lowella Grip III M.D.   On: 07/26/2017 07:23   Dg Chest Port 1 View  Result Date: 07/25/2017 CLINICAL DATA:  Acute respiratory failure with hypoxia EXAM: PORTABLE CHEST 1 VIEW COMPARISON:  07/24/2017 FINDINGS: Support devices are stable. Layering bilateral effusions, right greater than left with bilateral lower lobe atelectasis or infiltrates, similar to prior study.  Heart is borderline in size. IMPRESSION: Continued layering bilateral effusions and bibasilar atelectasis or infiltrates, right greater than left. No real change. Electronically Signed   By: Rolm Baptise M.D.   On: 07/25/2017 07:45   Dg Abd Portable 1v  Result  Date: 07/24/2017 CLINICAL DATA:  Nasogastric tube placement. EXAM: PORTABLE ABDOMEN - 1 VIEW COMPARISON:  None. FINDINGS: Nasogastric tube appears adequately positioned in the stomach, with proximal side holes approximately 5 cm distal to the expected location of the GE junction. Visualized bowel gas pattern is nonobstructive. Vague opacity at the right lung base is likely atelectasis and/or layering pleural effusion. IMPRESSION: 1. The nasogastric tube appears adequately positioned in the stomach. 2. Visualized bowel gas pattern is nonobstructive. 3. Opacity at the right lung base, likely atelectasis and/or layering pleural effusion. Electronically Signed   By: Franki Cabot M.D.   On: 07/24/2017 14:06    Cardiac Studies   Echo 07/19/2017   - Left ventricle: The cavity size was normal. There was moderate concentric hypertrophy. Systolic function was normal. The estimated ejection fraction was in the range of 60% to 65%. Wall motion was normal; there were no regional wall motion abnormalities. - Mitral valve: Calcified annulus. - Right ventricle: Systolic function was moderately reduced. - Tricuspid valve: There was mild regurgitation. - Pericardium, extracardiac: The pericardium appears thickened at the apex as well as the apical RV wall with small pericardial effusion over the apex. Suggest cardiac MRI to evaluate pericardium further.    Cath 07/31/2017  Ost 1st Mrg lesion is 50% stenosed.  Ost Cx to Mid Cx lesion is 25% stenosed.  Prox LAD to Mid LAD lesion is 25% stenosed.  Ost RCA to Prox RCA lesion is 90% stenosed.  Mid RCA lesion is 99% stenosed.  Ost RPDA to RPDA lesion is 90%  stenosed.  Previously placed Dist RCA stent (unknown type) is widely patent.  LV end diastolic pressure is normal.  There is no aortic valve stenosis.  Subtotally occluded RCA with thrombus, left to right collaterals. Continue medical therapy.   Coronary Diagrams   Diagnostic Diagram           Patient Profile     66 y.o. male with CAD s/p RCA PCI in 2013, PAD (AAA, aortobifem bypass in 2016), hypertension and bradycardia admitted 11/11 with syncope.  Underwent LHC and found to have subtotal occlusion with heavy thrombus burden in the right coronary artery (with formed left to right collaterals), deemed best for medical therapy.He also had hypoxic respiratory failure, extubated 11/18.  Assessment & Plan    # NSTEMI:  Troponin peaked at 29.  Left heart catheterization revealed 50% OM1, 25% left circumflex, 25% proximal LAD, 90% ostial to proximal RCA, 99% mid RCA, 90% R PDA.  There were also left to right collaterals.  Medical management was recommended.    Holding antiplatelets for possible lung biopsy.  Continue  aspirin, carvedilol.  Will switch to clopidogrel and Eliquis once he is done with invasive procedures.  He has a history of alcohol abuse and LFTs were elevated this admission.  We will try a statin once stable.  # Syncope: No recurrent events since admission.  PACs and PVCs noted on telemetry with short runs of NSVT.  # PAF: Resolved but likely to recur.  Plan to start anticoagulation when done with invasive testing.  # Hypertension:  Blood pressure has been consistently above goal.  He had some bradycardia earlier this admission so we will not titrate carvedilol at this time.  Increase amlodipine to 10 mg daily.    # R UL/LL mass: Suspicious for lung cancer. ?biopsy this admission  # Acute on chronic renal failure: Improving.  Creatinine down to 1.69 today.  Baseline is 1.4.   For questions  or updates, please contact [place] Please consult  [email or web address] for contact info under Cardiology/STEMI.      Signed, Skeet Latch, MD  07/26/2017, 8:01 AM

## 2017-07-26 NOTE — Progress Notes (Signed)
PULMONARY / CRITICAL CARE MEDICINE   Name: Adam Irani Sr. MRN: [id number] DOB: [birth year]    ADMISSION DATE:  08/01/2017 CONSULTATION DATE:  08/03/2017  REFERRING MD:  Dr. Tomi Bamberger  CHIEF COMPLAINT:  Chest Pain   HISTORY OF PRESENT ILLNESS:   66 year old male with PMH of CAD s/p RCA stent 2013, AAA s/p open repair in 2016 with aortiobifemoral bypass graft, HTN, HLD, ETOH  Presents to ED on 11/11 after wife found patient face down for unknown time. When EMS arrived patient complaining of central chest pain/syncope. Family states he took an unknown amount of nitro tablets. EMS gave 9 mcg EPI and 1 mg Atropine for Bradycardia and Hypotension. Upon arrival to ED patient was diaphoretic and required non-rebreather. Intubated in ED. ABG 7.353/25.5/282. ETOH 155. BP Systolic 46-56, on external pacer, underlying HR 15. Levophed gtt.  Cardiology consulted. PCCM asked to admit.    When looking at [facility] bottle, patient only missing 4 tablets (family states they are sure he took one on Halloween).  Family reports for the last few months patient has been complaining of progressive dyspnea, over the last couple of weeks intermittent chest pain. Per family patient drinks heavily.     SUBJECTIVE:  Very agitated overnight, required multiple doses of ativan, sedate this AM, needing ativan on an hourly bases  VITAL SIGNS: BP (!) 144/74   Pulse 83   Temp 98.4 F (36.9 C)   Resp (!) 28   Ht _0  (1.778 m)   Wt 74.2 kg (163 lb 9.3 oz)   SpO2 100%   BMI 23.47 kg/m   HEMODYNAMICS: CVP:  [9 mmHg-12 mmHg] 9 mmHg  VENTILATOR SETTINGS: FiO2 (%):  [28 %] 28 %  INTAKE / OUTPUT: I/O last 3 completed shifts: In: 1031 [I.V.:861; NG/GT:120; IV Piggyback:50] Out: 8127 [Urine:1365; Emesis/NG output:405]  PHYSICAL EXAMINATION: General: Acute on chronically ill appearing male, NAD, agitated HEENT: [place]/AT, PERRL, EOM-I and MMM Neuro: Confused, moving all ext to pain not command CV: RRR, Nl S1/S2,  -M/R/G. PULM: Upper airway sounds transmitted GI: Soft, NT, ND and +BS Extremities: Warm/dry, no edema  Skin: No rashes or lesions  LABS:  BMET Recent Labs  Lab 07/24/17 0432 07/25/17 0414 07/26/17 0449  NA 134* 136 139  K 3.9 4.0 3.5  CL 103 106 107  CO2 20* 22 23  BUN 30* 42* 46*  CREATININE 1.90* 1.72* 1.69*  GLUCOSE 132* 130* 110*   Electrolytes Recent Labs  Lab 07/20/17 1640  07/30/2017 0053  07/24/17 0432 07/25/17 0414 07/26/17 0449  CALCIUM  --    < > 7.8*   < > 8.2* 8.4* 8.6*  MG 1.9  --  1.6*  --  1.9  --   --   PHOS 3.0  --  2.7  --  4.8*  --   --    < > = values in this interval not displayed.   CBC Recent Labs  Lab 07/24/17 0432 07/25/17 0414 07/26/17 0449  WBC 8.8 8.9 7.7  HGB 10.4* 9.7* 9.5*  HCT 33.0* 30.0* 29.6*  PLT 223 254 281   Coag's Recent Labs  Lab 07/21/17 1045  APTT 88*  INR 1.17   Sepsis Markers Recent Labs  Lab 07/20/17 0313 07/20/17 1102 07/24/17 0432  LATICACIDVEN  --  1.1 1.3  PROCALCITON 1.46  --   --    ABG Recent Labs  Lab 07/15/2017 1812 07/17/2017 2133 07/24/17 0440  PHART 7.326* 7.332* 7.415  PCO2ART 42.7 43.2  32.4  PO2ART 296.0* 358.0* 97.2   Liver Enzymes Recent Labs  Lab 07/21/17 0500 07/24/17 0432 07/25/17 0414  AST 1,616* 131* 63*  ALT 1,413* 457* 287*  ALKPHOS 163* 137* 110  BILITOT 1.8* 0.8 0.7  ALBUMIN 2.3* 2.2* 2.2*   Cardiac Enzymes Recent Labs  Lab 07/20/17 0900 07/20/17 1640 08/01/2017 2034  TROPONINI 29.86* 24.97* 4.04*   Glucose Recent Labs  Lab 07/25/17 1109 07/25/17 1509 07/25/17 1950 07/26/17 0005 07/26/17 0400 07/26/17 0843  GLUCAP 143* 140* 103* 102* 104* 92   Imaging Dg Chest Port 1 View  Result Date: 07/26/2017 CLINICAL DATA:  Respiratory failure.  Status postextubation EXAM: PORTABLE CHEST 1 VIEW COMPARISON:  July 25, 2017 FINDINGS: Endotracheal tube no longer apparent. Nasogastric tube tip not seen. Nasogastric tube side port appears at the gastroesophageal  junction. No pneumothorax. There are pleural effusions bilaterally, larger on the right than on the left, with bibasilar atelectasis. Heart is mildly enlarged with pulmonary venous hypertension. No adenopathy. There is aortic atherosclerosis. No evident bone lesions. IMPRESSION: Tube and catheter positions as described without pneumothorax. Note that the nasogastric tube side port is at the gastroesophageal junction. Advise advancing nasogastric tube 6-8 cm to confirm both nasogastric tube and side port well below the diaphragm. Bilateral pleural effusions with bibasilar atelectasis persist. Effusion larger on the right than on the left, stable. Stable pulmonary vascular congestion. There is aortic atherosclerosis. Aortic Atherosclerosis (ICD10-I70.0). Electronically Signed   By: Lowella Grip III M.D.   On: 07/26/2017 07:23   STUDIES:  CTA Chest 11/11 >> 2.9 cm right upper lobe cavitary spiculated nodule and 3.7 cm right lower lobe mass. Findings probably represent synchronous or primary/metastatic lung cancer. Distant metastatic disease is less likely. The right upper lobe lesion invades the hilum. There is a satellite nodule inferior to the right lower lobe lesion. Sub- carinal lymphadenopathy. No aortic dissection. Left common carotid artery origin fibrofatty plaque with moderate 50% stenosis. Cardiomegaly and severe coronary artery calcification.  CT A/P 11/11 >> Diffuse gallbladder wall thickening & extensive inflammation surrounding the pancreas. Inflammation is likely due to acute pancreatitis with either reactive changes of the gallbladder or concurrent acute cholecystitis. No aortic dissection. Infrarenal abdominal aortic repair with patent bilateral aortofemoral bypass. Extensive atherosclerotic disease with severe proximal femoral artery stenosis, severe mid SMA stenosis, severe bilateral renal artery stenosis. Layering density within the gallbladder may represent small stones or vicarious  excretion of contrast.  Small volume of ascites. [place] ABD 11/13 >> Three liver masses measuring up to 3.2 cm, favor metastatic disease given chest CT findings yesterday. Cholelithiasis. No visible choledocholithiasis or common bile duct dilatation.Prominent gallbladder wall edema. This could be reactive rather than cholecystitis given the gallbladder is not over dilated [facility] 11/16 >> multivessel disease (25-90%), occluded RCA with thrombus, left to right collaterals, LVEDP normal, no AV stensois  CULTURES: U/A 11/11 >> Negative  Blood Culture 11/11 >> negative Sputum11/11 >> normal flora BAL 11/15 >> 100 colonies mold >> ? Contaminant  BAL 11/15 >> stenotrophomonas maltophilia >>   CYTOLOGY  FOB 11/15 >> no malignant cells identified, acute inflammation   ANTIBIOTICS: Vancomycin 11/11 >> 11/12,11/13 >> 11/14 Zosyn 11/11 >> 1/15 Ceftriaxone 11/15 >> 11/18 Levaquin 11/17 >>   SIGNIFICANT EVENTS: 11/11  Admitted with syncope, chest pain, brady/hypotensive, externally paced, pressors, intubated 11/12  Shock, brady, vagal 11/15  Bronch   LINES/TUBES: ETT 11/11 >> 11/16, 11/16 >>11/18 Right IJ 11/11 >>  R Radial Aline 11/11 >>11/18 NGT 11/18>>>  DISCUSSION:  66 year old male admitted 11/11 with chest pain, SOB.  Found to be hypoxic, hypotensive, and bradycardiac. Intubated in ED. Cardiology consulted.  Chest imaging concerning for malignancy with mets.  Pathology pending.   ASSESSMENT / PLAN:  PULMONARY A: Acute Hypoxic Respiratory Failure  Failed Extubation 11/16 - stridor, poor effort  RUL Cavitary Spiculated  Nodule - CT Chest with 2.9 cm right upper lobe cavitary spiculated nodule and 3.7 cm right lower lobe mass highly suspicious for malignancy with mets H/O Tobacco Use  Pulmonary Edema  Stenotrophomonas in Sputum  P:   Pulmonary hygiene - IS, mobilize if mental status allows Duoneb Q6 + PRN  Completed solumedrol for stridor, monitor clinically Follow intermittent CXR  FOB  pathology negative, will need additional procedure for tissue sampling given CT appearance but would like patient to get through the acute phase of his illness and can work it up as outpatient, may start anti-coagulation now, will defer to cards  CARDIOVASCULAR A:  Bradycardia - ? AV nodal injury NSTEMI Septic vs Cardiogenic Shock, acidosis related likely  CAD s/p RCA stent 2013, AAA repair 2016 PAD AF - resolved, cards expects to reoccur  H/O HTN, HLD  P:  Tele monitoring  ASA Continue heparin gtt per pharmacy > may start oral anti-coagulation Coreg 6.26 mg BID, amlodipine 71m QD Cardiology following, appreciate input  Atropine PRN for bradycardia  PRN hydralazine for SBP > 170 Changed CVP to Qshift  RENAL A:   Acute on Chronic Renal Insufficiency - improving   P:   Lasix 40 mg IV daily Trend BMP / urinary output Replace electrolytes as indicated Avoid nephrotoxic agents, ensure adequate renal perfusion KVO IVF TF  GASTROINTESTINAL A:   Transaminase elevation - resolving shock liver, MI contribution, UKoreagallbladder negative. AFP wnl. Met liver, primary lung? - FOB pathology negative, will need additional procedure P:   NPO SLP evaluation for swallowing, doubt will pass, if fails then will resume TF per nutrition recommendations  HEMATOLOGIC A:   VTE Prophylaxis  Anemia P:  Trend CBC  Heparin gtt per Cardiology  May start oral anti-coagulants  INFECTIOUS A:   Fever  Stenotrophomonas in Sputum  P:   Discontinue rocephin  Levaquin for steno Follow cultures as above   ENDOCRINE A:   Hyperglycemia   HIGH cortisol response-poor outcome>? P:   CBG with SSI   NEUROLOGIC A:   Acute Metabolic Encephalopathy  - resolved H/O ETOH - ETOH 155  P:   RASS goal: 0 to -1  CIWA protocol   Avoiding precedex for now given bradycardia on admit  Continue folate/thiamine  Seroquel 50 mg BID, watch QT with levaquin on board as well Add Clonazepam to avoid so much IV  ativan  FAMILY  - Updates: No family bedside, requiring ativan on virtually an hourly bases.  Hold in the ICU given mental status and concern for airway protection.  The patient is critically ill with multiple organ systems failure and requires high complexity decision making for assessment and support, frequent evaluation and titration of therapies, application of advanced monitoring technologies and extensive interpretation of multiple databases.   Critical Care Time devoted to patient care services described in this note is  35  Minutes. This time reflects time of care of this signee Dr WJennet Maduro This critical care time does not reflect procedure time, or teaching time or supervisory time of PA/NP/Med student/Med Resident etc but could involve care discussion time.  WRush Farmer M.D. LEn[facility] RichardsonPulmonary/Critical Care  Medicine. Pager: [phone]. After hours pager: [phone].  07/26/2017, 9:18 AM

## 2017-07-26 NOTE — Progress Notes (Signed)
Patient still has increased work of breathing. ELink called and Dr. Pearline Cables in to se patient. Gave PRN Duonebs and scheduled Steroid. Will continue to monitor.

## 2017-07-26 NOTE — Progress Notes (Signed)
RT nasotracheally suctioned patient down left nostril. Small amount of thick yellow/tan secretions.Vitals are stable and sats are 97%. Rt will continue to monitor.

## 2017-07-26 NOTE — Progress Notes (Signed)
[place] for Heparin Indication: chest pain/ACS  Allergies  Allergen Reactions  . Chlorhexidine Itching and Rash  . Codeine Itching and Rash    Patient Measurements: Height: 5\' 10"  (177.8 cm) Weight: 163 lb 9.3 oz (74.2 kg) IBW/kg (Calculated) : 73  Vital Signs: Temp: 98.4 F (36.9 C) (11/19 0904) Temp Source: Core (Comment) (11/19 0800) BP: 144/74 (11/19 0904) Pulse Rate: 83 (11/19 0904)  Labs: Recent Labs    07/30/2017 2034  07/24/17 0432  07/25/17 0414 07/25/17 1200 07/26/17 0449  HGB  --    < > 10.4*  --  9.7*  --  9.5*  HCT  --   --  33.0*  --  30.0*  --  29.6*  PLT  --   --  223  --  254  --  281  HEPARINUNFRC  --   --  0.36   < > 0.71* 0.56 0.47  CREATININE 2.09*  --  1.90*  --  1.72*  --  1.69*  CKTOTAL  --   --  143  --   --   --   --   TROPONINI 4.04*  --   --   --   --   --   --    < > = values in this interval not displayed.    Estimated Creatinine Clearance: 44.4 mL/min (A) (by C-G formula based on SCr of 1.69 mg/dL (H)).  Assessment: 66 y.o. male with VDRF/cardiogenic shock, elevated cardiac markers s/p cardiac cath.  Heparin continues for afib which is likely to return. Cardiology planning oral anticoagulation once stable and procedures are completed. No issues noted overnight. CBC stable  Goal of Therapy:  Heparin level 0.3-0.7 units/ml Monitor platelets by anticoagulation protocol: Yes   Plan:  Continue heparin at 1200 units/hr.  Daily heparin level and CBC.   Erin Hearing PharmD., BCPS Clinical Pharmacist Pager [phone] 07/26/2017 10:35 AM

## 2017-07-26 NOTE — Evaluation (Signed)
Clinical/Bedside Swallow Evaluation Patient Details  Name: Adam Sizemore Sr. MRN: [id number] Date of Birth: 10/01/1950  Today's Date: 07/26/2017 Time: SLP Start Time (ACUTE ONLY): 0950 SLP Stop Time (ACUTE ONLY): 1002 SLP Time Calculation (min) (ACUTE ONLY): 12 min  Past Medical History:  Past Medical History:  Diagnosis Date  . Abdominal aortic aneurysm ([place])   . Congestive heart failure ([place])   . Coronary artery disease   . History of hiatal hernia   . HTN (hypertension) 05/28/2012  . Hypertension   . NSTEMI (non-ST elevated myocardial infarction). 05/28/12 05/28/2012  . Peripheral arterial disease ([place])   . Shortness of breath dyspnea   . Thrombocytopenia ([place]) 05/29/2012  . Tobacco abuse 05/28/2012   Past Surgical History:  Past Surgical History:  Procedure Laterality Date  . AORTOBIFEMORAL BYPASS GRAFT Bilateral 05/30/2015   Performed by Serafina Mitchell, MD at [facility]  . CARDIAC CATHETERIZATION    . CORONARY STENT PLACEMENT    . HERNIA REPAIR    . LEFT HEART CATH AND CORONARY ANGIOGRAPHY N/A 07/28/2017   Performed by Jettie Booze, MD at [facility] CV LAB  . LEFT HEART CATHETERIZATION WITH CORONARY ANGIOGRAM N/A 05/30/2012   Performed by Troy Sine, MD at [facility] CATH [facility]  . Ultrasound Guidance For Vascular Access  08/04/2017   Performed by Jettie Booze, MD at [facility] INVASIVE CV LAB   HPI:  66 yr old found unresponsive. Found to be bradycardic, cardiogenic shock sepsis versu, acidosis likely related per criticak care MD.  New lung mass found. Intubated 11/11-11/16 and reintubated 11/16-11/18. PMH: ETOH abuse, CHF, HTN, tobacco abuse, NSTEMI, hiatal hernia, CAD. CXR 11/18 continued layering bilateral effusions and bibasilar atelectasis or   Assessment / Plan / Recommendation Clinical Impression  Pt given sedatives earlier this am due to restlessness, therefore pt awake however not fully alert during bedside swallow assessment. He appears confused, refusing  oral care, moving head away from to[place]; unable to fully assess oral-motor function and unable to elicit vocalizations. Respirations audible and wet at rest. Much encouragement needed to accept ice chips x 3; weak. palpable swallow. Pt also has nasogastric suction. Continue NPO status and initiate tube feeds when appropriate per MD. Pt will need objective swallow assessment when appropriate.     SLP Visit Diagnosis: Dysphagia, unspecified (R13.10)    Aspiration Risk  Severe aspiration risk    Diet Recommendation NPO   Medication Administration: Via alternative means    Other  Recommendations Oral Care Recommendations: Oral care QID   Follow up Recommendations (TBD)      Frequency and Duration min 2x/week  2 weeks       Prognosis Prognosis for Safe Diet Advancement: Good Barriers to Reach Goals: Cognitive deficits      Swallow Study   General HPI: 66 yr old found unresponsive. Found to be bradycardic, cardiogenic shock sepsis versu, acidosis likely related per criticak care MD.  New lung mass found. Intubated 11/11-11/16 and reintubated 11/16-11/18. PMH: ETOH abuse, CHF, HTN, tobacco abuse, NSTEMI, hiatal hernia, CAD. CXR 11/18 continued layering bilateral effusions and bibasilar atelectasis or Type of Study: Bedside Swallow Evaluation Previous Swallow Assessment: none Diet Prior to this Study: NPO Temperature Spikes Noted: No Respiratory Status: Nasal cannula History of Recent Intubation: Yes Length of Intubations (days): 8 days Date extubated: 07/25/17(intubated x 2) Behavior/Cognition: Lethargic/Drowsy;Confused;Impulsive;Requires cueing;Doesn't follow directions Oral Cavity Assessment: (pt refusing, moving head away, will continue to assess) Oral Care Completed by SLP: Other (Comment)(pt refusing, moving head  away, will continue to assess) Oral Cavity - Dentition: Poor condition;Missing dentition(2 teeth visible) Vision: (?) Self-Feeding Abilities: Total assist Patient  Positioning: Upright in bed Baseline Vocal Quality: (no vocalizations) Volitional Cough: Cognitively unable to elicit Volitional Swallow: Unable to elicit    Oral/Motor/Sensory Function Overall Oral Motor/Sensory Function: Generalized oral weakness   Ice Chips Ice chips: Impaired Presentation: Spoon Oral Phase Impairments: Reduced labial seal;Reduced lingual movement/coordination;Poor awareness of bolus Oral Phase Functional Implications: Oral holding Pharyngeal Phase Impairments: (congested respirations at rest)   Thin Liquid Thin Liquid: Not tested    Nectar Thick Nectar Thick Liquid: Not tested   Honey Thick Honey Thick Liquid: Not tested   Puree Puree: Not tested   Solid   GO   Solid: Not tested        Houston Siren 07/26/2017,10:22 AM   Orbie Pyo Colvin Caroli[employer]Ed [employer] [phone]

## 2017-07-26 NOTE — Progress Notes (Addendum)
Nutrition Follow-up  DOCUMENTATION CODES:   Non-severe (moderate) malnutrition in context of chronic illness  INTERVENTION:    Monitor for diet advancement pending speech evaluation.   Vital AF 1.2 @ goal of 65 ml/hr via NGT  This provides 1872 calories, 117 g protein, and 1265 ml free water. This meets 100% of calorie needs and 100% of protein needs.   NUTRITION DIAGNOSIS:   Moderate Malnutrition related to chronic illness(lung mass highly suspicious for malignancy, hx of EtOH abuse) as evidenced by moderate fat depletion, mild muscle depletion.  Ongoing   GOAL:   Provide needs based on ASPEN/SCCM guidelines  Initiate tube feeding pending speech evaluation  MONITOR:   TF tolerance, Vent status, Labs, Weight trends  REASON FOR ASSESSMENT:   Consult, Ventilator Enteral/tube feeding initiation and management  ASSESSMENT:   66 yo male admitted after being found unresponsive with acute respiratory failure requiring intubation (CT chest with 2.9 cm RUL cavitary spiculated nodule and 3.7 cm RLL mass highly suspicious for malignancy, septic vs cardiogenic shock. Pt with hx of CAD s/p RCA stent in 2013, AAA s/p open repair in 2016, HTN, HLD, EtOH abuse   11/15- bronch to assess for malignancy 11/16- cath placement, pt extubated, OGT removed 11/17- NGT in place   Pt on room air at this time. Pt unable to provide information as he is agitated and confused (placed on CIWA protocol). Spoke with nurse who reports pt has not ate since 11/16 and would like to begin feedings pending speech results today.  Weight noted to be increased by 3 lb since last RD visit.   Medications reviewed and include: folic acid, lasix, lactulose, thiamine Labs reviewed: BUN 46 (H) Creatinine 1.69 (H)   Diet Order:  Diet NPO time specified  EDUCATION NEEDS:   Not appropriate for education at this time  Skin:  Skin Assessment: Reviewed RN Assessment(no pressure ulcers documented)  Last BM:   07/25/17  Height:   Ht Readings from Last 1 Encounters:  07/31/2017 5\' 10"  (1.778 m)    Weight:   Wt Readings from Last 1 Encounters:  07/26/17 163 lb 9.3 oz (74.2 kg)    Ideal Body Weight:  75.4 kg  BMI:  Body mass index is 23.47 kg/m.  Estimated Nutritional Needs:   Kcal:  1855-2055 kcal/day  Protein:  110-120 g/day  Fluid:  > 2 L/day    Mariana Single RD, LDN Clinical Nutrition Pager # - [phone]

## 2017-07-26 NOTE — Progress Notes (Signed)
IV Team Note;  Asked to start another piv in case current iv goes bad;  Pt with multiple dressings on each arm ; has central line with 2 un-used ports;  Attempted x 1 to start another PIV in the Right hand w RN in the room;  Pt pulled his hand/arm away; unable to restart;  Will keep current site;  RN aware;    Raynelle Fanning RN  IV Team

## 2017-07-26 NOTE — Progress Notes (Signed)
RT contacted by RN to give pt breathing treatment for severe expiratory wheezing and increased WOB. RT gave pt treatment and after assesing pt. RT contacted [facility] to obtain an order for BIPAP. Obtained order from DR somers and  RT placed pt on vent with settings 20/5, 40 and a rate of 12. Pt tolerating well, respirations have decreased and RT to cont to monitor.

## 2017-07-26 NOTE — Progress Notes (Signed)
Patient has an increased work of breathing. ABG ordered and resulted. Increased nasal cannula to 4l/min. Tolerating well. Will continue to monitor.

## 2017-07-27 ENCOUNTER — Inpatient Hospital Stay (HOSPITAL_COMMUNITY): Payer: Medicare HMO

## 2017-07-27 DIAGNOSIS — Z7901 Long term (current) use of anticoagulants: Secondary | ICD-10-CM

## 2017-07-27 DIAGNOSIS — I480 Paroxysmal atrial fibrillation: Secondary | ICD-10-CM

## 2017-07-27 DIAGNOSIS — I25708 Atherosclerosis of coronary artery bypass graft(s), unspecified, with other forms of angina pectoris: Secondary | ICD-10-CM

## 2017-07-27 LAB — BASIC METABOLIC PANEL
ANION GAP: 8 (ref 5–15)
BUN: 44 mg/dL — ABNORMAL HIGH (ref 6–20)
CALCIUM: 9.1 mg/dL (ref 8.9–10.3)
CHLORIDE: 105 mmol/L (ref 101–111)
CO2: 26 mmol/L (ref 22–32)
Creatinine, Ser: 1.48 mg/dL — ABNORMAL HIGH (ref 0.61–1.24)
GFR calc Af Amer: 55 mL/min — ABNORMAL LOW (ref >60)
GFR calc non Af Amer: 48 mL/min — ABNORMAL LOW (ref >60)
GLUCOSE: 115 mg/dL — AB (ref 65–99)
Potassium: 4 mmol/L (ref 3.5–5.1)
SODIUM: 139 mmol/L (ref 135–145)

## 2017-07-27 LAB — GLUCOSE, CAPILLARY
GLUCOSE-CAPILLARY: 101 mg/dL — AB (ref 65–99)
GLUCOSE-CAPILLARY: 108 mg/dL — AB (ref 65–99)
GLUCOSE-CAPILLARY: 101 mg/dL — AB (ref 65–99)
GLUCOSE-CAPILLARY: 107 mg/dL — AB (ref 65–99)
Glucose-Capillary: 103 mg/dL — ABNORMAL HIGH (ref 65–99)
Glucose-Capillary: 109 mg/dL — ABNORMAL HIGH (ref 65–99)

## 2017-07-27 LAB — BLOOD GAS, ARTERIAL
ACID-BASE EXCESS: 1.3 mmol/L (ref 0.0–2.0)
BICARBONATE: 24.5 mmol/L (ref 20.0–28.0)
DELIVERY SYSTEMS: POSITIVE
DRAWN BY: 41422
Expiratory PAP: 5
FIO2: 40
Inspiratory PAP: 15
O2 SAT: 97.7 %
PATIENT TEMPERATURE: 98.6
PCO2 ART: 33 mmHg (ref 32.0–48.0)
PH ART: 7.482 — AB (ref 7.350–7.450)
PO2 ART: 105 mmHg (ref 83.0–108.0)

## 2017-07-27 LAB — CBC
HCT: 29.9 % — ABNORMAL LOW (ref 39.0–52.0)
Hemoglobin: 9.5 g/dL — ABNORMAL LOW (ref 13.0–17.0)
MCH: 30.9 pg (ref 26.0–34.0)
MCHC: 31.8 g/dL (ref 30.0–36.0)
MCV: 97.4 fL (ref 78.0–100.0)
PLATELETS: 298 10*3/uL (ref 150–400)
RBC: 3.07 MIL/uL — ABNORMAL LOW (ref 4.22–5.81)
RDW: 14.3 % (ref 11.5–15.5)
WBC: 7.7 10*3/uL (ref 4.0–10.5)

## 2017-07-27 LAB — MAGNESIUM: Magnesium: 1.9 mg/dL (ref 1.7–2.4)

## 2017-07-27 LAB — PHOSPHORUS: PHOSPHORUS: 3.7 mg/dL (ref 2.5–4.6)

## 2017-07-27 LAB — HEPARIN LEVEL (UNFRACTIONATED): Heparin Unfractionated: 0.52 IU/mL (ref 0.30–0.70)

## 2017-07-27 MED ORDER — FUROSEMIDE 10 MG/ML IJ SOLN
40.0000 mg | Freq: Two times a day (BID) | INTRAMUSCULAR | Status: AC
  Administered 2017-07-27 – 2017-07-28 (×2): 40 mg via INTRAVENOUS
  Filled 2017-07-27 (×2): qty 4

## 2017-07-27 MED ORDER — TAMSULOSIN HCL 0.4 MG PO CAPS
0.4000 mg | ORAL_CAPSULE | Freq: Every day | ORAL | Status: DC
  Administered 2017-07-27 – 2017-08-02 (×7): 0.4 mg via ORAL
  Filled 2017-07-27 (×8): qty 1

## 2017-07-27 MED ORDER — CARVEDILOL 12.5 MG PO TABS
12.5000 mg | ORAL_TABLET | Freq: Two times a day (BID) | ORAL | Status: DC
Start: 2017-07-27 — End: 2017-07-29
  Administered 2017-07-27 – 2017-07-28 (×3): 12.5 mg via ORAL
  Filled 2017-07-27 (×3): qty 1

## 2017-07-27 MED ORDER — APIXABAN 5 MG PO TABS
5.0000 mg | ORAL_TABLET | Freq: Two times a day (BID) | ORAL | Status: DC
  Administered 2017-07-27 – 2017-08-02 (×13): 5 mg via ORAL
  Filled 2017-07-27 (×14): qty 1

## 2017-07-27 MED ORDER — CLOPIDOGREL BISULFATE 75 MG PO TABS
75.0000 mg | ORAL_TABLET | Freq: Every day | ORAL | Status: DC
  Administered 2017-07-27 – 2017-08-02 (×7): 75 mg via ORAL
  Filled 2017-07-27 (×8): qty 1

## 2017-07-27 NOTE — Progress Notes (Signed)
[facility] Progress Note Patient Name: Kayston Jodoin Sr. DOB: 01/04/1951 MRN: [id number]   Date of Service  07/27/2017  HPI/Events of Note  Oliguria - Bladder scan with > 1000 mL residual.   eICU Interventions  Will order: 1. I/O cath PRN.      Intervention Category Intermediate Interventions: Oliguria - evaluation and management  Sommer,Steven Eugene 07/27/2017, 1:35 AM

## 2017-07-27 NOTE — Progress Notes (Signed)
Pt has only voided 53ml since 1746. Pt not fully oriented therefore does not understand when RN encouraged him to urinate.  Bladder scan showed pt had >1066ml in bladder.  [facility] called and Oletta Darter MD ordered in and out cath. Pt put out 1133ml. Post cath bladder scan showed 66ml left.  External catheter with leg strap replaced. Will continue to monitor.

## 2017-07-27 NOTE — Progress Notes (Signed)
SLP Cancellation Note  Patient Details Name: Adam Villwock Sr. MRN: [id number] DOB: 1950-10-18   Cancelled treatment:        Pt on Bipap when checked earlier. Will continue efforts.    Houston Siren 07/27/2017, 2:20 PM   Orbie Pyo Colvin Caroli[employer] [phone]

## 2017-07-27 NOTE — Progress Notes (Signed)
Patient had no voided this shift. Bladder scan showed greater than 500. Placed in and out cath and returned 673ml. Informed Eric Form, NP with CCM and she stated to place foley. Will continue to monitor.

## 2017-07-27 NOTE — Progress Notes (Signed)
Progress Note  Patient Name: Adam Beitler Sr. Date of Encounter: 07/27/2017  Primary Cardiologist: Dr. Claiborne Billings  Subjective   Denies chest pain.  Not feeling short of breath now but had to go back onto BiPAP for increased work of breathing.   Inpatient Medications    Scheduled Meds: . amLODipine  10 mg Oral Daily  . aspirin  81 mg Oral Daily  . carvedilol  6.25 mg Oral BID WC  . clonazePAM  1 mg Per Tube BID  . dexamethasone  6 mg Intravenous A1P  . folic acid  1 mg Intravenous Daily  . ipratropium-albuterol  3 mL Nebulization Q6H  . lactulose  30 g Oral Daily  . mouth rinse  15 mL Mouth Rinse q12n4p  . pantoprazole sodium  40 mg Per Tube QHS  . QUEtiapine  50 mg Oral BID  . sodium chloride flush  10-40 mL Intracatheter Q12H  . thiamine injection  100 mg Intravenous Daily   Continuous Infusions: . sodium chloride Stopped (07/21/17 1607)  . sodium chloride    . feeding supplement (VITAL AF 1.2 CAL) Stopped (07/26/17 1417)  . heparin 1,200 Units/hr (07/27/17 0800)  . levofloxacin (LEVAQUIN) IV Stopped (07/26/17 1746)   PRN Meds: sodium chloride, [CANCELED] Place/Maintain arterial line **AND** sodium chloride, albuterol, atropine, hydrALAZINE, ipratropium-albuterol, LORazepam, sodium chloride flush   Vital Signs    Vitals:   07/27/17 0747 07/27/17 0800 07/27/17 0810 07/27/17 0812  BP:  (!) 151/87 (!) 151/87   Pulse:  83 85   Resp:  19 (!) 26   Temp: 98.8 F (37.1 C)     TempSrc: Axillary     SpO2:  100% 100% 100%  Weight:      Height:        Intake/Output Summary (Last 24 hours) at 07/27/2017 0928 Last data filed at 07/27/2017 0800 Gross per 24 hour  Intake 336 ml  Output 2101 ml  Net -1765 ml   Filed Weights   07/25/17 0429 07/26/17 0401 07/27/17 0324  Weight: 75.5 kg (166 lb 7.2 oz) 74.2 kg (163 lb 9.3 oz) 70 kg (154 lb 5.2 oz)    Telemetry    Sinus rhythm.  Frequent PVCs and PACs.  3 beats of NSVT.  Short run of PAT.  - Personally  Reviewed  ECG    n/a - Personally Reviewed  Physical Exam   VS:  BP (!) 151/87   Pulse 85   Temp 98.8 F (37.1 C) (Axillary)   Resp (!) 26   Ht 5\' 10"  (1.778 m)   Wt 70 kg (154 lb 5.2 oz)   SpO2 100%   BMI 22.14 kg/m  , BMI Body mass index is 22.14 kg/m. GENERAL: Chronically ill-appearing.  Very weak and unable to sit up.  Bipap in place.  HEENT: Pupils equal round and reactive, fundi not visualized, oral mucosa unremarkable NECK:  Unable to assess JVD.  carotid upstroke brisk and symmetric, no bruits, no thyromegaly LUNGS: Crackles at the right base.  No wheezes or rhonchi.   HEART:  RRR.  PMI not displaced or sustained,S1 and S2 within normal limits, no S3, no S4, no clicks, no rubs, no murmurs ABD:  Flat, positive bowel sounds normal in frequency in pitch, no bruits, no rebound, no guarding, no midline pulsatile mass, no hepatomegaly, no splenomegaly EXT:  2 plus pulses throughout, no edema, no cyanosis no clubbing SKIN:  No rashes no nodules NEURO:  Cranial nerves II through XII grossly intact, motor  grossly intact throughout PSYCH: Confused.    Labs    Chemistry Recent Labs  Lab 07/21/17 0500  07/24/17 0432 07/25/17 0414 07/26/17 0449 07/27/17 0350  NA 132*   < > 134* 136 139 139  K 3.8   < > 3.9 4.0 3.5 4.0  CL 101   < > 103 106 107 105  CO2 24   < > 20* 22 23 26   GLUCOSE 79   < > 132* 130* 110* 115*  BUN 19   < > 30* 42* 46* 44*  CREATININE 1.63*   < > 1.90* 1.72* 1.69* 1.48*  CALCIUM 7.3*   < > 8.2* 8.4* 8.6* 9.1  PROT 5.1*  --  5.7* 5.4*  --   --   ALBUMIN 2.3*  --  2.2* 2.2*  --   --   AST 1,616*  --  131* 63*  --   --   ALT 1,413*  --  457* 287*  --   --   ALKPHOS 163*  --  137* 110  --   --   BILITOT 1.8*  --  0.8 0.7  --   --   GFRNONAA 42*   < > 35* 40* 41* 48*  GFRAA 49*   < > 41* 46* 47* 55*  ANIONGAP 7   < > 11 8 9 8    < > = values in this interval not displayed.     Hematology Recent Labs  Lab 07/25/17 0414 07/26/17 0449  07/27/17 0350  WBC 8.9 7.7 7.7  RBC 3.08* 3.06* 3.07*  HGB 9.7* 9.5* 9.5*  HCT 30.0* 29.6* 29.9*  MCV 97.4 96.7 97.4  MCH 31.5 31.0 30.9  MCHC 32.3 32.1 31.8  RDW 14.3 14.2 14.3  PLT 254 281 298    Cardiac Enzymes Recent Labs  Lab 07/20/17 1640 08/03/2017 2034  TROPONINI 24.97* 4.04*   No results for input(s): TROPIPOC in the last 168 hours.   BNP Recent Labs  Lab 07/22/2017 2034 07/24/17 0432  BNP 4,018.6* 3,789.9*     DDimer  No results for input(s): DDIMER in the last 168 hours.   Radiology    Dg Chest Port 1 View  Result Date: 07/26/2017 CLINICAL DATA:  Respiratory failure.  Status postextubation EXAM: PORTABLE CHEST 1 VIEW COMPARISON:  July 25, 2017 FINDINGS: Endotracheal tube no longer apparent. Nasogastric tube tip not seen. Nasogastric tube side port appears at the gastroesophageal junction. No pneumothorax. There are pleural effusions bilaterally, larger on the right than on the left, with bibasilar atelectasis. Heart is mildly enlarged with pulmonary venous hypertension. No adenopathy. There is aortic atherosclerosis. No evident bone lesions. IMPRESSION: Tube and catheter positions as described without pneumothorax. Note that the nasogastric tube side port is at the gastroesophageal junction. Advise advancing nasogastric tube 6-8 cm to confirm both nasogastric tube and side port well below the diaphragm. Bilateral pleural effusions with bibasilar atelectasis persist. Effusion larger on the right than on the left, stable. Stable pulmonary vascular congestion. There is aortic atherosclerosis. Aortic Atherosclerosis (ICD10-I70.0). Electronically Signed   By: Lowella Grip III M.D.   On: 07/26/2017 07:23    Cardiac Studies   Echo 07/19/2017   - Left ventricle: The cavity size was normal. There was moderate concentric hypertrophy. Systolic function was normal. The estimated ejection fraction was in the range of 60% to 65%. Wall motion was normal; there  were no regional wall motion abnormalities. - Mitral valve: Calcified annulus. - Right ventricle: Systolic function was moderately  reduced. - Tricuspid valve: There was mild regurgitation. - Pericardium, extracardiac: The pericardium appears thickened at the apex as well as the apical RV wall with small pericardial effusion over the apex. Suggest cardiac MRI to evaluate pericardium further.    Cath 07/17/2017  Ost 1st Mrg lesion is 50% stenosed.  Ost Cx to Mid Cx lesion is 25% stenosed.  Prox LAD to Mid LAD lesion is 25% stenosed.  Ost RCA to Prox RCA lesion is 90% stenosed.  Mid RCA lesion is 99% stenosed.  Ost RPDA to RPDA lesion is 90% stenosed.  Previously placed Dist RCA stent (unknown type) is widely patent.  LV end diastolic pressure is normal.  There is no aortic valve stenosis.  Subtotally occluded RCA with thrombus, left to right collaterals. Continue medical therapy.   Coronary Diagrams   Diagnostic Diagram           Patient Profile     66 y.o. male with CAD s/p RCA PCI in 2013, PAD (AAA, aortobifem bypass in 2016), hypertension and bradycardia admitted 11/11 with syncope.  Underwent LHC and found to have subtotal occlusion with heavy thrombus burden in the right coronary artery (with formed left to right collaterals), deemed best for medical therapy.He also had hypoxic respiratory failure, extubated 11/18.  Assessment & Plan    # NSTEMI:  Troponin peaked at 29.  Left heart catheterization revealed 50% OM1, 25% left circumflex, 25% proximal LAD, 90% ostial to proximal RCA, 99% mid RCA, 90% R PDA.  There were also left to right collaterals.  Medical management was recommended.  There are no plans for inpatient lung biopsy.  This will occur after he stabilizes from his acute illness.  We will stop aspirin and start clopidogrel 75 mg daily.  He has a history of alcohol abuse and LFTs were elevated this admission.  We will try a statin once  stable.  His heart rate remains stable and his blood pressure is elevated so we will increase carvedilol to 12.5 mg twice daily.  # Syncope: No recurrent events since admission.  PACs and PVCs noted on telemetry with short runs of NSVT.  Increase carvedilol as above.  # PAF: Resolved but likely to recur.  As above there is no plans for lung biopsy this admission.  Therefore we will start Eliquis   # Hypertension:  Blood pressure has been consistently above goal.  Amlodipine was increased to 10 mg.  His blood pressure remains above goal so we will increase carvedilol as above.  # R UL/LL mass: Suspicious for lung cancer.   He will have an outpatient biopsy once stable.  # Acute on chronic renal failure: His creatinine is now back to baseline.  Time spent: 30 minutes-Greater than 50% of this time was spent in counseling, explanation of diagnosis, planning of further management, and coordination of care.    For questions or updates, please contact [facility] Please consult [email or web address] for contact info under Cardiology/STEMI.      Signed, Skeet Latch, MD  07/27/2017, 9:28 AM

## 2017-07-27 NOTE — Progress Notes (Signed)
PULMONARY / CRITICAL CARE MEDICINE   Name: Adam Nardozzi Sr. MRN: [id number] DOB: 01-Jun-1951    ADMISSION DATE:  07/08/2017 CONSULTATION DATE:  07/19/2017  REFERRING MD:  Dr. Tomi Bamberger  CHIEF COMPLAINT:  Chest Pain   HISTORY OF PRESENT ILLNESS:   66 year old male with PMH of CAD s/p RCA stent 2013, AAA s/p open repair in 2016 with aortiobifemoral bypass graft, HTN, HLD, ETOH. Former smoker, quit 2013 with a 45 pack year history  Presents to ED on 11/11 after wife found patient face down for unknown time. When EMS arrived patient complaining of central chest pain/syncope. Family states he took an unknown amount of nitro tablets. EMS gave 9 mcg EPI and 1 mg Atropine for Bradycardia and Hypotension. Upon arrival to ED patient was diaphoretic and required non-rebreather. Intubated in ED. ABG 7.353/25.5/282. ETOH 155. BP Systolic 32-44, on external pacer, underlying HR 15. Levophed gtt.  Cardiology consulted. PCCM asked to admit.    When looking at [facility] bottle, patient only missing 4 tablets (family states they are sure he took one on Halloween).  Family reports for the last few months patient has been complaining of progressive dyspnea, over the last couple of weeks intermittent chest pain. Per family patient drinks heavily.     SUBJECTIVE:  Remains agitated overnight, on BiPAP  VITAL SIGNS: BP (!) 159/101   Pulse 93   Temp 98.8 F (37.1 C) (Axillary)   Resp 17   Ht _0  (1.778 m)   Wt 154 lb 5.2 oz (70 kg)   SpO2 100%   BMI 22.14 kg/m   HEMODYNAMICS: CVP:  [11 mmHg-14 mmHg] 11 mmHg  VENTILATOR SETTINGS: Vent Mode: BIPAP FiO2 (%):  [28 %-40 %] 40 % Set Rate:  [12 bmp] 12 bmp PEEP:  [5 cmH20] 5 cmH20 Plateau Pressure:  [10 cmH20] 10 cmH20  INTAKE / OUTPUT: I/O last 3 completed shifts: In: 569 [I.V.:509; NG/GT:60] Out: 2901 [Urine:2751; Emesis/NG output:150]  PHYSICAL EXAMINATION: General: Acute on chronically ill appearing male, NAD, agitated on BIPAP HEENT: [place]/AT,  PERRL, EOM-I and MMM Neuro: Confused, moving all ext to pain not command CV: RRR, Nl S1/S2,  No M/R/G. PULM: Upper airway sounds transmitted, diminished per bases GI: Soft, NT, ND and +BS Extremities: Warm/dry, no edema,   Skin: No rashes or lesions, bruising noted  LABS:  BMET Recent Labs  Lab 07/25/17 0414 07/26/17 0449 07/27/17 0350  NA 136 139 139  K 4.0 3.5 4.0  CL 106 107 105  CO2 _1 BUN 42* 46* 44*  CREATININE 1.72* 1.69* 1.48*  GLUCOSE 130* 110* 115*   Electrolytes Recent Labs  Lab 07/22/2017 0053  07/24/17 0432 07/25/17 0414 07/26/17 0449 07/27/17 0350  CALCIUM 7.8*   < > 8.2* 8.4* 8.6* 9.1  MG 1.6*  --  1.9  --   --  1.9  PHOS 2.7  --  4.8*  --   --  3.7   < > = values in this interval not displayed.   CBC Recent Labs  Lab 07/25/17 0414 07/26/17 0449 07/27/17 0350  WBC 8.9 7.7 7.7  HGB 9.7* 9.5* 9.5*  HCT 30.0* 29.6* 29.9*  PLT 254 281 298   Coag's Recent Labs  Lab 07/21/17 1045  APTT 88*  INR 1.17   Sepsis Markers Recent Labs  Lab 07/20/17 1102 07/24/17 0432  LATICACIDVEN 1.1 1.3   ABG Recent Labs  Lab 07/24/17 0440 07/26/17 1445 07/27/17 0058  PHART 7.415 7.497* 7.482*  PCO2ART 32.4 31.7* 33.0  PO2ART 97.2 67.0* 105   Liver Enzymes Recent Labs  Lab 07/21/17 0500 07/24/17 0432 07/25/17 0414  AST 1,616* 131* 63*  ALT 1,413* 457* 287*  ALKPHOS 163* 137* 110  BILITOT 1.8* 0.8 0.7  ALBUMIN 2.3* 2.2* 2.2*   Cardiac Enzymes Recent Labs  Lab 07/20/17 1640 07/14/2017 2034  TROPONINI 24.97* 4.04*   Glucose Recent Labs  Lab 07/26/17 1153 07/26/17 1515 07/26/17 2010 07/26/17 2339 07/27/17 0333 07/27/17 0744  GLUCAP 111* 94 107* 97 101* 109*   Imaging No results found. STUDIES:  CTA Chest 11/11 >> 2.9 cm right upper lobe cavitary spiculated nodule and 3.7 cm right lower lobe mass. Findings probably represent synchronous or primary/metastatic lung cancer. Distant metastatic disease is less likely. The right  upper lobe lesion invades the hilum. There is a satellite nodule inferior to the right lower lobe lesion. Sub- carinal lymphadenopathy. No aortic dissection. Left common carotid artery origin fibrofatty plaque with moderate 50% stenosis. Cardiomegaly and severe coronary artery calcification.  CT A/P 11/11 >> Diffuse gallbladder wall thickening & extensive inflammation surrounding the pancreas. Inflammation is likely due to acute pancreatitis with either reactive changes of the gallbladder or concurrent acute cholecystitis. No aortic dissection. Infrarenal abdominal aortic repair with patent bilateral aortofemoral bypass. Extensive atherosclerotic disease with severe proximal femoral artery stenosis, severe mid SMA stenosis, severe bilateral renal artery stenosis. Layering density within the gallbladder may represent small stones or vicarious excretion of contrast.  Small volume of ascites. [place] ABD 11/13 >> Three liver masses measuring up to 3.2 cm, favor metastatic disease given chest CT findings yesterday. Cholelithiasis. No visible choledocholithiasis or common bile duct dilatation.Prominent gallbladder wall edema. This could be reactive rather than cholecystitis given the gallbladder is not over dilated [facility] 11/16 >> multivessel disease (25-90%), occluded RCA with thrombus, left to right collaterals, LVEDP normal, no AV stensois  CULTURES: U/A 11/11 >> Negative  Blood Culture 11/11 >> negative Sputum11/11 >> normal flora BAL 11/15 >> 100 colonies mold >> ? Contaminant  BAL 11/15 >> stenotrophomonas maltophilia >>   CYTOLOGY  FOB 11/15 >> no malignant cells identified, acute inflammation   ANTIBIOTICS: Vancomycin 11/11 >> 11/12,11/13 >> 11/14 Zosyn 11/11 >> 1/15 Ceftriaxone 11/15 >> 11/18 Levaquin 11/17 >>   SIGNIFICANT EVENTS: 11/11  Admitted with syncope, chest pain, brady/hypotensive, externally paced, pressors, intubated 11/12  Shock, brady, vagal 11/15  Bronch   LINES/TUBES: ETT  11/11 >> 11/16, 11/16 >>11/18 Right IJ 11/11 >>  R Radial Aline 11/11 >>11/18 NGT 11/18>>>  DISCUSSION: 66 year old male admitted 11/11 with chest pain, SOB.  Found to be hypoxic, hypotensive, and bradycardiac. Intubated in ED. Cardiology consulted.  Chest imaging concerning for malignancy with mets.  Pathology pending.   ASSESSMENT / PLAN:  PULMONARY A: Acute Hypoxic Respiratory Failure  Failed Extubation 11/16 - stridor, poor effort  RUL Cavitary Spiculated  Nodule - CT Chest with 2.9 cm right upper lobe cavitary spiculated nodule and 3.7 cm right lower lobe mass highly suspicious for malignancy with mets H/O Tobacco Use  Pulmonary Edema  Stenotrophomonas in Sputum  P:   Pulmonary hygiene - IS, mobilize if mental status allows Duoneb Q6 + PRN  Completed solumedrol for stridor, monitor clinically CXR 11/21, and stat now Add lasix x 2 doses 11/20 FOB pathology negative, will need additional procedure for tissue sampling given CT appearance but would like patient to get through the acute phase of his illness and can work it up as outpatient, may  start anti-coagulation now, will defer to cards  CARDIOVASCULAR A:  Bradycardia - ? AV nodal injury NSTEMI Septic vs Cardiogenic Shock, acidosis related likely  CAD s/p RCA stent 2013, AAA repair 2016 PAD AF - resolved, cards expects to reoccur  H/O HTN, HLD  P:  Tele monitoring  ASA Eliquis and Plavix restarted, heparin off Coreg 6.26 mg BID, amlodipine 37m QD Cardiology following, appreciate input  Atropine PRN for bradycardia  PRN hydralazine for SBP > 170 Changed CVP to Q shift EKG 11/21 Continuous QTc monitoring ( Levaquin and Seroquel)  RENAL A:   Acute on Chronic Renal Insufficiency - improving  Urinary retention   P:   Add Lasix 40 mg IV x 2 doses Trend BMP / urinary output Place Foley  Add Flomax 0.4 Replace electrolytes as indicated Avoid nephrotoxic agents, ensure adequate renal perfusion KVO  IVF  GASTROINTESTINAL A:   Transaminase elevation - resolving shock liver, MI contribution, UKoreagallbladder negative. AFP wnl. Met liver, primary lung? - FOB pathology negative, will need additional procedure Nutrition >> family concerns P:   NPO SLP evaluation for swallowing, doubt will pass, if fails then will resume TF per nutrition recommendations If remains on BIPAP will need post pyloric feeding tube for feeds ( Chortrac)  HEMATOLOGIC A:   VTE Prophylaxis  Anemia P:  Trend CBC  Eliquis and plavix per cards Monitor for any obvious source of bleeding   INFECTIOUS A:   Afebrile  Stenotrophomonas in Sputum  P: Trend fever curve and WBC   Discontinue rocephin  Continue Levaquin Levaquin for steno Re culture as needed Follow cultures as above   ENDOCRINE A:   Hyperglycemia   HIGH cortisol response-poor outcome>? P:   CBG with SSI   NEUROLOGIC A:   Acute Metabolic Encephalopathy   H/O ETOH - ETOH 155  P:   RASS goal: 0 to -1  CIWA protocol   Avoiding precedex for now given bradycardia on admit  Continue folate/thiamine  Seroquel 50 mg BID, watch QT with levaquin on board as well Add Clonazepam to avoid so much IV ativan  FAMILY  - Updates: No family bedside 11/20.   Hold in the ICU given mental status and concern for airway protection, possible need for reintubation..Magdalen Spatz AGACNP-BC [facility] Pager [phone]  07/27/2017, 10:52 AM  Attending Note:  66year old alcoholic male s/p bradycardic arrest who was very agitated and clearly in withdrawal.  Responds nicely to ativan.  Pulmonary edema overnight that required BiPAP.  Patient is coughing on exam with diffuse crackles.  I reviewed CXR myself, pulmonary edema noted.  Per RN, wife and children stated patient would not want trach/peg and subsequently no further intubation.  Will given 2 doses of lasix today.  RN to arrange for a family meeting in AM to  decide plan of care.  Will hold of intubation for now.  The patient is critically ill with multiple organ systems failure and requires high complexity decision making for assessment and support, frequent evaluation and titration of therapies, application of advanced monitoring technologies and extensive interpretation of multiple databases.   Critical Care Time devoted to patient care services described in this note is  35  Minutes. This time reflects time of care of this signee Dr WJennet Maduro This critical care time does not reflect procedure time, or teaching time or supervisory time of PA/NP/Med student/Med Resident etc but could involve care discussion time.  WRush Farmer  M.D. [facility] Pulmonary/Critical Care Medicine. Pager: [phone]. After hours pager: [phone].

## 2017-07-27 NOTE — Progress Notes (Signed)
[place] for Heparin>>apixaban Indication: chest pain/ACS  Allergies  Allergen Reactions  . Chlorhexidine Itching and Rash  . Codeine Itching and Rash    Patient Measurements: Height: 5\' 10"  (177.8 cm) Weight: 154 lb 5.2 oz (70 kg) IBW/kg (Calculated) : 73  Vital Signs: Temp: 98.8 F (37.1 C) (11/20 0747) Temp Source: Axillary (11/20 0747) BP: 154/86 (11/20 0900) Pulse Rate: 93 (11/20 0900)  Labs: Recent Labs    07/25/17 0414 07/25/17 1200 07/26/17 0449 07/27/17 0350  HGB 9.7*  --  9.5* 9.5*  HCT 30.0*  --  29.6* 29.9*  PLT 254  --  281 298  HEPARINUNFRC 0.71* 0.56 0.47 0.52  CREATININE 1.72*  --  1.69* 1.48*    Estimated Creatinine Clearance: 48.6 mL/min (A) (by C-G formula based on SCr of 1.48 mg/dL (H)).  Assessment: 66 y.o. male with VDRF/cardiogenic shock, elevated cardiac markers s/p cardiac cath.  Patient has been on heparin for his afib, will transition to apixaban today. He failed his swallow but feeding tube present.  Goal of Therapy:  Heparin level 0.3-0.7 units/ml Monitor platelets by anticoagulation protocol: Yes   Plan:  Change to apixaban 5mg  bid this morning  Erin Hearing PharmD., BCPS Clinical Pharmacist Pager [phone] 07/27/2017 9:58 AM

## 2017-07-28 ENCOUNTER — Inpatient Hospital Stay (HOSPITAL_COMMUNITY): Payer: Medicare HMO

## 2017-07-28 DIAGNOSIS — Z515 Encounter for palliative care: Secondary | ICD-10-CM

## 2017-07-28 DIAGNOSIS — Z7189 Other specified counseling: Secondary | ICD-10-CM

## 2017-07-28 LAB — GLUCOSE, CAPILLARY
GLUCOSE-CAPILLARY: 120 mg/dL — AB (ref 65–99)
GLUCOSE-CAPILLARY: 113 mg/dL — AB (ref 65–99)
GLUCOSE-CAPILLARY: 104 mg/dL — AB (ref 65–99)
GLUCOSE-CAPILLARY: 98 mg/dL (ref 65–99)
Glucose-Capillary: 124 mg/dL — ABNORMAL HIGH (ref 65–99)
Glucose-Capillary: 120 mg/dL — ABNORMAL HIGH (ref 65–99)

## 2017-07-28 LAB — CBC
HCT: 31.5 % — ABNORMAL LOW (ref 39.0–52.0)
HEMOGLOBIN: 10 g/dL — AB (ref 13.0–17.0)
MCH: 31 pg (ref 26.0–34.0)
MCHC: 31.7 g/dL (ref 30.0–36.0)
MCV: 97.5 fL (ref 78.0–100.0)
Platelets: 316 10*3/uL (ref 150–400)
RBC: 3.23 MIL/uL — ABNORMAL LOW (ref 4.22–5.81)
RDW: 14.3 % (ref 11.5–15.5)
WBC: 9.1 10*3/uL (ref 4.0–10.5)

## 2017-07-28 LAB — POCT I-STAT 3, ART BLOOD GAS (G3+)
Acid-Base Excess: 7 mmol/L — ABNORMAL HIGH (ref 0.0–2.0)
BICARBONATE: 28.8 mmol/L — AB (ref 20.0–28.0)
O2 SAT: 96 %
PCO2 ART: 30.9 mmHg — AB (ref 32.0–48.0)
PO2 ART: 68 mmHg — AB (ref 83.0–108.0)
Patient temperature: 98
TCO2: 30 mmol/L (ref 22–32)
pH, Arterial: 7.576 — ABNORMAL HIGH (ref 7.350–7.450)

## 2017-07-28 LAB — BASIC METABOLIC PANEL
Anion gap: 9 (ref 5–15)
BUN: 42 mg/dL — AB (ref 6–20)
CALCIUM: 9.3 mg/dL (ref 8.9–10.3)
CHLORIDE: 103 mmol/L (ref 101–111)
CO2: 29 mmol/L (ref 22–32)
CREATININE: 1.39 mg/dL — AB (ref 0.61–1.24)
GFR, EST AFRICAN AMERICAN: 59 mL/min — AB (ref >60)
GFR, EST NON AFRICAN AMERICAN: 51 mL/min — AB (ref >60)
Glucose, Bld: 125 mg/dL — ABNORMAL HIGH (ref 65–99)
Potassium: 3.5 mmol/L (ref 3.5–5.1)
SODIUM: 141 mmol/L (ref 135–145)

## 2017-07-28 MED ORDER — FOLIC ACID 1 MG PO TABS
1.0000 mg | ORAL_TABLET | Freq: Every day | ORAL | Status: DC
  Administered 2017-07-29 – 2017-08-02 (×5): 1 mg via ORAL
  Filled 2017-07-28 (×6): qty 1

## 2017-07-28 MED ORDER — AMIODARONE HCL IN DEXTROSE 360-4.14 MG/200ML-% IV SOLN
30.0000 mg/h | INTRAVENOUS | Status: DC
  Administered 2017-07-28 – 2017-07-29 (×3): 30 mg/h via INTRAVENOUS
  Filled 2017-07-28 (×2): qty 200

## 2017-07-28 MED ORDER — VITAMIN B-1 100 MG PO TABS
100.0000 mg | ORAL_TABLET | Freq: Every day | ORAL | Status: DC
  Administered 2017-07-29: 100 mg via ORAL
  Filled 2017-07-28: qty 1

## 2017-07-28 MED ORDER — AMIODARONE HCL IN DEXTROSE 360-4.14 MG/200ML-% IV SOLN
60.0000 mg/h | INTRAVENOUS | Status: AC
  Administered 2017-07-28: 60 mg/h via INTRAVENOUS
  Filled 2017-07-28: qty 200

## 2017-07-28 MED ORDER — FUROSEMIDE 10 MG/ML IJ SOLN
40.0000 mg | Freq: Two times a day (BID) | INTRAMUSCULAR | Status: AC
  Administered 2017-07-28 (×2): 40 mg via INTRAVENOUS
  Filled 2017-07-28 (×2): qty 4

## 2017-07-28 MED ORDER — AMIODARONE LOAD VIA INFUSION
150.0000 mg | Freq: Once | INTRAVENOUS | Status: AC
  Administered 2017-07-28: 150 mg via INTRAVENOUS
  Filled 2017-07-28: qty 83.34

## 2017-07-28 MED ORDER — AMIODARONE HCL IN DEXTROSE 360-4.14 MG/200ML-% IV SOLN
INTRAVENOUS | Status: AC
  Administered 2017-07-28: 150 mg via INTRAVENOUS
  Filled 2017-07-28: qty 200

## 2017-07-28 MED ORDER — POTASSIUM CHLORIDE 10 MEQ/100ML IV SOLN
10.0000 meq | INTRAVENOUS | Status: AC
  Administered 2017-07-28 (×3): 10 meq via INTRAVENOUS
  Filled 2017-07-28 (×3): qty 100

## 2017-07-28 NOTE — Progress Notes (Signed)
Progress Note  Patient Name: Adam Autry Sr. Date of Encounter: 07/28/2017  Primary Cardiologist: Dr. Claiborne Billings  Subjective   Unable to obtain.  Patient somnolent.  Inpatient Medications    Scheduled Meds: . amLODipine  10 mg Oral Daily  . apixaban  5 mg Oral BID  . carvedilol  12.5 mg Oral BID WC  . clonazePAM  1 mg Per Tube BID  . clopidogrel  75 mg Oral Daily  . dexamethasone  6 mg Intravenous Q9U  . folic acid  1 mg Intravenous Daily  . ipratropium-albuterol  3 mL Nebulization Q6H  . lactulose  30 g Oral Daily  . mouth rinse  15 mL Mouth Rinse q12n4p  . pantoprazole sodium  40 mg Per Tube QHS  . QUEtiapine  50 mg Oral BID  . sodium chloride flush  10-40 mL Intracatheter Q12H  . tamsulosin  0.4 mg Oral Daily  . thiamine injection  100 mg Intravenous Daily   Continuous Infusions: . sodium chloride Stopped (07/21/17 1607)  . sodium chloride    . amiodarone 60 mg/hr (07/28/17 0432)   Followed by  . amiodarone    . feeding supplement (VITAL AF 1.2 CAL) Stopped (07/26/17 1417)  . levofloxacin (LEVAQUIN) IV Stopped (07/26/17 1746)   PRN Meds: sodium chloride, [CANCELED] Place/Maintain arterial line **AND** sodium chloride, albuterol, atropine, hydrALAZINE, ipratropium-albuterol, LORazepam, sodium chloride flush   Vital Signs    Vitals:   07/28/17 0430 07/28/17 0500 07/28/17 0600 07/28/17 0725  BP: 105/64 112/70 124/87   Pulse: 92 (!) 105 88   Resp: (!) 22 18 19    Temp:    98.6 F (37 C)  TempSrc:    Axillary  SpO2: 99%  100%   Weight:  66 kg (145 lb 8.1 oz)    Height:        Intake/Output Summary (Last 24 hours) at 07/28/2017 0803 Last data filed at 07/28/2017 0651 Gross per 24 hour  Intake 220.87 ml  Output 4125 ml  Net -3904.13 ml   Filed Weights   07/26/17 0401 07/27/17 0324 07/28/17 0500  Weight: 74.2 kg (163 lb 9.3 oz) 70 kg (154 lb 5.2 oz) 66 kg (145 lb 8.1 oz)    Telemetry    Atrial fibrillation.  Rate   - Personally  Reviewed  ECG    n/a - Personally Reviewed  Physical Exam   VS:  BP 124/87   Pulse 88   Temp 98.6 F (37 C) (Axillary)   Resp 19   Ht 5\' 10"  (1.778 m)   Wt 66 kg (145 lb 8.1 oz)   SpO2 100%   BMI 20.88 kg/m  , BMI Body mass index is 20.88 kg/m.  CVP 11 GENERAL: Chronically ill-appearing.  Somnolent and unable to open eyes.  Bipap in place.  HEENT: Pupils equal round and reactive, fundi not visualized, oral mucosa unremarkable NECK:  Unable to assess JVD.  Carotid upstroke brisk and symmetric, no bruits, no thyromegaly LUNGS: Coarse breath sounds on anterior exam.  HEART:  RRR.  PMI not displaced or sustained,S1 and S2 within normal limits, no S3, no S4, no clicks, no rubs, no murmurs ABD:  Flat, positive bowel sounds normal in frequency in pitch, no bruits, no rebound, no guarding, no midline pulsatile mass, no hepatomegaly, no splenomegaly EXT:  2 plus pulses throughout, no edema, no cyanosis no clubbing SKIN:  No rashes no nodules NEURO:  Unable to assess PSYCH: Unable to assess   Labs  Chemistry Recent Labs  Lab 07/24/17 0432 07/25/17 0414 07/26/17 0449 07/27/17 0350 07/28/17 0334  NA 134* 136 139 139 141  K 3.9 4.0 3.5 4.0 3.5  CL 103 106 107 105 103  CO2 20* 22 23 26 29   GLUCOSE 132* 130* 110* 115* 125*  BUN 30* 42* 46* 44* 42*  CREATININE 1.90* 1.72* 1.69* 1.48* 1.39*  CALCIUM 8.2* 8.4* 8.6* 9.1 9.3  PROT 5.7* 5.4*  --   --   --   ALBUMIN 2.2* 2.2*  --   --   --   AST 131* 63*  --   --   --   ALT 457* 287*  --   --   --   ALKPHOS 137* 110  --   --   --   BILITOT 0.8 0.7  --   --   --   GFRNONAA 35* 40* 41* 48* 51*  GFRAA 41* 46* 47* 55* 59*  ANIONGAP 11 8 9 8 9      Hematology Recent Labs  Lab 07/26/17 0449 07/27/17 0350 07/28/17 0334  WBC 7.7 7.7 9.1  RBC 3.06* 3.07* 3.23*  HGB 9.5* 9.5* 10.0*  HCT 29.6* 29.9* 31.5*  MCV 96.7 97.4 97.5  MCH 31.0 30.9 31.0  MCHC 32.1 31.8 31.7  RDW 14.2 14.3 14.3  PLT 281 298 316    Cardiac  Enzymes Recent Labs  Lab 07/08/2017 2034  TROPONINI 4.04*   No results for input(s): TROPIPOC in the last 168 hours.   BNP Recent Labs  Lab 07/27/2017 2034 07/24/17 0432  BNP 4,018.6* 3,789.9*     DDimer  No results for input(s): DDIMER in the last 168 hours.   Radiology    Dg Chest Port 1 View  Result Date: 07/27/2017 CLINICAL DATA:  Acute respiratory failure. EXAM: PORTABLE CHEST 1 VIEW COMPARISON:  07/26/2017 FINDINGS: The right IJ central venous catheter and NG tubes are stable. The cardiac silhouette, mediastinal and hilar contours are unchanged. Mild tortuosity and calcification of the thoracic aorta. Persistent right-sided pleural effusion with overlying atelectasis. Left lung is grossly clear. IMPRESSION: Persistent right-sided pleural effusion and overlying atelectasis. Stable support apparatus. Electronically Signed   By: Marijo Sanes M.D.   On: 07/27/2017 12:47    Cardiac Studies   Echo 07/19/2017   - Left ventricle: The cavity size was normal. There was moderate concentric hypertrophy. Systolic function was normal. The estimated ejection fraction was in the range of 60% to 65%. Wall motion was normal; there were no regional wall motion abnormalities. - Mitral valve: Calcified annulus. - Right ventricle: Systolic function was moderately reduced. - Tricuspid valve: There was mild regurgitation. - Pericardium, extracardiac: The pericardium appears thickened at the apex as well as the apical RV wall with small pericardial effusion over the apex. Suggest cardiac MRI to evaluate pericardium further.   Cath 07/21/2017  Ost 1st Mrg lesion is 50% stenosed.  Ost Cx to Mid Cx lesion is 25% stenosed.  Prox LAD to Mid LAD lesion is 25% stenosed.  Ost RCA to Prox RCA lesion is 90% stenosed.  Mid RCA lesion is 99% stenosed.  Ost RPDA to RPDA lesion is 90% stenosed.  Previously placed Dist RCA stent (unknown type) is widely patent.  LV end  diastolic pressure is normal.  There is no aortic valve stenosis.  Subtotally occluded RCA with thrombus, left to right collaterals. Continue medical therapy.   Coronary Diagrams   Diagnostic Diagram  Patient Profile     66 y.o. male with CAD s/p RCA PCI in 2013, PAD (AAA, aortobifem bypass in 2016), hypertension and bradycardia admitted 11/11 with syncope.  Underwent LHC and found to have subtotal occlusion with heavy thrombus burden in the right coronary artery (with formed left to right collaterals), deemed best for medical therapy.He also had hypoxic respiratory failure, extubated 11/18.  Assessment & Plan    # Somnolence: Patient now unable to wake up or open his eyes.  He received lorazepam at 10pm last night.  Will check ABG.   # NSTEMI:  Troponin peaked at 29.  Left heart catheterization revealed 50% OM1, 25% left circumflex, 25% proximal LAD, 90% ostial to proximal RCA, 99% mid RCA, 90% R PDA.  There were also left to right collaterals.  Medical management was recommended.  There are no plans for inpatient lung biopsy.  This will occur after he stabilizes from his acute illness.  Continue clopidogrel 75 mg daily.  He has a history of alcohol abuse and LFTs were elevated this admission.  We will try a statin once stable.  His heart rate remains stable and his blood pressure is elevated so we will increase carvedilol to 12.5 mg twice daily.  # Syncope: No recurrent events since admission.  PACs and PVCs noted on telemetry with short runs of NSVT.  Increase carvedilol as above.  # Presumed diastolic heart failure:  CVP 11. Continue lasix 40mg  IV bid.  He was net -3.8L yesterday.  Echo noted thickened pericardium.  He will need a cardiac MRI when more stable.    # PAF: Recurrent atrial fibrillation overnight and was started on amiodarone.  Rates now controlled.  As above there is no plans for lung biopsy this admission.  Eliquis started this admission.     #  Hypertension:  Blood pressure much better today after increasing carvedilol.  # R UL/LL mass: Suspicious for lung cancer.   He will have an outpatient biopsy once stable.  # Acute on chronic renal failure: His creatinine is now back to baseline and continues to improve daily.    Time spent: 30 minutes-Greater than 50% of this time was spent in counseling, explanation of diagnosis, planning of further management, and coordination of care.    For questions or updates, please contact [place] Please consult [email or web address] for contact info under Cardiology/STEMI.      Signed, Skeet Latch, MD  07/28/2017, 8:03 AM

## 2017-07-28 NOTE — Progress Notes (Addendum)
PULMONARY / CRITICAL CARE MEDICINE   Name: Adam Lopez. MRN: [id number] DOB: 25-Jul-1951    ADMISSION DATE:  07/26/2017 CONSULTATION DATE:  07/20/2017  REFERRING MD:  Dr. Tomi Bamberger  CHIEF COMPLAINT:  Chest Pain   HISTORY OF PRESENT ILLNESS:   66 year old male with PMH of CAD s/p RCA stent 2013, AAA s/p open repair in 2016 with aortiobifemoral bypass graft, HTN, HLD, ETOH. Former smoker, quit 2013 with a 45 pack year history  Presents to ED on 11/11 after wife found patient face down for unknown time. When EMS arrived patient complaining of central chest pain/syncope. Family states he took an unknown amount of nitro tablets. EMS gave 9 mcg EPI and 1 mg Atropine for Bradycardia and Hypotension. Upon arrival to ED patient was diaphoretic and required non-rebreather. Intubated in ED. ABG 7.353/25.5/282. ETOH 155. BP Systolic 92-33, on external pacer, underlying HR 15. Levophed gtt.  Cardiology consulted. PCCM asked to admit.    When looking at [facility] bottle, patient only missing 4 tablets (family states they are sure he took one on Halloween).  Family reports for the last few months patient has been complaining of progressive dyspnea, over the last couple of weeks intermittent chest pain. Per family patient drinks heavily.     SUBJECTIVE: Remains on noninvasive mechanical ventilatory support is either agitated or sedated  VITAL SIGNS: BP 140/85   Pulse 76   Temp 98.6 F (37 C) (Axillary)   Resp 20   Ht _0  (1.778 m)   Wt 145 lb 8.1 oz (66 kg)   SpO2 99%   BMI 20.88 kg/m   HEMODYNAMICS: CVP:  [9 mmHg-11 mmHg] 10 mmHg  VENTILATOR SETTINGS: Vent Mode: BIPAP FiO2 (%):  [30 %-40 %] 30 % Set Rate:  [12 bmp] 12 bmp PEEP:  [5 cmH20] 5 cmH20 Plateau Pressure:  [4 cmH20] 4 cmH20  INTAKE / OUTPUT: I/O last 3 completed shifts: In: 388.9 [I.V.:388.9] Out: 5326 [Urine:5326]  PHYSICAL EXAMINATION: General: Ill-appearing white male is either agitated or sedated HEENT:  Noninvasive mechanical ventilatory support mask is in place he is not being fed PSY: Agitated Neuro: Appears to have some left-sided neglect is all extremities some commands for family are sedated CV: Currently in sinus rhythm heart rate 92 PULM: Decreased breath sounds in the bases currently on noninvasive mechanical ventilatory support AQ:TMAU, non-tender, bsx4 active  Extremities: warm/dry, right forearm ecchymosis is noted lower extremity edema is noted Skin: no rashes or lesions  LABS:  BMET Recent Labs  Lab 07/26/17 0449 07/27/17 0350 07/28/17 0334  NA 139 139 141  K 3.5 4.0 3.5  CL 107 105 103  CO2 _1 BUN 46* 44* 42*  CREATININE 1.69* 1.48* 1.39*  GLUCOSE 110* 115* 125*   Electrolytes Recent Labs  Lab 07/25/2017 0053  07/24/17 0432  07/26/17 0449 07/27/17 0350 07/28/17 0334  CALCIUM 7.8*   < > 8.2*   < > 8.6* 9.1 9.3  MG 1.6*  --  1.9  --   --  1.9  --   PHOS 2.7  --  4.8*  --   --  3.7  --    < > = values in this interval not displayed.   CBC Recent Labs  Lab 07/26/17 0449 07/27/17 0350 07/28/17 0334  WBC 7.7 7.7 9.1  HGB 9.5* 9.5* 10.0*  HCT 29.6* 29.9* 31.5*  PLT 281 298 316   Coag's No results for input(s): APTT, INR in the last 168 hours.  Sepsis Markers Recent Labs  Lab 07/24/17 0432  LATICACIDVEN 1.3   ABG Recent Labs  Lab 07/26/17 1445 07/27/17 0058 07/28/17 0859  PHART 7.497* 7.482* 7.576*  PCO2ART 31.7* 33.0 30.9*  PO2ART 67.0* 105 68.0*   Liver Enzymes Recent Labs  Lab 07/24/17 0432 07/25/17 0414  AST 131* 63*  ALT 457* 287*  ALKPHOS 137* 110  BILITOT 0.8 0.7  ALBUMIN 2.2* 2.2*   Cardiac Enzymes Recent Labs  Lab 07/09/2017 2034  TROPONINI 4.04*   Glucose Recent Labs  Lab 07/27/17 1246 07/27/17 1636 07/27/17 1952 07/27/17 2339 07/28/17 0414 07/28/17 0722  GLUCAP 108* 101* 107* 103* 120* 124*   Imaging Dg Chest Port 1 View  Result Date: 07/28/2017 CLINICAL DATA:  Shortness of Breath EXAM: PORTABLE  CHEST 1 VIEW COMPARISON:  July 27, 2017 FINDINGS: Central catheter tip is in the superior vena cava. Nasogastric tube tip and side port are below the diaphragm. No pneumothorax. There is a a stable right pleural effusion with consolidation in the right base. Left lung is clear. Heart is mildly enlarged with pulmonary vascularity within normal limits. No adenopathy. No bone lesions. IMPRESSION: Tube and catheter positions as described without pneumothorax. Stable consolidation right base with right pleural effusion. No new opacity. Stable cardiac prominence. Electronically Signed   By: Lowella Grip III M.D.   On: 07/28/2017 08:25   Dg Chest Port 1 View  Result Date: 07/27/2017 CLINICAL DATA:  Acute respiratory failure. EXAM: PORTABLE CHEST 1 VIEW COMPARISON:  07/26/2017 FINDINGS: The right IJ central venous catheter and NG tubes are stable. The cardiac silhouette, mediastinal and hilar contours are unchanged. Mild tortuosity and calcification of the thoracic aorta. Persistent right-sided pleural effusion with overlying atelectasis. Left lung is grossly clear. IMPRESSION: Persistent right-sided pleural effusion and overlying atelectasis. Stable support apparatus. Electronically Signed   By: Marijo Sanes M.D.   On: 07/27/2017 12:47   STUDIES:  CTA Chest 11/11 >> 2.9 cm right upper lobe cavitary spiculated nodule and 3.7 cm right lower lobe mass. Findings probably represent synchronous or primary/metastatic lung cancer. Distant metastatic disease is less likely. The right upper lobe lesion invades the hilum. There is a satellite nodule inferior to the right lower lobe lesion. Sub- carinal lymphadenopathy. No aortic dissection. Left common carotid artery origin fibrofatty plaque with moderate 50% stenosis. Cardiomegaly and severe coronary artery calcification.  CT A/P 11/11 >> Diffuse gallbladder wall thickening & extensive inflammation surrounding the pancreas. Inflammation is likely due to acute  pancreatitis with either reactive changes of the gallbladder or concurrent acute cholecystitis. No aortic dissection. Infrarenal abdominal aortic repair with patent bilateral aortofemoral bypass. Extensive atherosclerotic disease with severe proximal femoral artery stenosis, severe mid SMA stenosis, severe bilateral renal artery stenosis. Layering density within the gallbladder may represent small stones or vicarious excretion of contrast.  Small volume of ascites. [place] ABD 11/13 >> Three liver masses measuring up to 3.2 cm, favor metastatic disease given chest CT findings yesterday. Cholelithiasis. No visible choledocholithiasis or common bile duct dilatation.Prominent gallbladder wall edema. This could be reactive rather than cholecystitis given the gallbladder is not over dilated [facility] 11/16 >> multivessel disease (25-90%), occluded RCA with thrombus, left to right collaterals, LVEDP normal, no AV stensois  CULTURES: U/A 11/11 >> Negative  Blood Culture 11/11 >> negative Sputum11/11 >> normal flora BAL 11/15 >> 100 colonies mold >> ? Contaminant  BAL 11/15 >> stenotrophomonas maltophilia >>   CYTOLOGY  FOB 11/15 >> no malignant cells identified, acute inflammation   ANTIBIOTICS:  Vancomycin 11/11 >> 11/12,11/13 >> 11/14 Zosyn 11/11 >> 1/15 Ceftriaxone 11/15 >> 11/18 Levaquin 11/17 >>   SIGNIFICANT EVENTS: 11/11  Admitted with syncope, chest pain, brady/hypotensive, externally paced, pressors, intubated 11/12  Shock, brady, vagal 11/15  Bronch   LINES/TUBES: ETT 11/11 >> 11/16, 11/16 >>11/18 Right IJ 11/11 >>  R Radial Aline 11/11 >>11/18 NGT 11/18>>>  DISCUSSION: 66 year old male admitted 11/11 with chest pain, SOB.  Found to be hypoxic, hypotensive, and bradycardiac. Intubated in ED. Cardiology consulted.  Chest imaging concerning for malignancy with mets.  He needs further clarification of goals of care pertaining to possible intubation tracheostomy near future.  ASSESSMENT /  PLAN:  PULMONARY A: Acute Hypoxic Respiratory Failure  Failed Extubation 11/16 - stridor, poor effort  RUL Cavitary Spiculated  Nodule - CT Chest with 2.9 cm right upper lobe cavitary spiculated nodule and 3.7 cm right lower lobe mass highly suspicious for malignancy with mets H/O Tobacco Use  Pulmonary Edema  Stenotrophomonas in Sputum  P:   Pulmonary hygiene - IS, mobilize if mental status allows Duoneb Q6 + PRN  Completed solumedrol for stridor, monitor clinically and currently is on a Decadron stop date in place CXR 11/21, no significant change Add lasix x 2 doses 11/20 and repeated 1121 with good response FOB pathology negative, will need additional procedure for tissue sampling given CT appearance but would like patient to get through the acute phase of his illness and can work it up as outpatient, may start anti-coagulation now, will defer to cards  CARDIOVASCULAR A:  Bradycardia - ? AV nodal injury NSTEMI Septic vs Cardiogenic Shock, acidosis related likely  CAD s/p RCA stent 2013, AAA repair 2016 PAD AF - resolved, cards expects to reoccur  H/O HTN, HLD  P:  Tele monitoring  ASA Eliquis and Plavix restarted, heparin off Coreg 6.26 mg BID, amlodipine 69m QD Cardiology following, appreciate input  Atropine PRN for bradycardia  PRN hydralazine for SBP > 170 Continuous QTc monitoring ( Levaquin and Seroquel) Appreciate cardiology input  RENAL A:   Acute on Chronic Renal Insufficiency - improving  Urinary retention   Intake/Output Summary (Last 24 hours) at 07/28/2017 1104 Last data filed at 07/28/2017 0800 Gross per 24 hour  Intake 275.47 ml  Output 4185 ml  Net -3909.53 ml   Filed Weights   07/26/17 0401 07/27/17 0324 07/28/17 0500  Weight: 163 lb 9.3 oz (74.2 kg) 154 lb 5.2 oz (70 kg) 145 lb 8.1 oz (66 kg)   Lab Results  Component Value Date   CREATININE 1.39 (H) 07/28/2017   CREATININE 1.48 (H) 07/27/2017   CREATININE 1.69 (H) 07/26/2017   CREATININE  1.37 (H) 04/29/2015   CREATININE 1.42 (H) 01/29/2015   CREATININE 1.36 (H) 05/31/2013   Recent Labs  Lab 07/26/17 0449 07/27/17 0350 07/28/17 0334  K 3.5 4.0 3.5    P:   Add Lasix 40 mg IV x 2 doses on 07/27/2017 and repeat on 07/28/2017 Trend BMP / urinary output Place Foley  Add Flomax 0.4 Replace electrolytes as indicated Avoid nephrotoxic agents, ensure adequate renal perfusion KVO IVF  GASTROINTESTINAL A:   Transaminase elevation - resolving shock liver, MI contribution, UKoreagallbladder negative. AFP wnl. Met liver, primary lung? - FOB pathology negative, will need additional procedure Nutrition >> family concerns P:   NPO SLP evaluation for swallowing, not done due to altered mental state If goals of care is to continue aggressive treatment he will need a feeding tube  HEMATOLOGIC  A:   VTE Prophylaxis  Anemia P:  Trend CBC  Eliquis and plavix per cards Monitor for any obvious source of bleeding   INFECTIOUS A:   Afebrile  Stenotrophomonas in Sputum  P: Trend fever curve and WBC   Discontinue rocephin  Continue Levaquin Levaquin for steno Re culture as needed Follow cultures as above   ENDOCRINE A:   Hyperglycemia   HIGH cortisol response-poor outcome>? P:   CBG with SSI   NEUROLOGIC A:   Acute Metabolic Encephalopathy   H/O ETOH - ETOH 155  P:   RASS goal: 0 to -1  CIWA protocol   Avoiding precedex for now given bradycardia on admit  Continue folate/thiamine  Seroquel 50 mg BID, watch QT with levaquin on board as well  Clonazepam added on 07/26/2017 to decrease IV Ativan use. He tends to either be sedated or agitated. He may need a neurological consult in the future  FAMILY  - Updates: Brother at bedside 07/28/2017.  Goal is to have a family meeting today 07/28/2017 to determine course of action.  He has been on noninvasive mechanical ventilatory support for days shows no signs of coming off.  We will need to either reintubate with plan  for possible tracheostomy in the future and skilled nursing facility.  The other alternative is end-of-life discussions with goal of care being comfort.  App CCT 40 min  Richardson Landry Minor ACNP Maryanna Shape PCCM Pager [phone] till 1 pm If no answer page [phone] 07/28/2017, 10:57 AM  Attending Note:  66 year old alcoholic male with extensive PMH who presents with CP and in respiratory failure and alcohol withdrawal.  Patient was extubated but continued to fail.  Now on BiPAP for 3 days.  On exam, is arousable but airway protection is questionable at best.  I reviewed CXR myself, pulmonary edema noted.  Per RN, family would not want trach/peg or reintubation.  Attempted to get family for a meeting this AM but they can only make it in at 3 PM today.  Spoke with brother and confirmed plan.  If family wishes for full support then will intubate and likely trach next week.  If not then will need to discuss comfort measures.  The patient is critically ill with multiple organ systems failure and requires high complexity decision making for assessment and support, frequent evaluation and titration of therapies, application of advanced monitoring technologies and extensive interpretation of multiple databases.   Critical Care Time devoted to patient care services described in this note is  50  Minutes. This time reflects time of care of this signee Dr Jennet Maduro. This critical care time does not reflect procedure time, or teaching time or supervisory time of PA/NP/Med student/Med Resident etc but could involve care discussion time.  Rush Farmer, M.D. [facility] Pulmonary/Critical Care Medicine. Pager: [phone]. After hours pager: [phone].

## 2017-07-28 NOTE — Progress Notes (Signed)
eLink Physician-Brief Progress Note Patient Name: Adam Renner Sr. DOB: [birth year] MRN: [id number]   Date of Service  07/28/2017  HPI/Events of Note  Developed afib with RVR, borderline BP  eICU Interventions  Started amio. Already on eliquis.         Laverle Hobby 07/28/2017, 4:10 AM

## 2017-07-28 NOTE — Progress Notes (Signed)
SLP Cancellation Note  Patient Details Name: Adam Landin Sr. MRN: [id number] DOB: [birth year]   Cancelled treatment:        On Bipap. Continue efforts   Houston Siren 07/28/2017, 2:32 PM

## 2017-07-28 NOTE — Progress Notes (Signed)
Met with family on multiple occasions throughout the day.  Attempting to establish plan of care.  Patient would not want trach/peg regardless of chances to improve or not.  After a long discussion, decision was made to make patient a full DNR.  If no improvement by morning then will proceed with morphine and d/c of BiPAP and let patient expire comfortably.  Dr. Ashok Cordia notified of plan and will cover patient in the morning.  The patient is critically ill with multiple organ systems failure and requires high complexity decision making for assessment and support, frequent evaluation and titration of therapies, application of advanced monitoring technologies and extensive interpretation of multiple databases.   Critical Care Time devoted to patient care services described in this note is  45  Minutes. This time reflects time of care of this signee Dr Jennet Maduro. This critical care time does not reflect procedure time, or teaching time or supervisory time of PA/NP/Med student/Med Resident etc but could involve care discussion time.  Rush Farmer, M.D. [facility] Pulmonary/Critical Care Medicine. Pager: [phone]. After hours pager: [phone].

## 2017-07-29 ENCOUNTER — Inpatient Hospital Stay (HOSPITAL_COMMUNITY): Payer: Medicare HMO

## 2017-07-29 ENCOUNTER — Other Ambulatory Visit: Payer: Self-pay

## 2017-07-29 LAB — BASIC METABOLIC PANEL
ANION GAP: 10 (ref 5–15)
BUN: 43 mg/dL — ABNORMAL HIGH (ref 6–20)
CHLORIDE: 103 mmol/L (ref 101–111)
CO2: 30 mmol/L (ref 22–32)
Calcium: 9.1 mg/dL (ref 8.9–10.3)
Creatinine, Ser: 1.33 mg/dL — ABNORMAL HIGH (ref 0.61–1.24)
GFR calc non Af Amer: 54 mL/min — ABNORMAL LOW (ref >60)
Glucose, Bld: 128 mg/dL — ABNORMAL HIGH (ref 65–99)
Potassium: 3.2 mmol/L — ABNORMAL LOW (ref 3.5–5.1)
SODIUM: 143 mmol/L (ref 135–145)

## 2017-07-29 LAB — GLUCOSE, CAPILLARY
GLUCOSE-CAPILLARY: 112 mg/dL — AB (ref 65–99)
GLUCOSE-CAPILLARY: 122 mg/dL — AB (ref 65–99)
GLUCOSE-CAPILLARY: 96 mg/dL (ref 65–99)
Glucose-Capillary: 85 mg/dL (ref 65–99)
Glucose-Capillary: 84 mg/dL (ref 65–99)

## 2017-07-29 LAB — HEPATIC FUNCTION PANEL
ALT: 137 U/L — ABNORMAL HIGH (ref 17–63)
ALT: 128 U/L — AB (ref 17–63)
AST: 33 U/L (ref 15–41)
AST: 32 U/L (ref 15–41)
Albumin: 2.7 g/dL — ABNORMAL LOW (ref 3.5–5.0)
Albumin: 2.7 g/dL — ABNORMAL LOW (ref 3.5–5.0)
Alkaline Phosphatase: 106 U/L (ref 38–126)
Alkaline Phosphatase: 91 U/L (ref 38–126)
BILIRUBIN DIRECT: 0.2 mg/dL (ref 0.1–0.5)
BILIRUBIN DIRECT: 0.2 mg/dL (ref 0.1–0.5)
BILIRUBIN INDIRECT: 0.6 mg/dL (ref 0.3–0.9)
BILIRUBIN INDIRECT: 1 mg/dL — AB (ref 0.3–0.9)
BILIRUBIN TOTAL: 1.2 mg/dL (ref 0.3–1.2)
Total Bilirubin: 0.8 mg/dL (ref 0.3–1.2)
Total Protein: 5.9 g/dL — ABNORMAL LOW (ref 6.5–8.1)
Total Protein: 5.9 g/dL — ABNORMAL LOW (ref 6.5–8.1)

## 2017-07-29 LAB — CBC WITH DIFFERENTIAL/PLATELET
BASOS ABS: 0 10*3/uL (ref 0.0–0.1)
BASOS PCT: 0 %
Eosinophils Absolute: 0 10*3/uL (ref 0.0–0.7)
Eosinophils Relative: 0 %
HEMATOCRIT: 31.8 % — AB (ref 39.0–52.0)
HEMOGLOBIN: 10 g/dL — AB (ref 13.0–17.0)
Lymphocytes Relative: 8 %
Lymphs Abs: 1 10*3/uL (ref 0.7–4.0)
MCH: 31.1 pg (ref 26.0–34.0)
MCHC: 31.4 g/dL (ref 30.0–36.0)
MCV: 98.8 fL (ref 78.0–100.0)
MONOS PCT: 0 %
Monocytes Absolute: 0.1 10*3/uL (ref 0.1–1.0)
NEUTROS ABS: 11.6 10*3/uL — AB (ref 1.7–7.7)
NEUTROS PCT: 92 %
Platelets: 343 10*3/uL (ref 150–400)
RBC: 3.22 MIL/uL — AB (ref 4.22–5.81)
RDW: 14.9 % (ref 11.5–15.5)
WBC: 12.7 10*3/uL — AB (ref 4.0–10.5)

## 2017-07-29 LAB — BRAIN NATRIURETIC PEPTIDE: B Natriuretic Peptide: 3357.3 pg/mL — ABNORMAL HIGH (ref 0.0–100.0)

## 2017-07-29 LAB — TSH: TSH: 1.506 u[IU]/mL (ref 0.350–4.500)

## 2017-07-29 LAB — PHOSPHORUS: PHOSPHORUS: 4.3 mg/dL (ref 2.5–4.6)

## 2017-07-29 LAB — AMMONIA: Ammonia: 13 umol/L (ref 9–35)

## 2017-07-29 LAB — VITAMIN B12: Vitamin B-12: 1199 pg/mL — ABNORMAL HIGH (ref 180–914)

## 2017-07-29 LAB — T4, FREE: Free T4: 1.68 ng/dL — ABNORMAL HIGH (ref 0.61–1.12)

## 2017-07-29 LAB — MAGNESIUM: MAGNESIUM: 1.8 mg/dL (ref 1.7–2.4)

## 2017-07-29 MED ORDER — VITAMIN B-1 100 MG PO TABS
100.0000 mg | ORAL_TABLET | Freq: Every day | ORAL | Status: DC
  Administered 2017-08-01 – 2017-08-02 (×2): 100 mg via ORAL
  Filled 2017-07-29 (×4): qty 1

## 2017-07-29 MED ORDER — LEVOFLOXACIN IN D5W 750 MG/150ML IV SOLN
750.0000 mg | INTRAVENOUS | Status: AC
  Administered 2017-07-29 – 2017-07-30 (×2): 750 mg via INTRAVENOUS
  Filled 2017-07-29 (×2): qty 150

## 2017-07-29 MED ORDER — POTASSIUM CHLORIDE 10 MEQ/50ML IV SOLN
10.0000 meq | INTRAVENOUS | Status: AC
  Administered 2017-07-29 (×4): 10 meq via INTRAVENOUS
  Filled 2017-07-29 (×4): qty 50

## 2017-07-29 MED ORDER — THIAMINE HCL 100 MG/ML IJ SOLN
500.0000 mg | Freq: Three times a day (TID) | INTRAVENOUS | Status: AC
  Administered 2017-07-29 – 2017-08-01 (×9): 500 mg via INTRAVENOUS
  Filled 2017-07-29 (×9): qty 5

## 2017-07-29 MED ORDER — CARVEDILOL 6.25 MG PO TABS
18.7500 mg | ORAL_TABLET | Freq: Two times a day (BID) | ORAL | Status: DC
  Administered 2017-07-29 – 2017-08-02 (×6): 18.75 mg via ORAL
  Filled 2017-07-29 (×8): qty 1

## 2017-07-29 MED ORDER — AMIODARONE HCL 200 MG PO TABS
400.0000 mg | ORAL_TABLET | Freq: Two times a day (BID) | ORAL | Status: DC
  Administered 2017-07-29 – 2017-08-02 (×9): 400 mg via ORAL
  Filled 2017-07-29 (×10): qty 2

## 2017-07-29 NOTE — Progress Notes (Signed)
[facility] ADULT ICU REPLACEMENT PROTOCOL FOR AM LAB REPLACEMENT ONLY  The patient does apply for the [facility] Adult ICU Electrolyte Replacment Protocol based on the criteria listed below:   1. Is GFR >/= 40 ml/min? Yes.    Patient's GFR today is 54 2. Is urine output >/= 0.5 ml/kg/hr for the last 6 hours? Yes.   Patient's UOP is 1.40 ml/kg/hr 3. Is BUN < 60 mg/dL? Yes.    Patient's BUN today is 43 4. Abnormal electrolyte(s):Potassium 3.2 5. Ordered repletion with: Potassium per protocol 6. If a panic level lab has been reported, has the CCM MD in charge been notified? No..   Physician:    Adam Phenix 07/29/2017 5:22 AM

## 2017-07-29 NOTE — Progress Notes (Signed)
PULMONARY / CRITICAL CARE MEDICINE   Name: Adam Doyon Sr. MRN: [id number] DOB: June 25, 1951    ADMISSION DATE:  07/27/2017 CONSULTATION DATE:  07/24/2017  REFERRING MD:  Dr. Tomi Bamberger  CHIEF COMPLAINT:  Chest Pain   HISTORY OF PRESENT ILLNESS:   66 year old male with PMH of CAD s/p RCA stent 2013, AAA s/p open repair in 2016 with aortiobifemoral bypass graft, HTN, HLD, ETOH. Former smoker, quit 2013 with a 45 pack year history  Presents to ED on 11/11 after wife found patient face down for unknown time. When EMS arrived patient complaining of central chest pain/syncope. Family states he took an unknown amount of nitro tablets. EMS gave 9 mcg EPI and 1 mg Atropine for Bradycardia and Hypotension. Upon arrival to ED patient was diaphoretic and required non-rebreather. Intubated in ED. ABG 7.353/25.5/282. ETOH 155. BP Systolic 18-56, on external pacer, underlying HR 15. Levophed gtt.  Cardiology consulted. PCCM asked to admit.    When looking at [facility] bottle, patient only missing 4 tablets (family states they are sure he took one on Halloween).  Family reports for the last few months patient has been complaining of progressive dyspnea, over the last couple of weeks intermittent chest pain. Per family patient drinks heavily.     SUBJECTIVE: Remains on noninvasive mechanical ventilatory support is either agitated or sedated.  Await family to arrive for possible discontinuation of noninvasive mechanical ventilatory support VITAL SIGNS: BP 132/74   Pulse 71   Temp 98.6 F (37 C) (Axillary)   Resp 19   Ht _0  (1.778 m)   Wt 143 lb 15.4 oz (65.3 kg)   SpO2 100%   BMI 20.66 kg/m   HEMODYNAMICS: CVP:  [11 mmHg-12 mmHg] 11 mmHg  VENTILATOR SETTINGS: Vent Mode: BIPAP FiO2 (%):  [30 %-40 %] 30 % Set Rate:  [12 bmp] 12 bmp Vt Set:  [580 mL] 580 mL PEEP:  [5 cmH20] 5 cmH20  INTAKE / OUTPUT: I/O last 3 completed shifts: In: 1346.1 [I.V.:696.1; NG/GT:100; IV Piggyback:550] Out:  3149 [Urine:4160; Emesis/NG output:50]  PHYSICAL EXAMINATION: General: Ill-appearing male on noninvasive mechanical ventilatory support, poorly responsive HEENT: Currently full facemask from noninvasive mechanical respiratory support apparatus. PSY: Currently sedated Neuro: When not sedated is agitated CV: Heart sounds are regular PULM: Decreased breath sounds in the bases FW:YOVZ, non-tender, bsx4 active  Extremities: warm/dry, 1+ edema  Skin: no rashes or lesions   LABS:  BMET Recent Labs  Lab 07/27/17 0350 07/28/17 0334 07/29/17 0411  NA 139 141 143  K 4.0 3.5 3.2*  CL 105 103 103  CO2 _1 BUN 44* 42* 43*  CREATININE 1.48* 1.39* 1.33*  GLUCOSE 115* 125* 128*   Electrolytes Recent Labs  Lab 07/24/17 0432  07/27/17 0350 07/28/17 0334 07/29/17 0411  CALCIUM 8.2*   < > 9.1 9.3 9.1  MG 1.9  --  1.9  --  1.8  PHOS 4.8*  --  3.7  --  4.3   < > = values in this interval not displayed.   CBC Recent Labs  Lab 07/27/17 0350 07/28/17 0334 07/29/17 0411  WBC 7.7 9.1 12.7*  HGB 9.5* 10.0* 10.0*  HCT 29.9* 31.5* 31.8*  PLT 298 316 343   Coag's No results for input(s): APTT, INR in the last 168 hours. Sepsis Markers Recent Labs  Lab 07/24/17 0432  LATICACIDVEN 1.3   ABG Recent Labs  Lab 07/26/17 1445 07/27/17 0058 07/28/17 0859  PHART 7.497* 7.482* 7.576*  PCO2ART 31.7* 33.0 30.9*  PO2ART 67.0* 105 68.0*   Liver Enzymes Recent Labs  Lab 07/24/17 0432 07/25/17 0414 07/29/17 0411  AST 131* 63* 33  ALT 457* 287* 137*  ALKPHOS 137* 110 106  BILITOT 0.8 0.7 0.8  ALBUMIN 2.2* 2.2* 2.7*   Cardiac Enzymes Recent Labs  Lab 07/24/2017 2034  TROPONINI 4.04*   Glucose Recent Labs  Lab 07/28/17 1106 07/28/17 1519 07/28/17 2016 07/28/17 2322 07/29/17 0302 07/29/17 0752  GLUCAP 120* 113* 104* 98 112* 122*   Imaging Dg Chest Port 1 View  Result Date: 07/29/2017 CLINICAL DATA:  Respiratory failure and bradycardia EXAM: PORTABLE CHEST 1  VIEW COMPARISON:  July 28, 2017 FINDINGS: Nasogastric tube tip and side port are below the diaphragm. Central catheter tip is in the superior vena cava. No pneumothorax. There is persistent consolidation in the right base region with right pleural effusion. Lungs elsewhere clear. Heart is mildly enlarged with pulmonary vascularity within normal limits. No adenopathy. There is aortic atherosclerosis. No appreciable bone lesions. IMPRESSION: Tube and catheter positions as described without pneumothorax. Persistent right base consolidation with right pleural effusion. No new opacity. Stable cardiac prominence. There is aortic atherosclerosis. Aortic Atherosclerosis (ICD10-I70.0). Electronically Signed   By: Lowella Grip III M.D.   On: 07/29/2017 09:10   STUDIES:  CTA Chest 11/11 >> 2.9 cm right upper lobe cavitary spiculated nodule and 3.7 cm right lower lobe mass. Findings probably represent synchronous or primary/metastatic lung cancer. Distant metastatic disease is less likely. The right upper lobe lesion invades the hilum. There is a satellite nodule inferior to the right lower lobe lesion. Sub- carinal lymphadenopathy. No aortic dissection. Left common carotid artery origin fibrofatty plaque with moderate 50% stenosis. Cardiomegaly and severe coronary artery calcification.  CT A/P 11/11 >> Diffuse gallbladder wall thickening & extensive inflammation surrounding the pancreas. Inflammation is likely due to acute pancreatitis with either reactive changes of the gallbladder or concurrent acute cholecystitis. No aortic dissection. Infrarenal abdominal aortic repair with patent bilateral aortofemoral bypass. Extensive atherosclerotic disease with severe proximal femoral artery stenosis, severe mid SMA stenosis, severe bilateral renal artery stenosis. Layering density within the gallbladder may represent small stones or vicarious excretion of contrast.  Small volume of ascites. [place] ABD 11/13 >> Three liver  masses measuring up to 3.2 cm, favor metastatic disease given chest CT findings yesterday. Cholelithiasis. No visible choledocholithiasis or common bile duct dilatation.Prominent gallbladder wall edema. This could be reactive rather than cholecystitis given the gallbladder is not over dilated [facility] 11/16 >> multivessel disease (25-90%), occluded RCA with thrombus, left to right collaterals, LVEDP normal, no AV stensois  CULTURES: U/A 11/11 >> Negative  Blood Culture 11/11 >> negative Sputum11/11 >> normal flora BAL 11/15 >> 100 colonies mold >> ? Contaminant  BAL 11/15 >> stenotrophomonas maltophilia >>   CYTOLOGY  FOB 11/15 >> no malignant cells identified, acute inflammation   ANTIBIOTICS: Vancomycin 11/11 >> 11/12,11/13 >> 11/14 Zosyn 11/11 >> 1/15 Ceftriaxone 11/15 >> 11/18 Levaquin 11/17 >>   SIGNIFICANT EVENTS: 11/11  Admitted with syncope, chest pain, brady/hypotensive, externally paced, pressors, intubated 11/12  Shock, brady, vagal 11/15  Bronch   LINES/TUBES: ETT 11/11 >> 11/16, 11/16 >>11/18 Right IJ 11/11 >>  R Radial Aline 11/11 >>11/18 NGT 11/18>>>  DISCUSSION: 66 year old male admitted 11/11 with chest pain, SOB.  Found to be hypoxic, hypotensive, and bradycardiac. Intubated in ED. Cardiology consulted.  Chest imaging concerning for malignancy with mets.  He needs further clarification of goals of  care pertaining to possible intubation tracheostomy near future. 07/29/2017 when family arrives we will make further decisions concerning discontinuation of noninvasive mechanical ventilatory support and comfort care.  ASSESSMENT / PLAN:  PULMONARY A: Acute Hypoxic Respiratory Failure  Failed Extubation 11/16 - stridor, poor effort  RUL Cavitary Spiculated  Nodule - CT Chest with 2.9 cm right upper lobe cavitary spiculated nodule and 3.7 cm right lower lobe mass highly suspicious for malignancy with mets H/O Tobacco Use  Pulmonary Edema  Stenotrophomonas in Sputum   P:   Pulmonary hygiene - IS, mobilize if mental status allows Duoneb Q6 + PRN  Completed solumedrol for stridor, monitor clinically and currently is on a Decadron stop date in place CXR 11/21, no significant change Add lasix x 2 doses 11/20 and repeated 1121 with good response FOB pathology negative, will need additional procedure for tissue sampling given CT appearance but would like patient to get through the acute phase of his illness and can work it up as outpatient, may start anti-coagulation now, will defer to cards. Will determine with family today 07/29/2017 whether to DC BiPAP and proceed with comfort care.  CARDIOVASCULAR A:  Bradycardia - ? AV nodal injury NSTEMI Septic vs Cardiogenic Shock, acidosis related likely  CAD s/p RCA stent 2013, AAA repair 2016 PAD AF - resolved, cards expects to reoccur  H/O HTN, HLD  P:  Tele monitoring  ASA Eliquis and Plavix restarted, heparin off Coreg 6.26 mg BID, amlodipine 38m QD Cardiology following, appreciate input  Atropine PRN for bradycardia  PRN hydralazine for SBP > 170 Continuous QTc monitoring ( Levaquin and Seroquel) Appreciate cardiology input  RENAL A:   Acute on Chronic Renal Insufficiency - improving  Urinary retention   Filed Weights   07/27/17 0324 07/28/17 0500 07/29/17 0500  Weight: 154 lb 5.2 oz (70 kg) 145 lb 8.1 oz (66 kg) 143 lb 15.4 oz (65.3 kg)   Lab Results  Component Value Date   CREATININE 1.33 (H) 07/29/2017   CREATININE 1.39 (H) 07/28/2017   CREATININE 1.48 (H) 07/27/2017   CREATININE 1.37 (H) 04/29/2015   CREATININE 1.42 (H) 01/29/2015   CREATININE 1.36 (H) 05/31/2013   Recent Labs  Lab 07/27/17 0350 07/28/17 0334 07/29/17 0411  K 4.0 3.5 3.2*    Intake/Output Summary (Last 24 hours) at 07/29/2017 0919 Last data filed at 07/29/2017 0700 Gross per 24 hour  Intake 1070.62 ml  Output 2165 ml  Net -1094.38 ml    P:   Add Lasix 40 mg IV x 2 doses on 07/27/2017 and repeat on  07/28/2017 Trend BMP / urinary output Place Foley  Add Flomax 0.4 Replace electrolytes as indicated Avoid nephrotoxic agents, ensure adequate renal perfusion KVO IVF  GASTROINTESTINAL A:   Transaminase elevation - resolving shock liver, MI contribution, UKoreagallbladder negative. AFP wnl. Met liver, primary lung? - FOB pathology negative, will need additional procedure Nutrition >> family concerns P:   NPO SLP evaluation for swallowing, not done due to altered mental state If goals of care is to continue aggressive treatment he will need a feeding tube Currently on noninvasive mechanical ventilatory support.  HEMATOLOGIC A:   VTE Prophylaxis  Anemia P:  Trend CBC  Eliquis and plavix per cards Monitor for any obvious source of bleeding   INFECTIOUS A:   Afebrile  Stenotrophomonas in Sputum  P: Trend fever curve and WBC   Discontinue rocephin  Continue Levaquin Levaquin for steno Re culture as needed Follow cultures as above  ENDOCRINE A:   Hyperglycemia   HIGH cortisol response-poor outcome>? P:   CBG with SSI   NEUROLOGIC A:   Acute Metabolic Encephalopathy   H/O ETOH - ETOH 155  P:   RASS goal: 0 to -1  CIWA protocol   Avoiding precedex for now given bradycardia on admit  Continue folate/thiamine  Seroquel 50 mg BID, watch QT with levaquin on board as well  Clonazepam added on 07/26/2017 to decrease IV Ativan use. He tends to either be sedated or agitated.   FAMILY  - Updates: Brother at bedside 07/28/2017.  Goal is to have a family meeting today 07/28/2017 to determine course of action.  He has been on noninvasive mechanical ventilatory support for days shows no signs of coming off.  We will need to either reintubate with plan for possible tracheostomy in the future and skilled nursing facility.  The other alternative is end-of-life discussions with goal of care being comfort. 07/28/2017.decub met with the family members.  They established him as a  full DNR at that time.  It is noted that on 07/29/2017 if he should not improve and the possibility of a morphine drip along with discontinuation of noninvasive mechanical ventilatory support with goal of care being comfort but rather than curative.  App CCT 40 min  Richardson Landry Gottlieb Zuercher ACNP Maryanna Shape PCCM Pager [phone] till 1 pm If no answer page [phone] 07/29/2017, 9:16 AM

## 2017-07-29 NOTE — Progress Notes (Addendum)
Progress Note  Patient Name: Adam Pollitt Sr. Date of Encounter: 07/29/2017  Primary Cardiologist: Claiborne Billings  Subjective   Somnolent but awakens to voice, but did not speak.  Inpatient Medications    Scheduled Meds: . amLODipine  10 mg Oral Daily  . apixaban  5 mg Oral BID  . carvedilol  12.5 mg Oral BID WC  . clonazePAM  1 mg Per Tube BID  . clopidogrel  75 mg Oral Daily  . folic acid  1 mg Oral Daily  . ipratropium-albuterol  3 mL Nebulization Q6H  . lactulose  30 g Oral Daily  . mouth rinse  15 mL Mouth Rinse q12n4p  . pantoprazole sodium  40 mg Per Tube QHS  . QUEtiapine  50 mg Oral BID  . sodium chloride flush  10-40 mL Intracatheter Q12H  . tamsulosin  0.4 mg Oral Daily  . thiamine  100 mg Oral Daily   Continuous Infusions: . sodium chloride Stopped (07/21/17 1607)  . sodium chloride    . amiodarone 30 mg/hr (07/29/17 0413)  . feeding supplement (VITAL AF 1.2 CAL) Stopped (07/26/17 1417)  . levofloxacin (LEVAQUIN) IV    . potassium chloride 10 mEq (07/29/17 0805)   PRN Meds: sodium chloride, [CANCELED] Place/Maintain arterial line **AND** sodium chloride, albuterol, atropine, hydrALAZINE, ipratropium-albuterol, LORazepam, sodium chloride flush   Vital Signs    Vitals:   07/29/17 0600 07/29/17 0700 07/29/17 0750 07/29/17 0800  BP: (!) 134/121 121/66 125/83 132/74  Pulse: 72 63 67 65  Resp: (!) 25 17 (!) 24 20  Temp:   98.6 F (37 C)   TempSrc:   Axillary   SpO2: 100% 100% 100% 100%  Weight:      Height:        Intake/Output Summary (Last 24 hours) at 07/29/2017 0828 Last data filed at 07/29/2017 0700 Gross per 24 hour  Intake 1070.62 ml  Output 2165 ml  Net -1094.38 ml    I/O since admission: +8970  Filed Weights   07/27/17 0324 07/28/17 0500 07/29/17 0500  Weight: 154 lb 5.2 oz (70 kg) 145 lb 8.1 oz (66 kg) 143 lb 15.4 oz (65.3 kg)    Telemetry    Sinus Rhythm today at 66- Personally Reviewed  ECG    ECG (independently read  by me): 07/27/17: AF at 101; ST depression V2-6, I aVL  Will re-check today.  Physical Exam   BP 132/74   Pulse 65   Temp 98.6 F (37 C) (Axillary)   Resp 20   Ht 5\' 10"  (1.778 m)   Wt 143 lb 15.4 oz (65.3 kg)   SpO2 100%   BMI 20.66 kg/m  General: Alert, oriented, no distress.  Skin: normal turgor, no rashes, warm and dry HEENT: Normocephalic, atraumatic. Pupils equal round and reactive to light; sclera anicteric; extraocular muscles intact;  Nose without nasal septal hypertrophy Mouth/Parynx benign; Neck: No JVD, no carotid bruits; normal carotid upstroke Lungs: clear to ausculatation and percussion; no wheezing or rales Chest wall: without tenderness to palpitation Heart: PMI not displaced, RRR rate now in the 60s, s1 s2 normal, 1/6 systolic murmur, no diastolic murmur, no rubs, gallops, thrills, or heaves Abdomen: soft, nontender; no hepatosplenomehaly, BS+; abdominal aorta nontender and not dilated by palpation. Back: no CVA tenderness Pulses 2+ Musculoskeletal: full range of motion, normal strength, no joint deformities Extremities: no clubbing cyanosis or edema, Homan's sign negative  Neurologic: grossly nonfocal; Cranial nerves grossly wnl Psychologic: Normal mood and affect  Labs    Chemistry Recent Labs  Lab 07/24/17 0432 07/25/17 0414  07/27/17 0350 07/28/17 0334 07/29/17 0411  NA 134* 136   < > 139 141 143  K 3.9 4.0   < > 4.0 3.5 3.2*  CL 103 106   < > 105 103 103  CO2 20* 22   < > 26 29 30   GLUCOSE 132* 130*   < > 115* 125* 128*  BUN 30* 42*   < > 44* 42* 43*  CREATININE 1.90* 1.72*   < > 1.48* 1.39* 1.33*  CALCIUM 8.2* 8.4*   < > 9.1 9.3 9.1  PROT 5.7* 5.4*  --   --   --  5.9*  ALBUMIN 2.2* 2.2*  --   --   --  2.7*  AST 131* 63*  --   --   --  33  ALT 457* 287*  --   --   --  137*  ALKPHOS 137* 110  --   --   --  106  BILITOT 0.8 0.7  --   --   --  0.8  GFRNONAA 35* 40*   < > 48* 51* 54*  GFRAA 41* 46*   < > 55* 59* >60  ANIONGAP 11 8   < >  8 9 10    < > = values in this interval not displayed.     Hematology Recent Labs  Lab 07/27/17 0350 07/28/17 0334 07/29/17 0411  WBC 7.7 9.1 12.7*  RBC 3.07* 3.23* 3.22*  HGB 9.5* 10.0* 10.0*  HCT 29.9* 31.5* 31.8*  MCV 97.4 97.5 98.8  MCH 30.9 31.0 31.1  MCHC 31.8 31.7 31.4  RDW 14.3 14.3 14.9  PLT 298 316 343    Cardiac Enzymes Recent Labs  Lab 07/27/2017 2034  TROPONINI 4.04*   No results for input(s): TROPIPOC in the last 168 hours.   BNP Recent Labs  Lab 07/20/2017 2034 07/24/17 0432  BNP 4,018.6* 3,789.9*     DDimer No results for input(s): DDIMER in the last 168 hours.  Lipid Panel     Component Value Date/Time   CHOL 169 01/29/2015 0846   TRIG 81 01/29/2015 0846   HDL 69 01/29/2015 0846   CHOLHDL 2.4 01/29/2015 0846   VLDL 16 01/29/2015 0846   LDLCALC 84 01/29/2015 0846    Radiology    Dg Chest Port 1 View  Result Date: 07/28/2017 CLINICAL DATA:  Shortness of Breath EXAM: PORTABLE CHEST 1 VIEW COMPARISON:  July 27, 2017 FINDINGS: Central catheter tip is in the superior vena cava. Nasogastric tube tip and side port are below the diaphragm. No pneumothorax. There is a a stable right pleural effusion with consolidation in the right base. Left lung is clear. Heart is mildly enlarged with pulmonary vascularity within normal limits. No adenopathy. No bone lesions. IMPRESSION: Tube and catheter positions as described without pneumothorax. Stable consolidation right base with right pleural effusion. No new opacity. Stable cardiac prominence. Electronically Signed   By: Lowella Grip III M.D.   On: 07/28/2017 08:25   Dg Chest Port 1 View  Result Date: 07/27/2017 CLINICAL DATA:  Acute respiratory failure. EXAM: PORTABLE CHEST 1 VIEW COMPARISON:  07/26/2017 FINDINGS: The right IJ central venous catheter and NG tubes are stable. The cardiac silhouette, mediastinal and hilar contours are unchanged. Mild tortuosity and calcification of the thoracic aorta.  Persistent right-sided pleural effusion with overlying atelectasis. Left lung is grossly clear. IMPRESSION: Persistent right-sided pleural effusion and overlying atelectasis. Stable  support apparatus. Electronically Signed   By: Marijo Sanes M.D.   On: 07/27/2017 12:47    Cardiac Studies   Echo 07/19/2017   - Left ventricle: The cavity size was normal. There was moderate concentric hypertrophy. Systolic function was normal. The estimated ejection fraction was in the range of 60% to 65%. Wall motion was normal; there were no regional wall motion abnormalities. - Mitral valve: Calcified annulus. - Right ventricle: Systolic function was moderately reduced. - Tricuspid valve: There was mild regurgitation. - Pericardium, extracardiac: The pericardium appears thickened at the apex as well as the apical RV wall with small pericardial effusion over the apex. Suggest cardiac MRI to evaluate pericardium further.   Cath 08/01/2017  Ost 1st Mrg lesion is 50% stenosed.  Ost Cx to Mid Cx lesion is 25% stenosed.  Prox LAD to Mid LAD lesion is 25% stenosed.  Ost RCA to Prox RCA lesion is 90% stenosed.  Mid RCA lesion is 99% stenosed.  Ost RPDA to RPDA lesion is 90% stenosed.  Previously placed Dist RCA stent (unknown type) is widely patent.  LV end diastolic pressure is normal.  There is no aortic valve stenosis.  Subtotally occluded RCA with thrombus, left to right collaterals. Continue medical therapy.   Coronary Diagrams   Diagnostic Diagram           Patient Profile     66 y.o. male with CAD s/p RCA PCI in 2013, PAD (AAA, aortobifem bypass in 2016), hypertension and bradycardia admitted 11/11 with syncope.  Underwent LHC and found to have subtotal occlusion with heavy thrombus burden in the right coronary artery (with formed left to right collaterals), deemed best for medical therapy.He also had hypoxic respiratory failure, extubated  11/18.    Assessment & Plan    1. NSTEMI: peak troponin 29. No recurrent chest pain. Medical therapy for CAD. 2. AF: Converted to NSR on iv amiodarone. Since able to take oral meds will transition to oral amiodarone 400 mg bid and titrate coreg to 18.75 mg bid. 3. Respiratory: now on BiPAP post extubation. 4. Probable residual diastolic heart failure with EF 60 - 65% 5 Renal insufficiency: Cr improved to 1.33 6. Anemia 7. Lung Mass 8. Hyperlipidemia 9. LFTelevation:  Improved; will f/u   Time spent 35 minutes  Signed, Troy Sine, MD, [facility] 07/29/2017, 8:28 AM

## 2017-07-30 ENCOUNTER — Inpatient Hospital Stay (HOSPITAL_COMMUNITY): Payer: Medicare HMO

## 2017-07-30 LAB — BASIC METABOLIC PANEL
ANION GAP: 8 (ref 5–15)
BUN: 35 mg/dL — ABNORMAL HIGH (ref 6–20)
CALCIUM: 9.1 mg/dL (ref 8.9–10.3)
CO2: 30 mmol/L (ref 22–32)
Chloride: 107 mmol/L (ref 101–111)
Creatinine, Ser: 1.34 mg/dL — ABNORMAL HIGH (ref 0.61–1.24)
GFR, EST NON AFRICAN AMERICAN: 54 mL/min — AB (ref >60)
Glucose, Bld: 117 mg/dL — ABNORMAL HIGH (ref 65–99)
POTASSIUM: 3.4 mmol/L — AB (ref 3.5–5.1)
SODIUM: 145 mmol/L (ref 135–145)

## 2017-07-30 LAB — CBC WITH DIFFERENTIAL/PLATELET
BASOS ABS: 0 10*3/uL (ref 0.0–0.1)
BASOS PCT: 0 %
EOS PCT: 0 %
Eosinophils Absolute: 0 10*3/uL (ref 0.0–0.7)
HCT: 33.1 % — ABNORMAL LOW (ref 39.0–52.0)
Hemoglobin: 10.1 g/dL — ABNORMAL LOW (ref 13.0–17.0)
LYMPHS PCT: 6 %
Lymphs Abs: 0.9 10*3/uL (ref 0.7–4.0)
MCH: 30.6 pg (ref 26.0–34.0)
MCHC: 30.5 g/dL (ref 30.0–36.0)
MCV: 100.3 fL — ABNORMAL HIGH (ref 78.0–100.0)
MONO ABS: 0.6 10*3/uL (ref 0.1–1.0)
Monocytes Relative: 4 %
Neutro Abs: 12.5 10*3/uL — ABNORMAL HIGH (ref 1.7–7.7)
Neutrophils Relative %: 90 %
PLATELETS: 329 10*3/uL (ref 150–400)
RBC: 3.3 MIL/uL — AB (ref 4.22–5.81)
RDW: 14.7 % (ref 11.5–15.5)
WBC: 13.9 10*3/uL — AB (ref 4.0–10.5)

## 2017-07-30 LAB — GLUCOSE, CAPILLARY
GLUCOSE-CAPILLARY: 70 mg/dL (ref 65–99)
GLUCOSE-CAPILLARY: 61 mg/dL — AB (ref 65–99)
GLUCOSE-CAPILLARY: 78 mg/dL (ref 65–99)
Glucose-Capillary: 65 mg/dL (ref 65–99)
Glucose-Capillary: 72 mg/dL (ref 65–99)
Glucose-Capillary: 76 mg/dL (ref 65–99)
Glucose-Capillary: 92 mg/dL (ref 65–99)

## 2017-07-30 LAB — PHOSPHORUS: Phosphorus: 3.3 mg/dL (ref 2.5–4.6)

## 2017-07-30 LAB — MAGNESIUM: MAGNESIUM: 1.8 mg/dL (ref 1.7–2.4)

## 2017-07-30 MED ORDER — SODIUM CHLORIDE 0.9 % IV SOLN
INTRAVENOUS | Status: AC
  Administered 2017-07-30: 23:00:00 via INTRAVENOUS

## 2017-07-30 MED ORDER — POTASSIUM CHLORIDE CRYS ER 20 MEQ PO TBCR
40.0000 meq | EXTENDED_RELEASE_TABLET | Freq: Every day | ORAL | Status: DC
  Administered 2017-07-30 – 2017-08-02 (×4): 40 meq via ORAL
  Filled 2017-07-30 (×4): qty 2

## 2017-07-30 MED ORDER — DEXTROSE 50 % IV SOLN
INTRAVENOUS | Status: AC
  Administered 2017-07-30: 50 mL via INTRAVENOUS
  Filled 2017-07-30: qty 50

## 2017-07-30 MED ORDER — FUROSEMIDE 10 MG/ML IJ SOLN
40.0000 mg | Freq: Two times a day (BID) | INTRAMUSCULAR | Status: DC
  Administered 2017-07-30 – 2017-08-02 (×5): 40 mg via INTRAVENOUS
  Filled 2017-07-30 (×7): qty 4

## 2017-07-30 MED ORDER — DEXTROSE 50 % IV SOLN
1.0000 | Freq: Once | INTRAVENOUS | Status: AC
  Administered 2017-07-30: 50 mL via INTRAVENOUS

## 2017-07-30 NOTE — Progress Notes (Signed)
RT at bedside to evaluate pt for bipap and pt is restrained to the bed times 2 arms. Respiratory protocol that a pt cannot be placed on bipap with them being tied down or restrained due to them not being able to pull their masks off to protect their own airway if they needed to. This pt cannot pull his mask off so RT cannot use bipap at this time. Rt will continue to follow per protocol.

## 2017-07-30 NOTE — Progress Notes (Signed)
PULMONARY / CRITICAL CARE MEDICINE   Name: Adam Czajkowski Sr. MRN: [id number] DOB: 18-Mar-1951    ADMISSION DATE:  07/14/2017 CONSULTATION DATE:  08/04/2017  REFERRING MD:  Dr. Tomi Bamberger  CHIEF COMPLAINT:  Chest Pain   HISTORY OF PRESENT ILLNESS:   66 year old male with PMH of CAD s/p RCA stent 2013, AAA s/p open repair in 2016 with aortiobifemoral bypass graft, HTN, HLD, ETOH. Former smoker, quit 2013 with a 45 pack year history  Presents to ED on 11/11 after wife found patient face down for unknown time. When EMS arrived patient complaining of central chest pain/syncope. Family states he took an unknown amount of nitro tablets. EMS gave 9 mcg EPI and 1 mg Atropine for Bradycardia and Hypotension. Upon arrival to ED patient was diaphoretic and required non-rebreather. Intubated in ED. ABG 7.353/25.5/282. ETOH 155. BP Systolic 62-70, on external pacer, underlying HR 15. Levophed gtt.  Cardiology consulted. PCCM asked to admit.    When looking at [facility] bottle, patient only missing 4 tablets (family states they are sure he took one on Halloween).  Family reports for the last few months patient has been complaining of progressive dyspnea, over the last couple of weeks intermittent chest pain. Per family patient drinks heavily.     SUBJECTIVE: More awake and alert.  Attempt liberation from noninvasive mechanical ventilatory support at this time.  If he fails to resume noninvasive mechanical ventilatory support until such a time family makes a final decision as to end-of-life. VITAL SIGNS: BP 126/73   Pulse 73   Temp 97.8 F (36.6 C) (Axillary)   Resp (!) 21   Ht _0  (1.778 m)   Wt 139 lb 8.8 oz (63.3 kg)   SpO2 97%   BMI 20.02 kg/m   HEMODYNAMICS: CVP:  [13 mmHg-15 mmHg] 13 mmHg  VENTILATOR SETTINGS: Vent Mode: PCV;Other (Comment) FiO2 (%):  [30 %] 30 % Set Rate:  [12 bmp] 12 bmp PEEP:  [5 cmH20] 5 cmH20  INTAKE / OUTPUT: I/O last 3 completed shifts: In: 940.5 [I.V.:260.5;  NG/GT:330; IV Piggyback:350] Out: 2600 [Urine:2550; Emesis/NG output:50]  PHYSICAL EXAMINATION: General: Frail elderly male currently on noninvasive mechanical ventilatory support HEENT: No JVD appreciated no lymphadenopathy PSY: Strange affect Neuro: Follows some commands moves all extremities x4. CV: Currently sinus rhythm he is on amiodarone p.o. along with Coreg PULM: Decreased breath sounds at the base.  He is off noninvasive mechanical ventilatory support at current time.  He appears to be tolerating being on just supplemental oxygen at this time. JJ:KKXF, non-tender, bsx4 active he remains n.p.o. Extremities: warm/dry, decreased edema  Skin: no rashes or lesions    LABS:  BMET Recent Labs  Lab 07/28/17 0334 07/29/17 0411 07/30/17 0549  NA 141 143 145  K 3.5 3.2* 3.4*  CL 103 103 107  CO2 _1 BUN 42* 43* 35*  CREATININE 1.39* 1.33* 1.34*  GLUCOSE 125* 128* 117*   Electrolytes Recent Labs  Lab 07/27/17 0350 07/28/17 0334 07/29/17 0411 07/30/17 0549  CALCIUM 9.1 9.3 9.1 9.1  MG 1.9  --  1.8 1.8  PHOS 3.7  --  4.3 3.3   CBC Recent Labs  Lab 07/28/17 0334 07/29/17 0411 07/30/17 0549  WBC 9.1 12.7* 13.9*  HGB 10.0* 10.0* 10.1*  HCT 31.5* 31.8* 33.1*  PLT 316 343 329   Coag's No results for input(s): APTT, INR in the last 168 hours. Sepsis Markers Recent Labs  Lab 07/24/17 0432  LATICACIDVEN 1.3  ABG Recent Labs  Lab 07/26/17 1445 07/27/17 0058 07/28/17 0859  PHART 7.497* 7.482* 7.576*  PCO2ART 31.7* 33.0 30.9*  PO2ART 67.0* 105 68.0*   Liver Enzymes Recent Labs  Lab 07/25/17 0414 07/29/17 0411 07/29/17 0949  AST 63* 33 32  ALT 287* 137* 128*  ALKPHOS 110 106 91  BILITOT 0.7 0.8 1.2  ALBUMIN 2.2* 2.7* 2.7*   Cardiac Enzymes Recent Labs  Lab 07/16/2017 2034  TROPONINI 4.04*   Glucose Recent Labs  Lab 07/29/17 1229 07/29/17 1645 07/29/17 2044 07/30/17 0006 07/30/17 0359 07/30/17 0807  GLUCAP 96 85 84 76 65 70    Imaging Dg Chest Port 1 View  Result Date: 07/30/2017 CLINICAL DATA:  Respiratory failure. EXAM: PORTABLE CHEST 1 VIEW COMPARISON:  Chest x-ray from yesterday. FINDINGS: Unchanged positioning of the right internal jugular central venous catheter. The enteric tube has been retracted, with the tip now in the distal esophagus. The patient is rotated to the right on today's study. Stable cardiomediastinal silhouette. Normal pulmonary vascularity. Stable small right pleural effusion with adjacent patchy opacities. The left lung is clear. No pneumothorax. No acute osseous abnormality. IMPRESSION: 1. Interval retraction of the enteric tube, with the tip now in the distal esophagus. Recommend advancement. 2. Stable small right pleural effusion with adjacent patchy opacities, atelectasis versus infiltrate. Electronically Signed   By: Titus Dubin M.D.   On: 07/30/2017 07:38   STUDIES:  CTA Chest 11/11 >> 2.9 cm right upper lobe cavitary spiculated nodule and 3.7 cm right lower lobe mass. Findings probably represent synchronous or primary/metastatic lung cancer. Distant metastatic disease is less likely. The right upper lobe lesion invades the hilum. There is a satellite nodule inferior to the right lower lobe lesion. Sub- carinal lymphadenopathy. No aortic dissection. Left common carotid artery origin fibrofatty plaque with moderate 50% stenosis. Cardiomegaly and severe coronary artery calcification.  CT A/P 11/11 >> Diffuse gallbladder wall thickening & extensive inflammation surrounding the pancreas. Inflammation is likely due to acute pancreatitis with either reactive changes of the gallbladder or concurrent acute cholecystitis. No aortic dissection. Infrarenal abdominal aortic repair with patent bilateral aortofemoral bypass. Extensive atherosclerotic disease with severe proximal femoral artery stenosis, severe mid SMA stenosis, severe bilateral renal artery stenosis. Layering density within the  gallbladder may represent small stones or vicarious excretion of contrast.  Small volume of ascites. [place] ABD 11/13 >> Three liver masses measuring up to 3.2 cm, favor metastatic disease given chest CT findings yesterday. Cholelithiasis. No visible choledocholithiasis or common bile duct dilatation.Prominent gallbladder wall edema. This could be reactive rather than cholecystitis given the gallbladder is not over dilated [facility] 11/16 >> multivessel disease (25-90%), occluded RCA with thrombus, left to right collaterals, LVEDP normal, no AV stensois  CULTURES: U/A 11/11 >> Negative  Blood Culture 11/11 >> negative Sputum11/11 >> normal flora BAL 11/15 >> 100 colonies mold >> ? 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  Found to be hypoxic, hypotensive, and bradycardiac. Intubated in ED. Cardiology  consulted.  Chest imaging concerning for malignancy with mets.  He needs further clarification of goals of care pertaining to possible intubation tracheostomy near future. 07/29/2017 when family arrives we will make further decisions concerning discontinuation of noninvasive mechanical ventilatory support and comfort care. 07/30/2017 we will try off BiPAP as he is a negative I&O for 3 days if he is more alert.  He may come off noninvasive mechanical ventilatory support without dire effects at this time.  We will attempt this maneuver if it fails we will place him  back on BiPAP and have continued discussions with the family concerning end-of-life.  ASSESSMENT / PLAN:  PULMONARY A: Acute Hypoxic Respiratory Failure  Failed Extubation 11/16 - stridor, poor effort  RUL Cavitary Spiculated  Nodule - CT Chest with 2.9 cm right upper lobe cavitary spiculated nodule and 3.7 cm right lower lobe mass highly suspicious for squamous cell cancer primary H/O Tobacco Use  Pulmonary Edema  Stenotrophomonas in Sputum  P:   Pulmonary hygiene - IS, mobilize if mental status allows Duoneb Q6 + PRN  Completed solumedrol for stridor, monitor clinically and currently is on a Decadron stop date in place CXR 11/21, no significant change Add lasix x 2 doses 11/20 and repeated 1121 with good response FOB pathology consistent with squamous cell carcinoma, wWill determine with continued family conversation about removal of BiPAP and comfort care.  He may come off BiPAP 07/30/2017 successfully.  We will try off BiPAP if he fails will place on BiPAP until family arrives. Currently off noninvasive mechanical ventilatory support. CARDIOVASCULAR A:  Bradycardia - ? AV nodal injury NSTEMI Septic vs Cardiogenic Shock, acidosis related likely  CAD s/p RCA stent 2013, AAA repair 2016 PAD AF - resolved, cards expects to reoccur  H/O HTN, HLD  P:  Tele monitoring  ASA Eliquis and Plavix restarted, heparin off Coreg 6.26 mg BID, amlodipine 61m QD Cardiology following, appreciate input  Atropine PRN for bradycardia  PRN hydralazine for SBP > 170 Continuous QTc monitoring ( Levaquin and Seroquel) Appreciate cardiology input  RENAL A:   Acute on Chronic Renal Insufficiency - improving  Urinary retention   Filed Weights   07/28/17 0500 07/29/17 0500 07/30/17 0500  Weight: 145 lb 8.1 oz (66 kg) 143 lb 15.4 oz (65.3 kg) 139 lb 8.8 oz (63.3 kg)   Lab Results  Component Value Date   CREATININE 1.34 (H) 07/30/2017   CREATININE 1.33 (H) 07/29/2017   CREATININE 1.39 (H)  07/28/2017   CREATININE 1.37 (H) 04/29/2015   CREATININE 1.42 (H) 01/29/2015   CREATININE 1.36 (H) 05/31/2013   Recent Labs  Lab 07/28/17 0334 07/29/17 0411 07/30/17 0549  K 3.5 3.2* 3.4*    Intake/Output Summary (Last 24 hours) at 07/30/2017 05003Last data filed at 07/30/2017 0800 Gross per 24 hour  Intake 390.1 ml  Output 1350 ml  Net -959.9 ml    P:   Add Lasix 40 mg IV x 2 doses on 07/27/2017 and repeat on 07/28/2017 repeat on 07/30/2017 per cardiology Trend BMP / urinary output Place Foley  Add Flomax 0.4 Replace electrolytes as indicated Avoid nephrotoxic agents, ensure adequate renal perfusion KVO IVF Replete electrolytes as needed  GASTROINTESTINAL A:   Transaminase elevation - resolving shock liver, MI contribution, UKoreagallbladder negative. AFP wnl. Met liver, primary lung? - FOB pathology consistent with squamous cell cancer. Nutrition >> family concerns P:   NPO SLP evaluation for swallowing, not done due to altered mental state If goals of care is  to continue aggressive treatment he will need a feeding tube Currently on noninvasive mechanical ventilatory support. If he comes off of noninvasive mechanical ventilatory support will attempt diet at that time.  HEMATOLOGIC Recent Labs    07/29/17 0411 07/30/17 0549  HGB 10.0* 10.1*   A:   VTE Prophylaxis  Anemia P:  Trend CBC  Eliquis and plavix per cards Monitor for any obvious source of bleeding   INFECTIOUS A:   Afebrile  Stenotrophomonas in Sputum  P: Trend fever curve and WBC   Discontinue rocephin  Discontinue Levaquin 07/30/2017 Levaquin for steno Re culture as needed Follow cultures as above   ENDOCRINE CBG (last 3)  Recent Labs    07/30/17 0006 07/30/17 0359 07/30/17 0807  GLUCAP 76 65 70   A:   Hyperglycemia   HIGH cortisol response-poor outcome>? P:   CBG with SSI   NEUROLOGIC A:   Acute Metabolic Encephalopathy   H/O ETOH - ETOH 155  P:   RASS goal: 0 to -1   CIWA protocol   Avoiding precedex for now given bradycardia on admit  Continue folate/thiamine  Seroquel 50 mg BID, watch QT with levaquin on board as well  Clonazepam added on 07/26/2017 to decrease IV Ativan use. He tends to either be sedated or agitated. 07/30/2017 more awake and cooperative.  FAMILY  - Updates: Brother at bedside 07/28/2017.  Goal is to have a family meeting today 07/28/2017 to determine course of action.  He has been on noninvasive mechanical ventilatory support for days shows no signs of coming off.  We will need to either reintubate with plan for possible tracheostomy in the future and skilled nursing facility.  The other alternative is end-of-life discussions with goal of care being comfort. 07/28/2017.decub met with the family members.  They established him as a full DNR at that time.  It is noted that on 07/29/2017 if he should not improve and the possibility of a morphine drip along with discontinuation of noninvasive mechanical ventilatory support with goal of care being comfort but rather than curative. 07/30/2017.  9:20 AM currently no family at the bedside.  The plan is to stop noninvasive mechanical ventilatory support and quickly turned towards a palliative care approach.  Currently he is on minimal support with BiPAP.  We will try off BiPAP if he decompensates we will restart BiPAP and and have further discussions with the family. 07/30/2017 he has tolerated being liberated from noninvasive mechanical support.  We will plan on moving him to the floor and to the [facility] hospitalist service in the a.m.  App CCT 40 min  Adam Lopez ACNP Maryanna Shape PCCM Pager [phone] till 1 pm If no answer page [phone] 07/30/2017, 9:18 AM

## 2017-07-30 NOTE — Progress Notes (Signed)
SLP Cancellation Note  Patient Details Name: Adam Chiarelli Sr. MRN: [id number] DOB: [birth year]   Cancelled treatment:       Reason Eval/Treat Not Completed: Medical issues which prohibited therapy - pt remains on BiPAP. SLP to sign off at this time - please reorder when ready.   Germain Osgood 07/30/2017, 8:19 AM  Germain Osgood, M.A. CCC-SLP [phone]

## 2017-07-30 NOTE — Progress Notes (Signed)
Progress Note  Patient Name: Adam Springs Sr. Date of Encounter: 07/30/2017  Primary Cardiologist: Dr. Claiborne Billings  Subjective   Denies pain or discomfort.   Inpatient Medications    Scheduled Meds: . amiodarone  400 mg Oral BID  . amLODipine  10 mg Oral Daily  . apixaban  5 mg Oral BID  . carvedilol  18.75 mg Oral BID WC  . clonazePAM  1 mg Per Tube BID  . clopidogrel  75 mg Oral Daily  . folic acid  1 mg Oral Daily  . ipratropium-albuterol  3 mL Nebulization Q6H  . mouth rinse  15 mL Mouth Rinse q12n4p  . pantoprazole sodium  40 mg Per Tube QHS  . QUEtiapine  50 mg Oral BID  . sodium chloride flush  10-40 mL Intracatheter Q12H  . tamsulosin  0.4 mg Oral Daily  . [START ON 08/01/2017] thiamine  100 mg Oral Daily   Continuous Infusions: . sodium chloride Stopped (07/21/17 1607)  . sodium chloride    . feeding supplement (VITAL AF 1.2 CAL) Stopped (07/26/17 1417)  . levofloxacin (LEVAQUIN) IV Stopped (07/29/17 1816)  . thiamine injection Stopped (07/30/17 0240)   PRN Meds: sodium chloride, [CANCELED] Place/Maintain arterial line **AND** sodium chloride, atropine, hydrALAZINE, ipratropium-albuterol, LORazepam, sodium chloride flush   Vital Signs    Vitals:   07/30/17 0500 07/30/17 0600 07/30/17 0700 07/30/17 0809  BP: 126/69 128/79 127/72   Pulse: 70 71 72   Resp: 18 (!) 22 (!) 21   Temp:    97.8 F (36.6 C)  TempSrc:    Axillary  SpO2: 100% 98% 98% 99%  Weight: 63.3 kg (139 lb 8.8 oz)     Height:        Intake/Output Summary (Last 24 hours) at 07/30/2017 0834 Last data filed at 07/30/2017 0800 Gross per 24 hour  Intake 540.1 ml  Output 1350 ml  Net -809.9 ml   Filed Weights   07/28/17 0500 07/29/17 0500 07/30/17 0500  Weight: 66 kg (145 lb 8.1 oz) 65.3 kg (143 lb 15.4 oz) 63.3 kg (139 lb 8.8 oz)    Telemetry    Sinus rhythm.  No events  - Personally Reviewed  ECG    07/30/17: Accelerated junctional rhythm.  Rate 73 bpm.  Anterolateral T  wave inversions.  QTc 462 ms.  Personally Reviewed  Physical Exam   VS:  BP 127/72   Pulse 72   Temp 97.8 F (36.6 C) (Axillary)   Resp (!) 21   Ht 5\' 10"  (1.778 m)   Wt 63.3 kg (139 lb 8.8 oz)   SpO2 99%   BMI 20.02 kg/m  , BMI Body mass index is 20.02 kg/m.  CVP 13 GENERAL: Chronically ill-appearing.  Bipap in place.  HEENT: MMM NECK:  Unable to assess JVD.  Carotid upstroke brisk and symmetric, no bruits, no thyromegaly LUNGS: Coarse breath sounds on anterior exam.  HEART:  RRR.  PMI not displaced or sustained,S1 and S2 within normal limits, no S3, no S4, no clicks, no rubs, no murmurs ABD:  Flat, positive bowel sounds normal in frequency in pitch, no bruits, no rebound, no guarding, no midline pulsatile mass, no hepatomegaly, no splenomegaly EXT:  2 plus pulses throughout, no edema, no cyanosis no clubbing SKIN:  No rashes no nodules NEURO:  Lethargic but responds to questions and follows commands. PSYCH: Unable to assess   Labs    Chemistry Recent Labs  Lab 07/25/17 [phone]  07/28/17 0334 07/29/17 0411  07/29/17 0949 07/30/17 0549  NA 136   < > 141 143  --  145  K 4.0   < > 3.5 3.2*  --  3.4*  CL 106   < > 103 103  --  107  CO2 22   < > 29 30  --  30  GLUCOSE 130*   < > 125* 128*  --  117*  BUN 42*   < > 42* 43*  --  35*  CREATININE 1.72*   < > 1.39* 1.33*  --  1.34*  CALCIUM 8.4*   < > 9.3 9.1  --  9.1  PROT 5.4*  --   --  5.9* 5.9*  --   ALBUMIN 2.2*  --   --  2.7* 2.7*  --   AST 63*  --   --  33 32  --   ALT 287*  --   --  137* 128*  --   ALKPHOS 110  --   --  106 91  --   BILITOT 0.7  --   --  0.8 1.2  --   GFRNONAA 40*   < > 51* 54*  --  54*  GFRAA 46*   < > 59* >60  --  >60  ANIONGAP 8   < > 9 10  --  8   < > = values in this interval not displayed.     Hematology Recent Labs  Lab 07/28/17 0334 07/29/17 0411 07/30/17 0549  WBC 9.1 12.7* 13.9*  RBC 3.23* 3.22* 3.30*  HGB 10.0* 10.0* 10.1*  HCT 31.5* 31.8* 33.1*  MCV 97.5 98.8 100.3*  MCH 31.0  31.1 30.6  MCHC 31.7 31.4 30.5  RDW 14.3 14.9 14.7  PLT 316 343 329    Cardiac Enzymes Recent Labs  Lab 07/13/2017 2034  TROPONINI 4.04*   No results for input(s): TROPIPOC in the last 168 hours.   BNP Recent Labs  Lab 08/03/2017 2034 07/24/17 0432 07/29/17 0930  BNP 4,018.6* 3,789.9* 3,357.3*     DDimer  No results for input(s): DDIMER in the last 168 hours.   Radiology    Dg Chest Port 1 View  Result Date: 07/30/2017 CLINICAL DATA:  Respiratory failure. EXAM: PORTABLE CHEST 1 VIEW COMPARISON:  Chest x-ray from yesterday. FINDINGS: Unchanged positioning of the right internal jugular central venous catheter. The enteric tube has been retracted, with the tip now in the distal esophagus. The patient is rotated to the right on today's study. Stable cardiomediastinal silhouette. Normal pulmonary vascularity. Stable small right pleural effusion with adjacent patchy opacities. The left lung is clear. No pneumothorax. No acute osseous abnormality. IMPRESSION: 1. Interval retraction of the enteric tube, with the tip now in the distal esophagus. Recommend advancement. 2. Stable small right pleural effusion with adjacent patchy opacities, atelectasis versus infiltrate. Electronically Signed   By: Titus Dubin M.D.   On: 07/30/2017 07:38   Dg Chest Port 1 View  Result Date: 07/29/2017 CLINICAL DATA:  Respiratory failure and bradycardia EXAM: PORTABLE CHEST 1 VIEW COMPARISON:  July 28, 2017 FINDINGS: Nasogastric tube tip and side port are below the diaphragm. Central catheter tip is in the superior vena cava. No pneumothorax. There is persistent consolidation in the right base region with right pleural effusion. Lungs elsewhere clear. Heart is mildly enlarged with pulmonary vascularity within normal limits. No adenopathy. There is aortic atherosclerosis. No appreciable bone lesions. IMPRESSION: Tube and catheter positions as described without pneumothorax. Persistent right base  consolidation  with right pleural effusion. No new opacity. Stable cardiac prominence. There is aortic atherosclerosis. Aortic Atherosclerosis (ICD10-I70.0). Electronically Signed   By: Lowella Grip III M.D.   On: 07/29/2017 09:10    Cardiac Studies   Echo 07/19/2017   - Left ventricle: The cavity size was normal. There was moderate concentric hypertrophy. Systolic function was normal. The estimated ejection fraction was in the range of 60% to 65%. Wall motion was normal; there were no regional wall motion abnormalities. - Mitral valve: Calcified annulus. - Right ventricle: Systolic function was moderately reduced. - Tricuspid valve: There was mild regurgitation. - Pericardium, extracardiac: The pericardium appears thickened at the apex as well as the apical RV wall with small pericardial effusion over the apex. Suggest cardiac MRI to evaluate pericardium further.   Cath 07/08/2017  Ost 1st Mrg lesion is 50% stenosed.  Ost Cx to Mid Cx lesion is 25% stenosed.  Prox LAD to Mid LAD lesion is 25% stenosed.  Ost RCA to Prox RCA lesion is 90% stenosed.  Mid RCA lesion is 99% stenosed.  Ost RPDA to RPDA lesion is 90% stenosed.  Previously placed Dist RCA stent (unknown type) is widely patent.  LV end diastolic pressure is normal.  There is no aortic valve stenosis.  Subtotally occluded RCA with thrombus, left to right collaterals. Continue medical therapy.   Coronary Diagrams   Diagnostic Diagram           Patient Profile     66 y.o. male with CAD s/p RCA PCI in 2013, PAD (AAA, aortobifem bypass in 2016), hypertension and bradycardia admitted 11/11 with syncope.  Underwent LHC and found to have subtotal occlusion with heavy thrombus burden in the right coronary artery (with formed left to right collaterals), deemed best for medical therapy.He also had hypoxic respiratory failure, extubated 11/18.  Assessment & Plan    # Somnolence:  Patient now unable to wake up or open his eyes.  He received lorazepam at 10pm last night.  Will check ABG.   # NSTEMI:  Troponin peaked at 29.  Left heart catheterization revealed 50% OM1, 25% left circumflex, 25% proximal LAD, 90% ostial to proximal RCA, 99% mid RCA, 90% R PDA.  There were also left to right collaterals.  Medical management was recommended.  There are no plans for inpatient lung biopsy.  This will occur as an outpatient if he stabilizes from his acute illness.  Continue clopidogrel 75 mg daily.  He has a history of alcohol abuse and LFTs remain elevated. No statin at this time.  Continue carvedilol.  # Syncope: No recurrent events since admission.   # Presumed diastolic heart failure:  CVP 13.  BNP was over 3000.  Resume Lasix 40 mg twice daily.  Echo noted thickened pericardium.  He will need a cardiac MRI if he becomes more stable and renal function permits.  .    # PAF: Remains in sinus/junctional rhythm on amiodarone and carvedilol. As above there is no plans for lung biopsy this admission.  Eliquis started this admission.     # Hypertension:  Blood pressure much better after increasing carvedilol.  # R UL/LL mass: Suspicious for lung cancer.   He will have an outpatient biopsy once stable.  # Acute on chronic renal failure: His creatinine is now back to baseline and continues to improve daily.    Patient's family requested that BiPAP be removed.  However apparently they then changed their minds as he currently remains on BiPAP.  He  seems to be more responsive today.  Cardiovascular symptoms seem to stable at this time.  We will follow peripherally over the weekend.  Please call if there are questions.  Time spent: 30 minutes-Greater than 50% of this time was spent in counseling, explanation of diagnosis, planning of further management, and coordination of care.    For questions or updates, please contact [facility] Please consult [email or web address] for contact info  under Cardiology/STEMI.      Signed, Skeet Latch, MD  07/30/2017, 8:34 AM

## 2017-07-31 ENCOUNTER — Inpatient Hospital Stay (HOSPITAL_COMMUNITY): Payer: Medicare HMO

## 2017-07-31 DIAGNOSIS — Z01818 Encounter for other preprocedural examination: Secondary | ICD-10-CM

## 2017-07-31 DIAGNOSIS — I2510 Atherosclerotic heart disease of native coronary artery without angina pectoris: Secondary | ICD-10-CM

## 2017-07-31 LAB — BASIC METABOLIC PANEL
ANION GAP: 8 (ref 5–15)
BUN: 40 mg/dL — AB (ref 6–20)
CHLORIDE: 108 mmol/L (ref 101–111)
CO2: 28 mmol/L (ref 22–32)
Calcium: 8.8 mg/dL — ABNORMAL LOW (ref 8.9–10.3)
Creatinine, Ser: 1.53 mg/dL — ABNORMAL HIGH (ref 0.61–1.24)
GFR, EST AFRICAN AMERICAN: 53 mL/min — AB (ref >60)
GFR, EST NON AFRICAN AMERICAN: 46 mL/min — AB (ref >60)
Glucose, Bld: 122 mg/dL — ABNORMAL HIGH (ref 65–99)
POTASSIUM: 3 mmol/L — AB (ref 3.5–5.1)
SODIUM: 144 mmol/L (ref 135–145)

## 2017-07-31 LAB — GLUCOSE, CAPILLARY
GLUCOSE-CAPILLARY: 110 mg/dL — AB (ref 65–99)
GLUCOSE-CAPILLARY: 122 mg/dL — AB (ref 65–99)
Glucose-Capillary: 122 mg/dL — ABNORMAL HIGH (ref 65–99)
Glucose-Capillary: 99 mg/dL (ref 65–99)
Glucose-Capillary: 141 mg/dL — ABNORMAL HIGH (ref 65–99)

## 2017-07-31 LAB — CBC WITH DIFFERENTIAL/PLATELET
BASOS ABS: 0 10*3/uL (ref 0.0–0.1)
Basophils Relative: 0 %
EOS ABS: 0.1 10*3/uL (ref 0.0–0.7)
Eosinophils Relative: 1 %
HCT: 32 % — ABNORMAL LOW (ref 39.0–52.0)
HEMOGLOBIN: 10 g/dL — AB (ref 13.0–17.0)
LYMPHS ABS: 0.8 10*3/uL (ref 0.7–4.0)
Lymphocytes Relative: 6 %
MCH: 31 pg (ref 26.0–34.0)
MCHC: 31.3 g/dL (ref 30.0–36.0)
MCV: 99.1 fL (ref 78.0–100.0)
Monocytes Absolute: 0.6 10*3/uL (ref 0.1–1.0)
Monocytes Relative: 5 %
NEUTROS PCT: 88 %
Neutro Abs: 11.8 10*3/uL — ABNORMAL HIGH (ref 1.7–7.7)
Platelets: 305 10*3/uL (ref 150–400)
RBC: 3.23 MIL/uL — AB (ref 4.22–5.81)
RDW: 14.3 % (ref 11.5–15.5)
WBC: 13.3 10*3/uL — AB (ref 4.0–10.5)

## 2017-07-31 LAB — TROPONIN I
Troponin I: 1.02 ng/mL
Troponin I: 1.1 ng/mL (ref <0.03)

## 2017-07-31 LAB — PHOSPHORUS: PHOSPHORUS: 3.3 mg/dL (ref 2.5–4.6)

## 2017-07-31 LAB — MAGNESIUM: MAGNESIUM: 1.6 mg/dL — AB (ref 1.7–2.4)

## 2017-07-31 LAB — CORTISOL: Cortisol, Plasma: 15.7 ug/dL

## 2017-07-31 MED ORDER — SODIUM CHLORIDE 0.9% FLUSH
10.0000 mL | INTRAVENOUS | Status: DC | PRN
  Administered 2017-07-31: 10 mL
  Filled 2017-07-31: qty 40

## 2017-07-31 MED ORDER — POTASSIUM CHLORIDE 10 MEQ/50ML IV SOLN
10.0000 meq | INTRAVENOUS | Status: AC
  Administered 2017-07-31 (×4): 10 meq via INTRAVENOUS
  Filled 2017-07-31 (×4): qty 50

## 2017-07-31 MED ORDER — BISACODYL 10 MG RE SUPP
10.0000 mg | Freq: Once | RECTAL | Status: AC
  Administered 2017-07-31: 10 mg via RECTAL
  Filled 2017-07-31: qty 1

## 2017-07-31 MED ORDER — SODIUM CHLORIDE 0.9% FLUSH
10.0000 mL | Freq: Two times a day (BID) | INTRAVENOUS | Status: DC
  Administered 2017-07-31: 10 mL
  Administered 2017-07-31: 30 mL
  Administered 2017-08-01: 10 mL

## 2017-07-31 NOTE — Progress Notes (Signed)
Progress Note  Patient Name: Adam Bernstein Sr. Date of Encounter: 07/31/2017  Primary Cardiologist: Dr. Claiborne Billings  Subjective   Denies pain or discomfort.   Inpatient Medications    Scheduled Meds: . amiodarone  400 mg Oral BID  . amLODipine  10 mg Oral Daily  . apixaban  5 mg Oral BID  . carvedilol  18.75 mg Oral BID WC  . clonazePAM  1 mg Per Tube BID  . clopidogrel  75 mg Oral Daily  . folic acid  1 mg Oral Daily  . furosemide  40 mg Intravenous BID  . mouth rinse  15 mL Mouth Rinse q12n4p  . pantoprazole sodium  40 mg Per Tube QHS  . potassium chloride  40 mEq Oral Daily  . QUEtiapine  50 mg Oral BID  . sodium chloride flush  10-40 mL Intracatheter Q12H  . sodium chloride flush  10-40 mL Intracatheter Q12H  . tamsulosin  0.4 mg Oral Daily  . [START ON 08/01/2017] thiamine  100 mg Oral Daily   Continuous Infusions: . sodium chloride Stopped (07/21/17 1607)  . sodium chloride    . feeding supplement (VITAL AF 1.2 CAL) 1,000 mL (07/30/17 1952)  . thiamine injection Stopped (07/31/17 0954)   PRN Meds: sodium chloride, [CANCELED] Place/Maintain arterial line **AND** sodium chloride, atropine, hydrALAZINE, ipratropium-albuterol, LORazepam, sodium chloride flush, sodium chloride flush   Vital Signs    Vitals:   07/31/17 0447 07/31/17 0449 07/31/17 0455 07/31/17 0954  BP: (!) 100/48   (!) 97/50  Pulse: 65     Resp: 16     Temp: (!) 100.4 F (38 C)  (!) 100.8 F (38.2 C)   TempSrc: Axillary  Rectal   SpO2: 98%     Weight:  142 lb (64.4 kg)    Height:        Intake/Output Summary (Last 24 hours) at 07/31/2017 1105 Last data filed at 07/31/2017 0924 Gross per 24 hour  Intake 608.75 ml  Output 3600 ml  Net -2991.25 ml   Filed Weights   07/29/17 0500 07/30/17 0500 07/31/17 0449  Weight: 143 lb 15.4 oz (65.3 kg) 139 lb 8.8 oz (63.3 kg) 142 lb (64.4 kg)    Telemetry    Sinus rhythm.  No events  - Personally Reviewed  ECG    07/30/17: Accelerated  junctional rhythm.  Rate 73 bpm.  Anterolateral T wave inversions.  QTc 462 ms.  Personally Reviewed  Physical Exam   VS:  BP (!) 97/50   Pulse 65   Temp (!) 100.8 F (38.2 C) (Rectal)   Resp 16   Ht 5\' 10"  (1.778 m)   Wt 142 lb (64.4 kg)   SpO2 98%   BMI 20.37 kg/m  , BMI Body mass index is 20.37 kg/m.  CVP 13 GENERAL: Chronically ill-appearing.  NG tube  HEENT: MMM NECK:  No bruits  LUNGS: Coarse breath sounds on anterior exam.  HEART:  RRR. No murmurs  ABD:  Non acute BS minimal  EXT:  2 plus pulses throughout, no edema, no cyanosis no clubbing SKIN:  No rashes no nodules NEURO:  Lethargic but responds to questions and follows commands. PSYCH: somnolent    Labs    Chemistry Recent Labs  Lab 07/25/17 0414  07/29/17 0411 07/29/17 0949 07/30/17 0549 07/31/17 0230  NA 136   < > 143  --  145 144  K 4.0   < > 3.2*  --  3.4* 3.0*  CL 106   < >  103  --  107 108  CO2 22   < > 30  --  30 28  GLUCOSE 130*   < > 128*  --  117* 122*  BUN 42*   < > 43*  --  35* 40*  CREATININE 1.72*   < > 1.33*  --  1.34* 1.53*  CALCIUM 8.4*   < > 9.1  --  9.1 8.8*  PROT 5.4*  --  5.9* 5.9*  --   --   ALBUMIN 2.2*  --  2.7* 2.7*  --   --   AST 63*  --  33 32  --   --   ALT 287*  --  137* 128*  --   --   ALKPHOS 110  --  106 91  --   --   BILITOT 0.7  --  0.8 1.2  --   --   GFRNONAA 40*   < > 54*  --  54* 46*  GFRAA 46*   < > >60  --  >60 53*  ANIONGAP 8   < > 10  --  8 8   < > = values in this interval not displayed.     Hematology Recent Labs  Lab 07/29/17 0411 07/30/17 0549 07/31/17 0230  WBC 12.7* 13.9* 13.3*  RBC 3.22* 3.30* 3.23*  HGB 10.0* 10.1* 10.0*  HCT 31.8* 33.1* 32.0*  MCV 98.8 100.3* 99.1  MCH 31.1 30.6 31.0  MCHC 31.4 30.5 31.3  RDW 14.9 14.7 14.3  PLT 343 329 305    Cardiac Enzymes Recent Labs  Lab 07/31/17 0230 07/31/17 0900  TROPONINI 1.02* 1.10*   No results for input(s): TROPIPOC in the last 168 hours.   BNP Recent Labs  Lab 07/29/17 0930    BNP 3,357.3*     DDimer  No results for input(s): DDIMER in the last 168 hours.   Radiology    Dg Chest Port 1 View  Result Date: 07/31/2017 CLINICAL DATA:  Respiratory failure. EXAM: PORTABLE CHEST 1 VIEW COMPARISON:  07/30/2017 FINDINGS: Right jugular catheter terminates over the SVC. Enteric tube has been advanced in the interim, however the tip projects in the region of the GE junction. The cardiomediastinal silhouette is unchanged allowing for rightward patient rotation. There is increasing dense opacity in the right lung base likely reflecting an enlarging pleural effusion and atelectasis/consolidation. There is underlying right lung volume loss which is increased. The left lung is clear. IMPRESSION: 1. Enlarging right pleural effusion with increasing right basilar atelectasis/consolidation. 2. Slight interval advancement of enteric tube, with tip now near the GE junction. Electronically Signed   By: Logan Bores M.D.   On: 07/31/2017 09:41   Dg Chest Port 1 View  Result Date: 07/30/2017 CLINICAL DATA:  Respiratory failure. EXAM: PORTABLE CHEST 1 VIEW COMPARISON:  Chest x-ray from yesterday. FINDINGS: Unchanged positioning of the right internal jugular central venous catheter. The enteric tube has been retracted, with the tip now in the distal esophagus. The patient is rotated to the right on today's study. Stable cardiomediastinal silhouette. Normal pulmonary vascularity. Stable small right pleural effusion with adjacent patchy opacities. The left lung is clear. No pneumothorax. No acute osseous abnormality. IMPRESSION: 1. Interval retraction of the enteric tube, with the tip now in the distal esophagus. Recommend advancement. 2. Stable small right pleural effusion with adjacent patchy opacities, atelectasis versus infiltrate. Electronically Signed   By: Titus Dubin M.D.   On: 07/30/2017 07:38    Cardiac Studies  Echo 07/19/2017   - Left ventricle: The cavity size was normal.  There was moderate concentric hypertrophy. Systolic function was normal. The estimated ejection fraction was in the range of 60% to 65%. Wall motion was normal; there were no regional wall motion abnormalities. - Mitral valve: Calcified annulus. - Right ventricle: Systolic function was moderately reduced. - Tricuspid valve: There was mild regurgitation. - Pericardium, extracardiac: The pericardium appears thickened at the apex as well as the apical RV wall with small pericardial effusion over the apex. Suggest cardiac MRI to evaluate pericardium further.   Cath 07/10/2017  Ost 1st Mrg lesion is 50% stenosed.  Ost Cx to Mid Cx lesion is 25% stenosed.  Prox LAD to Mid LAD lesion is 25% stenosed.  Ost RCA to Prox RCA lesion is 90% stenosed.  Mid RCA lesion is 99% stenosed.  Ost RPDA to RPDA lesion is 90% stenosed.  Previously placed Dist RCA stent (unknown type) is widely patent.  LV end diastolic pressure is normal.  There is no aortic valve stenosis.  Subtotally occluded RCA with thrombus, left to right collaterals. Continue medical therapy.   Coronary Diagrams   Diagnostic Diagram           Patient Profile     66 y.o. male with CAD s/p RCA PCI in 2013, PAD (AAA, aortobifem bypass in 2016), hypertension and bradycardia admitted 11/11 with syncope.  Underwent LHC and found to have subtotal occlusion with heavy thrombus burden in the right coronary artery (with formed left to right collaterals), deemed best for medical therapy.He also had hypoxic respiratory failure, extubated 11/18.  Assessment & Plan    # NSTEMI:  Troponin peaked at 29.  Left heart catheterization revealed 50% OM1, 25% left circumflex, 25% proximal LAD, 90% ostial to proximal RCA, 99% mid RCA, 90% R PDA.  There were also left to right collaterals.  Medical management was recommended.  There are no plans for inpatient lung biopsy.  This will occur as an outpatient if he  stabilizes from his acute illness.  Continue clopidogrel 75 mg daily.  He has a history of alcohol abuse and LFTs remain elevated. No statin at this time.  Continue carvedilol. Will likely need to hold plavix for 5 days or check  P2y before any biopsy   # Syncope: No recurrent events since admission.   # Presumed diastolic heart failure:  CVP 13.  BNP was over 3000.  Resume Lasix 40 mg twice daily.  Echo noted thickened pericardium. Not a candidate for MRI at this time   # PAF: Remains in sinus/junctional rhythm on amiodarone and carvedilol. As above there is no plans for lung biopsy this admission.  Eliquis started this admission.     # Hypertension:  Well controlled.  Continue current medications and low sodium Dash type diet.     # R UL/LL mass: Suspicious for lung cancer.   He will have an outpatient biopsy once stable.  # Acute on chronic renal failure: improved  Lab Results  Component Value Date   CREATININE 1.53 (H) 07/31/2017   BUN 40 (H) 07/31/2017   NA 144 07/31/2017   K 3.0 (L) 07/31/2017   CL 108 07/31/2017   CO2 28 07/31/2017         Signed, Jenkins Rouge, MD  07/31/2017, 11:05 AM

## 2017-07-31 NOTE — Progress Notes (Signed)
Pt blood pressure 87/50 then recheck shows 97/45. Lasix and carvedilol held. MD paged with this information

## 2017-07-31 NOTE — Progress Notes (Signed)
CRITICAL VALUE ALERT  Critical Value: Trop 1.02  Date & Time Notied: 07/31/17 @ 2025  Provider Notified: Georges Mouse, MD  Orders Received/Actions taken: No new orders obtained. Will ctm.

## 2017-07-31 NOTE — Progress Notes (Signed)
PROGRESS NOTE    Adam Luppino Sr.  UTM:[id number] DOB: 04-23-1951 DOA: 07/20/2017 PCP: Adin Hector, MD    Brief Narrative:   66 y.o. male with history of coronary artery disease status post stent to right coronary artery in 2013 as well as abdominal aortic aneurysm status post open repair in 2016 with aortobifemoral bypass graft. Patient has a known history of hyperlipidemia as well as alcohol use. He quit smoking in 2013. Presented to Hospital on 11/11 after being found down for unknown time. Upon EMS arrival patient was complaining of central chest discomfort.    Assessment & Plan:   Active Problems:   Chest pain/NSTEMI - Cardiology on board and recommended Medical management - Plan is to continue clopidogrel    Shock ([place]) - resolved  Fever - completed 7 days of antibiotics - if spikes another fever may cover with antibiotics again    Bradycardia   Externally paced cardiac rhytm   Hypotension   Acute respiratory distress    Cardiogenic shock ([place]) - resolved, cardiology on board.  RUL mass - suspicious for lung cancer. Pt to have outpatient biopsy once stable    Acute encephalopathy - remains with limited interaction with examiner. Will consider palliative consult for discharge planning    Shock liver   Cardiac arrest ([place])   Alcohol withdrawal syndrome, with delirium ([facility])   Paroxysmal atrial fibrillation ([facility]) -  On amiodarone and carvedilol - eliquis started this admission    Anticoagulation adequate   Anticoagulant long-term use   DVT prophylaxis: Eliquis Code Status: DNR Family Communication: None at bedside. Disposition Plan: Pending improvement in condition and or d/c planning   Consultants:   Cardiology   Procedures: Pt was intubated   Antimicrobials:    Subjective: Pt is non verbal with examiner  Objective: Vitals:   07/31/17 0954 07/31/17 1300 07/31/17 1720 07/31/17 1725  BP: (!) 97/50 (!) 99/50 (!) 87/50 (!) 97/45    Pulse:  61    Resp:  16    Temp:  97.8 F (36.6 C)    TempSrc:  Axillary    SpO2:  99%    Weight:      Height:        Intake/Output Summary (Last 24 hours) at 07/31/2017 1800 Last data filed at 07/31/2017 1300 Gross per 24 hour  Intake 608.75 ml  Output 2150 ml  Net -1541.25 ml   Filed Weights   07/29/17 0500 07/30/17 0500 07/31/17 0449  Weight: 65.3 kg (143 lb 15.4 oz) 63.3 kg (139 lb 8.8 oz) 64.4 kg (142 lb)    Examination:  General exam: Appears calm and comfortable , in nad Respiratory system: Clear to auscultation. Respiratory effort normal. Cardiovascular system: S1 & S2 heard. No JVD, murmurs, rubs, gallops or clicks.  Gastrointestinal system: Abdomen is nondistended, soft and nontender.  Central nervous system: Alert and oriented. No facial asymmetry Extremities: warm, + pulses Skin: No rashes, lesions or ulcers, on limited exam. Psychiatry: unable to assess as patient doe snot interact with examiner verbally    Data Reviewed: I have personally reviewed following labs and imaging studies  CBC: Recent Labs  Lab 07/27/17 0350 07/28/17 0334 07/29/17 0411 07/30/17 0549 07/31/17 0230  WBC 7.7 9.1 12.7* 13.9* 13.3*  NEUTROABS  --   --  11.6* 12.5* 11.8*  HGB 9.5* 10.0* 10.0* 10.1* 10.0*  HCT 29.9* 31.5* 31.8* 33.1* 32.0*  MCV 97.4 97.5 98.8 100.3* 99.1  PLT 298 316 343 329 305  Basic Metabolic Panel: Recent Labs  Lab 07/27/17 0350 07/28/17 0334 07/29/17 0411 07/30/17 0549 07/31/17 0230  NA 139 141 143 145 144  K 4.0 3.5 3.2* 3.4* 3.0*  CL 105 103 103 107 108  CO2 _0 GLUCOSE 115* 125* 128* 117* 122*  BUN 44* 42* 43* 35* 40*  CREATININE 1.48* 1.39* 1.33* 1.34* 1.53*  CALCIUM 9.1 9.3 9.1 9.1 8.8*  MG 1.9  --  1.8 1.8 1.6*  PHOS 3.7  --  4.3 3.3 3.3   GFR: Estimated Creatinine Clearance: 43.3 mL/min (A) (by C-G formula based on SCr of 1.53 mg/dL (H)). Liver Function Tests: Recent Labs  Lab 07/25/17 0414 07/29/17 0411  07/29/17 0949  AST 63* 33 32  ALT 287* 137* 128*  ALKPHOS 110 106 91  BILITOT 0.7 0.8 1.2  PROT 5.4* 5.9* 5.9*  ALBUMIN 2.2* 2.7* 2.7*   No results for input(s): LIPASE, AMYLASE in the last 168 hours. Recent Labs  Lab 07/29/17 1852  AMMONIA 13   Coagulation Profile: No results for input(s): INR, PROTIME in the last 168 hours. Cardiac Enzymes: Recent Labs  Lab 07/31/17 0230 07/31/17 0900  TROPONINI 1.02* 1.10*   BNP (last 3 results) No results for input(s): PROBNP in the last 8760 hours. HbA1C: No results for input(s): HGBA1C in the last 72 hours. CBG: Recent Labs  Lab 07/30/17 2317 07/31/17 0444 07/31/17 0753 07/31/17 1205 07/31/17 1634  GLUCAP 92 122* 99 110* 141*   Lipid Profile: No results for input(s): CHOL, HDL, LDLCALC, TRIG, CHOLHDL, LDLDIRECT in the last 72 hours. Thyroid Function Tests: Recent Labs    07/29/17 1852  TSH 1.506  FREET4 1.68*   Anemia Panel: Recent Labs    07/29/17 1852  VITAMINB12 1,199*   Sepsis Labs: No results for input(s): PROCALCITON, LATICACIDVEN in the last 168 hours.  Recent Results (from the past 240 hour(s))  Culture, bal-quantitative     Status: Abnormal   Collection Time: 07/22/17  9:58 AM  Result Value Ref Range Status   Specimen Description BRONCHIAL ALVEOLAR LAVAGE  Final   Special Requests RUL  Final   Gram Stain   Final    ABUNDANT WBC PRESENT, PREDOMINANTLY PMN NO ORGANISMS SEEN    Culture 100 COLONIES/mL STENOTROPHOMONAS MALTOPHILIA (A)  Final   Report Status 07/26/2017 FINAL  Final   Organism ID, Bacteria STENOTROPHOMONAS MALTOPHILIA (A)  Final      Susceptibility   Stenotrophomonas maltophilia - MIC*    LEVOFLOXACIN 1 SENSITIVE Sensitive     TRIMETH/SULFA <=20 SENSITIVE Sensitive     * 100 COLONIES/mL STENOTROPHOMONAS MALTOPHILIA  Culture, bal-quantitative     Status: Abnormal   Collection Time: 07/22/17 10:02 AM  Result Value Ref Range Status   Specimen Description BRONCHIAL ALVEOLAR LAVAGE   Final   Special Requests RLL  Final   Gram Stain   Final    ABUNDANT WBC PRESENT, PREDOMINANTLY PMN RARE SQUAMOUS EPITHELIAL CELLS PRESENT NO ORGANISMS SEEN    Culture (A)  Final    100 COLONIES/mL FUNGUS (MOLD) ISOLATED, PROBABLE CONTAMINANT/COLONIZER (SAPROPHYTE). CONTACT MICROBIOLOGY IF FURTHER IDENTIFICATION REQUIRED [phone]. 300 COLONIES/mL STENOTROPHOMONAS MALTOPHILIA SUSCEPTIBILITIES PERFORMED ON PREVIOUS CULTURE WITHIN THE LAST 5 DAYS.    Report Status 07/26/2017 FINAL  Final         Radiology Studies: Dg Chest Port 1 View  Result Date: 07/31/2017 CLINICAL DATA:  Respiratory failure. EXAM: PORTABLE CHEST 1 VIEW COMPARISON:  07/30/2017 FINDINGS: Right jugular catheter terminates over the SVC. Enteric tube  has been advanced in the interim, however the tip projects in the region of the GE junction. The cardiomediastinal silhouette is unchanged allowing for rightward patient rotation. There is increasing dense opacity in the right lung base likely reflecting an enlarging pleural effusion and atelectasis/consolidation. There is underlying right lung volume loss which is increased. The left lung is clear. IMPRESSION: 1. Enlarging right pleural effusion with increasing right basilar atelectasis/consolidation. 2. Slight interval advancement of enteric tube, with tip now near the GE junction. Electronically Signed   By: Logan Bores M.D.   On: 07/31/2017 09:41   Dg Chest Port 1 View  Result Date: 07/30/2017 CLINICAL DATA:  Respiratory failure. EXAM: PORTABLE CHEST 1 VIEW COMPARISON:  Chest x-ray from yesterday. FINDINGS: Unchanged positioning of the right internal jugular central venous catheter. The enteric tube has been retracted, with the tip now in the distal esophagus. The patient is rotated to the right on today's study. Stable cardiomediastinal silhouette. Normal pulmonary vascularity. Stable small right pleural effusion with adjacent patchy opacities. The left lung is clear.  No pneumothorax. No acute osseous abnormality. IMPRESSION: 1. Interval retraction of the enteric tube, with the tip now in the distal esophagus. Recommend advancement. 2. Stable small right pleural effusion with adjacent patchy opacities, atelectasis versus infiltrate. Electronically Signed   By: Titus Dubin M.D.   On: 07/30/2017 07:38        Scheduled Meds: . amiodarone  400 mg Oral BID  . amLODipine  10 mg Oral Daily  . apixaban  5 mg Oral BID  . carvedilol  18.75 mg Oral BID WC  . clonazePAM  1 mg Per Tube BID  . clopidogrel  75 mg Oral Daily  . folic acid  1 mg Oral Daily  . furosemide  40 mg Intravenous BID  . mouth rinse  15 mL Mouth Rinse q12n4p  . pantoprazole sodium  40 mg Per Tube QHS  . potassium chloride  40 mEq Oral Daily  . QUEtiapine  50 mg Oral BID  . sodium chloride flush  10-40 mL Intracatheter Q12H  . sodium chloride flush  10-40 mL Intracatheter Q12H  . tamsulosin  0.4 mg Oral Daily  . [START ON 08/01/2017] thiamine  100 mg Oral Daily   Continuous Infusions: . sodium chloride Stopped (07/21/17 1607)  . sodium chloride    . feeding supplement (VITAL AF 1.2 CAL) 1,000 mL (07/30/17 1952)  . thiamine injection 0 mg (07/31/17 0954)     LOS: 13 days    Time spent: > 35 min    Velvet Bathe, MD [facility] Pager [phone]  If 7PM-7AM, please contact night-coverage [email or web address] Password [email or web address] 07/31/2017, 6:00 PM

## 2017-07-31 NOTE — Plan of Care (Signed)
  Progressing Clinical Measurements: Respiratory complications will improve 07/31/2017 1047 - Progressing by Marice Potter, RN Coping: Level of anxiety will decrease 07/31/2017 1047 - Progressing by Marice Potter, RN   Not Progressing Activity: Risk for activity intolerance will decrease 07/31/2017 1047 - Not Progressing by Marice Potter, RN

## 2017-07-31 NOTE — Progress Notes (Signed)
Looks like patient has not BM since admission. MD paged and requested order for med to facilitate BM

## 2017-08-01 ENCOUNTER — Inpatient Hospital Stay (HOSPITAL_COMMUNITY): Payer: Medicare HMO

## 2017-08-01 ENCOUNTER — Other Ambulatory Visit: Payer: Self-pay

## 2017-08-01 DIAGNOSIS — J989 Respiratory disorder, unspecified: Secondary | ICD-10-CM

## 2017-08-01 LAB — GLUCOSE, CAPILLARY
GLUCOSE-CAPILLARY: 120 mg/dL — AB (ref 65–99)
GLUCOSE-CAPILLARY: 115 mg/dL — AB (ref 65–99)
GLUCOSE-CAPILLARY: 143 mg/dL — AB (ref 65–99)
GLUCOSE-CAPILLARY: 124 mg/dL — AB (ref 65–99)
Glucose-Capillary: 124 mg/dL — ABNORMAL HIGH (ref 65–99)
Glucose-Capillary: 126 mg/dL — ABNORMAL HIGH (ref 65–99)

## 2017-08-01 NOTE — Progress Notes (Signed)
Pt's Resp being in the mid 20's. No distress, very sleepy, pt's being mostly asleep all night. O2-96% on 4l, HR 66, BP 122/50,T100.3. MD notified. Will continue to monitor.

## 2017-08-01 NOTE — Progress Notes (Signed)
PROGRESS NOTE    Adam Lopez Sr.  [name]:[id number] DOB: [birth year] DOA: 08/03/2017 PCP: Adin Hector, MD    Brief Narrative:   66 y.o. male with history of coronary artery disease status post stent to right coronary artery in 2013 as well as abdominal aortic aneurysm status post open repair in 2016 with aortobifemoral bypass graft. Patient has a known history of hyperlipidemia as well as alcohol use. He quit smoking in 2013. Presented to Hospital on 11/11 after being found down for unknown time. Upon EMS arrival patient was complaining of central chest discomfort.    Assessment & Plan:   Active Problems:   Chest pain/NSTEMI - Cardiology on board and recommended Medical management - Plan is to continue clopidogrel    Shock ([place]) - resolved  Metabolic encephalopathy - in context of patient who had cardiogenic shock and shock liver - I suspect that patient may have had some anoxic brain injury. Has not had any improvement in condition. Spoke with twin brother who states that patient would not have liked to have peg tube or ventilator in any situation for a prolonged period of time. I suspect patient will require palliative care consult so that we can sit down with the family and discuss goals of care. It is my opinion that patient should be made comfortable and should transition to residential hospice care  Fever - completed 7 days of antibiotics - if spikes another fever may cover with antibiotics again - No other fevers reported    Bradycardia   Externally paced cardiac rhytm   Hypotension   Acute respiratory distress    Cardiogenic shock ([place]) - resolved, cardiology on board.  RUL mass - suspicious for lung cancer. Pt to have outpatient biopsy once stable    Acute encephalopathy - remains with limited interaction with examiner. Will consider palliative consult for discharge planning    Shock liver   Cardiac arrest ([place])   Alcohol withdrawal syndrome, with  delirium (HCC)   Paroxysmal atrial fibrillation (HCC) -  On amiodarone and carvedilol - eliquis started this admission    Anticoagulation adequate   Anticoagulant long-term use   DVT prophylaxis: Eliquis Code Status: DNR Family Communication: Discussed with twin brother Disposition Plan: I recommend residential hospice and most likely consult palliative care next a.m. for goals are care clarification   Consultants:   Cardiology   Procedures: Pt was intubated   Antimicrobials:    Subjective: Pt is non verbal with examiner  Objective: Vitals:   08/01/17 0100 08/01/17 0439 08/01/17 0544 08/01/17 0859  BP:   (!) 103/50 (!) 100/51  Pulse: 66  65 62  Resp: (!) 24  (!) 30   Temp:   99.6 F (37.6 C)   TempSrc:   Oral   SpO2: 96%  96%   Weight:  63.9 kg (140 lb 14.4 oz)    Height:        Intake/Output Summary (Last 24 hours) at 08/01/2017 1423 Last data filed at 08/01/2017 0548 Gross per 24 hour  Intake -  Output 900 ml  Net -900 ml   Filed Weights   07/30/17 0500 07/31/17 0449 08/01/17 0439  Weight: 63.3 kg (139 lb 8.8 oz) 64.4 kg (142 lb) 63.9 kg (140 lb 14.4 oz)    Examination: Exam unchanged when compared 07/31/2017  General exam: Appears calm and comfortable , in nad Respiratory system: Clear to auscultation. Respiratory effort normal. Cardiovascular system: S1 & S2 heard. No JVD, murmurs, rubs, gallops or  clicks.  Gastrointestinal system: Abdomen is nondistended, soft and nontender.  Central nervous system: Alert and oriented. No facial asymmetry Extremities: warm, + pulses Skin: No rashes, lesions or ulcers, on limited exam. Psychiatry: unable to assess as patient doe snot interact with examiner verbally    Data Reviewed: I have personally reviewed following labs and imaging studies  CBC: Recent Labs  Lab 07/27/17 0350 07/28/17 0334 07/29/17 0411 07/30/17 0549 07/31/17 0230  WBC 7.7 9.1 12.7* 13.9* 13.3*  NEUTROABS  --   --  11.6* 12.5*  11.8*  HGB 9.5* 10.0* 10.0* 10.1* 10.0*  HCT 29.9* 31.5* 31.8* 33.1* 32.0*  MCV 97.4 97.5 98.8 100.3* 99.1  PLT 298 316 343 329 433   Basic Metabolic Panel: Recent Labs  Lab 07/27/17 0350 07/28/17 0334 07/29/17 0411 07/30/17 0549 07/31/17 0230  NA 139 141 143 145 144  K 4.0 3.5 3.2* 3.4* 3.0*  CL 105 103 103 107 108  CO2 26 29 30 30 28   GLUCOSE 115* 125* 128* 117* 122*  BUN 44* 42* 43* 35* 40*  CREATININE 1.48* 1.39* 1.33* 1.34* 1.53*  CALCIUM 9.1 9.3 9.1 9.1 8.8*  MG 1.9  --  1.8 1.8 1.6*  PHOS 3.7  --  4.3 3.3 3.3   GFR: Estimated Creatinine Clearance: 42.9 mL/min (A) (by C-G formula based on SCr of 1.53 mg/dL (H)). Liver Function Tests: Recent Labs  Lab 07/29/17 0411 07/29/17 0949  AST 33 32  ALT 137* 128*  ALKPHOS 106 91  BILITOT 0.8 1.2  PROT 5.9* 5.9*  ALBUMIN 2.7* 2.7*   No results for input(s): LIPASE, AMYLASE in the last 168 hours. Recent Labs  Lab 07/29/17 1852  AMMONIA 13   Coagulation Profile: No results for input(s): INR, PROTIME in the last 168 hours. Cardiac Enzymes: Recent Labs  Lab 07/31/17 0230 07/31/17 0900  TROPONINI 1.02* 1.10*   BNP (last 3 results) No results for input(s): PROBNP in the last 8760 hours. HbA1C: No results for input(s): HGBA1C in the last 72 hours. CBG: Recent Labs  Lab 07/31/17 2017 08/01/17 0007 08/01/17 0428 08/01/17 0728 08/01/17 1208  GLUCAP 122* 124* 120* 115* 126*   Lipid Profile: No results for input(s): CHOL, HDL, LDLCALC, TRIG, CHOLHDL, LDLDIRECT in the last 72 hours. Thyroid Function Tests: Recent Labs    07/29/17 1852  TSH 1.506  FREET4 1.68*   Anemia Panel: Recent Labs    07/29/17 1852  VITAMINB12 1,199*   Sepsis Labs: No results for input(s): PROCALCITON, LATICACIDVEN in the last 168 hours.  No results found for this or any previous visit (from the past 240 hour(s)).       Radiology Studies: Dg Chest Port 1 View  Result Date: 08/01/2017 CLINICAL DATA:  Respiratory  failure with hypoxia. EXAM: PORTABLE CHEST 1 VIEW COMPARISON:  Radiograph of July 31, 2017. FINDINGS: Increased opacification of right hemithorax is noted most consistent with increasing effusion with associated atelectasis. Small amount of aerated lung is noted in right upper lobe. This results in mild left-to-right midline shift consistent with volume loss. Left lung is hyperexpanded but otherwise normal. No pneumothorax is noted. Nasogastric tube is seen entering stomach. Right internal jugular catheter is unchanged in position. Bony thorax is unremarkable. IMPRESSION: Increased opacification of right hemothorax is noted consistent with increasing pleural effusion with associated atelectasis. Small amount of aerated lung is noted in right upper lobe. Stable support apparatus. Electronically Signed   By: Marijo Conception, M.D.   On: 08/01/2017 07:33   Dg  Chest Port 1 View  Result Date: 07/31/2017 CLINICAL DATA:  Respiratory failure. EXAM: PORTABLE CHEST 1 VIEW COMPARISON:  07/30/2017 FINDINGS: Right jugular catheter terminates over the SVC. Enteric tube has been advanced in the interim, however the tip projects in the region of the GE junction. The cardiomediastinal silhouette is unchanged allowing for rightward patient rotation. There is increasing dense opacity in the right lung base likely reflecting an enlarging pleural effusion and atelectasis/consolidation. There is underlying right lung volume loss which is increased. The left lung is clear. IMPRESSION: 1. Enlarging right pleural effusion with increasing right basilar atelectasis/consolidation. 2. Slight interval advancement of enteric tube, with tip now near the GE junction. Electronically Signed   By: Logan Bores M.D.   On: 07/31/2017 09:41        Scheduled Meds: . amiodarone  400 mg Oral BID  . amLODipine  10 mg Oral Daily  . apixaban  5 mg Oral BID  . carvedilol  18.75 mg Oral BID WC  . clonazePAM  1 mg Per Tube BID  . clopidogrel   75 mg Oral Daily  . folic acid  1 mg Oral Daily  . furosemide  40 mg Intravenous BID  . mouth rinse  15 mL Mouth Rinse q12n4p  . pantoprazole sodium  40 mg Per Tube QHS  . potassium chloride  40 mEq Oral Daily  . QUEtiapine  50 mg Oral BID  . sodium chloride flush  10-40 mL Intracatheter Q12H  . sodium chloride flush  10-40 mL Intracatheter Q12H  . tamsulosin  0.4 mg Oral Daily  . thiamine  100 mg Oral Daily   Continuous Infusions: . sodium chloride Stopped (07/21/17 1607)  . sodium chloride    . feeding supplement (VITAL AF 1.2 CAL) 1,000 mL (08/01/17 0257)     LOS: 14 days    Time spent: > 35 min  Velvet Bathe, MD [facility] Pager [phone]  If 7PM-7AM, please contact night-coverage [email or web address] Password [facility] 08/01/2017, 2:23 PM

## 2017-08-01 NOTE — Progress Notes (Signed)
RN into room to medicate pt. PT's RR 50 with a sat of 88-95%. Pt noted to have some upper airway congestion. Y[place]auer able to suction some secretions. MD paged and made aware. Ordered to monitor respiratory status and make MD aware of any further decline. MD stating to hold seroquel and Clonazepam. WCTM

## 2017-08-02 DIAGNOSIS — I25111 Atherosclerotic heart disease of native coronary artery with angina pectoris with documented spasm: Secondary | ICD-10-CM

## 2017-08-02 LAB — CBC
HCT: 30.2 % — ABNORMAL LOW (ref 39.0–52.0)
Hemoglobin: 9.4 g/dL — ABNORMAL LOW (ref 13.0–17.0)
MCH: 31.5 pg (ref 26.0–34.0)
MCHC: 31.1 g/dL (ref 30.0–36.0)
MCV: 101.3 fL — ABNORMAL HIGH (ref 78.0–100.0)
PLATELETS: 362 10*3/uL (ref 150–400)
RBC: 2.98 MIL/uL — AB (ref 4.22–5.81)
RDW: 15.1 % (ref 11.5–15.5)
WBC: 20.2 10*3/uL — AB (ref 4.0–10.5)

## 2017-08-02 LAB — BASIC METABOLIC PANEL
Anion gap: 9 (ref 5–15)
BUN: 74 mg/dL — ABNORMAL HIGH (ref 6–20)
CALCIUM: 8 mg/dL — AB (ref 8.9–10.3)
CO2: 27 mmol/L (ref 22–32)
CREATININE: 2.08 mg/dL — AB (ref 0.61–1.24)
Chloride: 116 mmol/L — ABNORMAL HIGH (ref 101–111)
GFR, EST AFRICAN AMERICAN: 37 mL/min — AB (ref >60)
GFR, EST NON AFRICAN AMERICAN: 32 mL/min — AB (ref >60)
Glucose, Bld: 162 mg/dL — ABNORMAL HIGH (ref 65–99)
Potassium: 3.8 mmol/L (ref 3.5–5.1)
SODIUM: 152 mmol/L — AB (ref 135–145)

## 2017-08-02 LAB — GLUCOSE, CAPILLARY
GLUCOSE-CAPILLARY: 132 mg/dL — AB (ref 65–99)
GLUCOSE-CAPILLARY: 162 mg/dL — AB (ref 65–99)
GLUCOSE-CAPILLARY: 66 mg/dL (ref 65–99)
Glucose-Capillary: 137 mg/dL — ABNORMAL HIGH (ref 65–99)

## 2017-08-02 MED ORDER — SODIUM CHLORIDE 0.9 % IV BOLUS (SEPSIS)
250.0000 mL | Freq: Once | INTRAVENOUS | Status: AC
  Administered 2017-08-02: 250 mL via INTRAVENOUS

## 2017-08-02 MED ORDER — SODIUM CHLORIDE 0.9 % IV BOLUS (SEPSIS)
1000.0000 mL | Freq: Once | INTRAVENOUS | Status: AC
  Administered 2017-08-02: 1000 mL via INTRAVENOUS

## 2017-08-02 MED ORDER — FREE WATER: 200.0000 mL | Freq: Four times a day (QID) | Status: DC

## 2017-08-02 MED ORDER — PRO-STAT SUGAR FREE PO LIQD: 30.0000 mL | Freq: Three times a day (TID) | ORAL | Status: DC

## 2017-08-02 MED ORDER — JEVITY 1.2 CAL PO LIQD
1000.0000 mL | ORAL | Status: DC
  Filled 2017-08-02 (×2): qty 1000

## 2017-08-07 NOTE — Death Summary Note (Signed)
Death Summary  Dmauri Rosenow Sr. QJF:[id number] DOB: [birth year] DOA: Aug 10, 2017  PCP: Adin Hector, MD  Admit date: 08/10/2017 Date of Death: 2017-08-25  Final Diagnoses:  Active Problems:  Cardiogenic shock (HCC)   Shock live   Shock ([place])   Bradycardia   Externally paced cardiac rhytm   Hypotension   Acute respiratory distress   Ventilator dependent (HCC)   Acute respiratory failure (HCC)   Acute respiratory acidosis   Acute encephalopathy   Cardiac arrest (HCC)   Alcohol withdrawal syndrome, with delirium (HCC)   Paroxysmal atrial fibrillation (HCC)   Anticoagulation adequate   Anticoagulant long-term use   History of present illness:  66 year old who was found down. Diagnosed with cardiac arrest, cardiogenic shock, shock liver.  Hospital Course:  Patient was found 08-11-2023 he was found face down for unknown period of time. Patient was found to be diaphoretic and hypotensive at the moment. Critical care team admitted patient. Patient required pressors to maintain blood pressure. Patient was found to have NSTEMI and cardiology was going to continue medical management. Of note patient had 90% blockage to ostial to proximal RCA and 99% blockage to mid RCA.  While on the floor patient had sudden onset and blood pressure changes fluid bolus was ordered but despite treatment patient condition deteriorated. Patient was DO NOT RESUSCITATE and passed away.   Time: 1520  Signed:  Velvet Bathe  [facility] 08-25-17, 3:31 PM

## 2017-08-07 NOTE — Progress Notes (Signed)
Staff unable to palpate pulse or hear heart beats. MD was informed.

## 2017-08-07 NOTE — Progress Notes (Signed)
No charge note:   Palliative consult received.   Left message for patient's spouse- Manuela Schwartz. Requested return call to arrange [place] meeting.  Mariana Kaufman, AGNP-C Palliative Medicine  Please call Palliative Medicine team phone with any questions [phone]. For individual providers please see AMION.

## 2017-08-07 NOTE — Care Management Important Message (Signed)
Important Message  Patient Details  Name: Adam Seeley Sr. MRN: [id number] Date of Birth: 10-02-1950   Medicare Important Message Given:  Yes    [name] 08/28/2017, 10:36 AM

## 2017-08-07 NOTE — Progress Notes (Signed)
MD at the bedside. Patient passed away at 15:12 o'clock. Family at bedside. Per family request, RN called chaplain.

## 2017-08-07 NOTE — Progress Notes (Signed)
Nutrition Follow-up  DOCUMENTATION CODES:   Non-severe (moderate) malnutrition in context of chronic illness  INTERVENTION:    Discontinue Vital AF 1.2   Provide Jevity 1.2 @ 55 mL/hr via NGT  30 mL Pro-Stat TID  TF regimen provides 1884 calories, 118 grams protein, and 1069 mL free water.  Provide 200 mL free water every 6 hours, total of 1869 mL free water daily    NUTRITION DIAGNOSIS:   Moderate Malnutrition related to chronic illness(lung mass highly suspicious for malignancy, hx of EtOH abuse) as evidenced by moderate fat depletion, mild muscle depletion.  Ongoing   GOAL:   Provide needs based on ASPEN/SCCM guidelines  Met via TF regimen  MONITOR:   TF tolerance, Vent status, Labs, Weight trends  ASSESSMENT:   66 yo male admitted after being found unresponsive with acute respiratory failure requiring intubation (CT chest with 2.9 cm RUL cavitary spiculated nodule and 3.7 cm RLL mass highly suspicious for malignancy, septic vs cardiogenic shock. Pt with hx of CAD s/p RCA stent in 2013, AAA s/p open repair in 2016, HTN, HLD, EtOH abuse  Pt did not communicate at visit. Palliative care consulted for GOC discussion. Per MD note, pt suspected to have some anoxic brain injury.  Spoke with RN, pt continues to tolerate tube feeding regimen. Pt with stabilizing weight trend. Per chart pt's UBW seems to be ~140 lbs for the past couple years.    Filed Weights   07/31/17 0449 08/01/17 0439 07/21/2017 0521  Weight: 142 lb (64.4 kg) 140 lb 14.4 oz (63.9 kg) 140 lb 10.5 oz (63.8 kg)   Labs reviewed; CBG 115-162, Na 152, Chloride 116, BUN 74, Hemoglobin 9.4 Medications reviewed; folic acid, Lasix, Protonix, potassium chloride, thiamine  Diet Order:  Diet NPO time specified  EDUCATION NEEDS:   Not appropriate for education at this time  Skin:  Skin Assessment: Reviewed RN Assessment(no pressure ulcers documented)  Last BM:  08/01/17  Height:   Ht Readings from Last 1  Encounters:  07/27/17 5' 10" (1.778 m)    Weight:   Wt Readings from Last 1 Encounters:  07/10/2017 140 lb 10.5 oz (63.8 kg)    Ideal Body Weight:  75.4 kg  BMI:  Body mass index is 20.18 kg/m.  Estimated Nutritional Needs:   Kcal:  1855-2055 kcal/day  Protein:  110-120 g/day  Fluid:  > 2 L/day  Allison Ioannides, MS, RDN, LDN 07/24/2017 2:37 PM  

## 2017-08-07 NOTE — Progress Notes (Signed)
Progress Note  Patient Name: Adam Roylance Sr. Date of Encounter: 08-31-17  Primary Cardiologist: Dr. Claiborne Billings  Subjective   No chest pain and no SOB currently, pt does answer questions.  Inpatient Medications    Scheduled Meds: . amiodarone  400 mg Oral BID  . amLODipine  10 mg Oral Daily  . apixaban  5 mg Oral BID  . carvedilol  18.75 mg Oral BID WC  . clonazePAM  1 mg Per Tube BID  . clopidogrel  75 mg Oral Daily  . folic acid  1 mg Oral Daily  . furosemide  40 mg Intravenous BID  . mouth rinse  15 mL Mouth Rinse q12n4p  . pantoprazole sodium  40 mg Per Tube QHS  . potassium chloride  40 mEq Oral Daily  . QUEtiapine  50 mg Oral BID  . sodium chloride flush  10-40 mL Intracatheter Q12H  . sodium chloride flush  10-40 mL Intracatheter Q12H  . tamsulosin  0.4 mg Oral Daily  . thiamine  100 mg Oral Daily   Continuous Infusions: . sodium chloride Stopped (07/21/17 1607)  . sodium chloride    . feeding supplement (VITAL AF 1.2 CAL) 1,000 mL (Aug 31, 2017 0631)   PRN Meds: sodium chloride, [CANCELED] Place/Maintain arterial line **AND** sodium chloride, atropine, hydrALAZINE, ipratropium-albuterol, LORazepam, sodium chloride flush, sodium chloride flush   Vital Signs    Vitals:   08/01/17 1900 08/01/17 2023 08/01/17 2300 31-Aug-2017 0521  BP: (!) 103/44  (!) 94/57 (!) 108/52  Pulse: (!) 56 64 61 61  Resp: (!) 38  (!) 50 (!) 40  Temp: 99.1 F (37.3 C)   98.3 F (36.8 C)  TempSrc: Axillary   Oral  SpO2: 97%  94% 100%  Weight:    140 lb 10.5 oz (63.8 kg)  Height:        Intake/Output Summary (Last 24 hours) at 08/31/2017 0938 Last data filed at 08-31-17 0700 Gross per 24 hour  Intake 0 ml  Output 1025 ml  Net -1025 ml   Filed Weights   07/31/17 0449 08/01/17 0439 August 31, 2017 0521  Weight: 142 lb (64.4 kg) 140 lb 14.4 oz (63.9 kg) 140 lb 10.5 oz (63.8 kg)    Telemetry    SR with occ PVCs - Personally Reviewed  ECG    Today read as a fib but  baseline with artifact.  On tele SR has septal infarct - Personally Reviewed  Physical Exam   GEN: No acute distress.  Tube in place Neck: No JVD Cardiac: RRR, no murmurs, rubs, or gallops.  Respiratory: bilateral breath sounds to auscultation bilaterally. But with rhonchi  GI: Soft, nontender, non-distended  MS: No edema; No deformity. SCD stockings in place  Neuro: slow to answer, follows some commands  Psych: flat affect   Labs    Chemistry Recent Labs  Lab 07/29/17 0411 07/29/17 0949 07/30/17 0549 07/31/17 0230 2017-08-31 0348  NA 143  --  145 144 152*  K 3.2*  --  3.4* 3.0* 3.8  CL 103  --  107 108 116*  CO2 30  --  30 28 27   GLUCOSE 128*  --  117* 122* 162*  BUN 43*  --  35* 40* 74*  CREATININE 1.33*  --  1.34* 1.53* 2.08*  CALCIUM 9.1  --  9.1 8.8* 8.0*  PROT 5.9* 5.9*  --   --   --   ALBUMIN 2.7* 2.7*  --   --   --   AST  33 32  --   --   --   ALT 137* 128*  --   --   --   ALKPHOS 106 91  --   --   --   BILITOT 0.8 1.2  --   --   --   GFRNONAA 54*  --  54* 46* 32*  GFRAA >60  --  >60 53* 37*  ANIONGAP 10  --  8 8 9      Hematology Recent Labs  Lab 07/30/17 0549 07/31/17 0230 Aug 19, 2017 0348  WBC 13.9* 13.3* 20.2*  RBC 3.30* 3.23* 2.98*  HGB 10.1* 10.0* 9.4*  HCT 33.1* 32.0* 30.2*  MCV 100.3* 99.1 101.3*  MCH 30.6 31.0 31.5  MCHC 30.5 31.3 31.1  RDW 14.7 14.3 15.1  PLT 329 305 362    Cardiac Enzymes Recent Labs  Lab 07/31/17 0230 07/31/17 0900  TROPONINI 1.02* 1.10*   No results for input(s): TROPIPOC in the last 168 hours.   BNP Recent Labs  Lab 07/29/17 0930  BNP 3,357.3*     DDimer No results for input(s): DDIMER in the last 168 hours.   Radiology    Dg Chest Port 1 View  Result Date: 08/01/2017 CLINICAL DATA:  Respiratory failure with hypoxia. EXAM: PORTABLE CHEST 1 VIEW COMPARISON:  Radiograph of July 31, 2017. FINDINGS: Increased opacification of right hemithorax is noted most consistent with increasing effusion with  associated atelectasis. Small amount of aerated lung is noted in right upper lobe. This results in mild left-to-right midline shift consistent with volume loss. Left lung is hyperexpanded but otherwise normal. No pneumothorax is noted. Nasogastric tube is seen entering stomach. Right internal jugular catheter is unchanged in position. Bony thorax is unremarkable. IMPRESSION: Increased opacification of right hemothorax is noted consistent with increasing pleural effusion with associated atelectasis. Small amount of aerated lung is noted in right upper lobe. Stable support apparatus. Electronically Signed   By: Marijo Conception, M.D.   On: 08/01/2017 07:33    Cardiac Studies   Echo 07/19/2017   - Left ventricle: The cavity size was normal. There was moderate concentric hypertrophy. Systolic function was normal. The estimated ejection fraction was in the range of 60% to 65%. Wall motion was normal; there were no regional wall motion abnormalities. - Mitral valve: Calcified annulus. - Right ventricle: Systolic function was moderately reduced. - Tricuspid valve: There was mild regurgitation. - Pericardium, extracardiac: The pericardium appears thickened at the apex as well as the apical RV wall with small pericardial effusion over the apex. Suggest cardiac MRI to evaluate pericardium further.   Cath 07/25/2017  Ost 1st Mrg lesion is 50% stenosed.  Ost Cx to Mid Cx lesion is 25% stenosed.  Prox LAD to Mid LAD lesion is 25% stenosed.  Ost RCA to Prox RCA lesion is 90% stenosed.  Mid RCA lesion is 99% stenosed.  Ost RPDA to RPDA lesion is 90% stenosed.  Previously placed Dist RCA stent (unknown type) is widely patent.  LV end diastolic pressure is normal.  There is no aortic valve stenosis.  Subtotally occluded RCA with thrombus, left to right collaterals. Continue medical therapy.      Patient Profile     66 y.o. male with CAD s/p RCA PCI in 2013, PAD (AAA,  aortobifem bypass in 2016), hypertension and bradycardia admitted 11/11 with syncope found down for unknown period of time.  Was in cardiogenic shock on admit and HR 15 with external pacer.  Underwent LHC and found to have  subtotal occlusion with heavy thrombus burden in the right coronary artery (with formed left to right collaterals), deemed best for medical therapy.He also had hypoxic respiratory failure, extubated 11/18.   Assessment & Plan    NSTEMI   Troponin pk at 29, CAD on cath Left heart catheterization revealed 50% OM1, 25% left circumflex, 25% proximal LAD, 90% ostial to proximal RCA, 99% mid RCA, 90% R PDA.  There were also left to right collaterals.  Medical management was recommended.  Continue clopidogrel 75 mg daily.  He has a history of alcohol abuse and LFTs remain elevated. No statin at this time.  Continue carvedilol. Will likely need to hold plavix for 5 days or check  P2y before any biopsy and hold eliquis   R UL/LL mass suspicious for lung cancer He will have an outpatient biopsy once stable  Syncope, no recurrent events  Presumed diastolic heart failure  CVP was 13 with BNP >3000,  On lasix 40 mg BID IV -Echo noted thickened pericardium not a candidate for MRI currently   PAF maintaining SR to Junct rhythm on amiodarone and coreg.  On Eliquis new this admit would hold for lung biopsy  HTN controlled to low at 94/57 on amlodipine ? Stop if BP remains low  Acute on chronic renal failure with Cr today 2.08 up from 1.34, 1.53  Na 152 and Cl 116    desat last pm CXR yesterday Increased opacification of right hemothorax is noted consistent with increasing pleural effusion with associated atelectasis. Small amount of aerated lung is noted in right upper lobe. Stable support Apparatus. -? Thoracentesis   Encephalopathy -limited interaction twin brother in the room    For questions or updates, please contact [facility] Please consult [email or web address] for contact info  under Cardiology/STEMI.      Signed, Cecilie Kicks, NP  August 21, 2017, 9:38 AM    Attending Note:    Pt passed away prior to being seen by the attending cardiologist   Ramond Dial., MD, [facility] 08/21/2017, 3:47 PM [address]  [place] Pager [phone]

## 2017-08-07 NOTE — Progress Notes (Signed)
Chaplain answered page to this family who just loss this patient.  Wife of 42 years at bedside.  Daughter, son and twin brother also at bedside.  Family sharing a few memories of the patient.  Emotional support provided.  Prayer with family members and prayer with brother who will be going to tell the mother who is at home.  Family supporting one anther with hugs and tender cries.  Compassionate presence provided of which the family is grateful for.  Thank you for calling me to come be with this family.  Thank you to the medical team in caring for this patient and family.    08-24-2017 1600  Clinical Encounter Type  Visited With Patient and family together  Visit Type Initial;Spiritual support;Social support;Death  Spiritual Encounters  Spiritual Needs Prayer;Emotional;Grief support

## 2017-08-07 NOTE — Progress Notes (Signed)
Bed control notified that Post Mortem checklist is complete. The body was transported to [facility].

## 2017-08-07 NOTE — Progress Notes (Addendum)
MD was paged again. Patient's BP is getting lower - 65/40, P=42; RR=35.Rhonchi on ascultation.  Per MD order, patient will received NS 1000cc bolus. Continue suction and call RT for deep suctioning. Will continue to monitor. Staff called Palliative Care and informed that the family is in the patient's room.

## 2017-08-07 NOTE — Progress Notes (Signed)
Patient has low BP = 81/39 with Pulse=50 and RR=25. MD made aware. Per MD patient is receiving NS 250cc - bolus. Will continue to monitor.

## 2017-08-07 DEATH — deceased

## 2017-10-29 ENCOUNTER — Encounter (HOSPITAL_COMMUNITY): Payer: Medicare HMO

## 2017-10-29 ENCOUNTER — Ambulatory Visit: Payer: Medicare HMO | Admitting: Family

## 2018-04-28 IMAGING — CT CT ANGIO CHEST-ABD-PELV FOR DISSECTION W/ AND WO/W CM
2 of 9 series · 11 of 46 positions shown, 12 images · IV contrast (OMNI)
Comparison: 05/02/2015 CT of the abdomen and pelvis.

CLINICAL DATA: 66 y/o  M; found face down with central chest pain.

EXAM:
CT ANGIOGRAPHY CHEST, ABDOMEN AND PELVIS
TECHNIQUE: Multidetector CT imaging through the chest, abdomen and pelvis was
performed using the standard protocol during bolus administration of
intravenous contrast. Multiplanar reconstructed images and MIPs were
obtained and reviewed to evaluate the vascular anatomy.
CONTRAST:  100mL [id number] IOPAMIDOL ([id number]) INJECTION 76%

[Series 6: dissection 3.0 i30f 3 · axial · 0.83mm/px · z∈[-901,-304]mm · 8 of 229 slices shown, 9 images]
[im 15/229  soft-tissue]
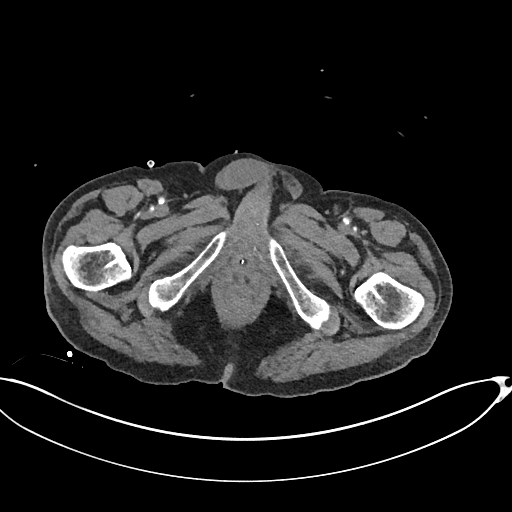
[im 15/229  bone]
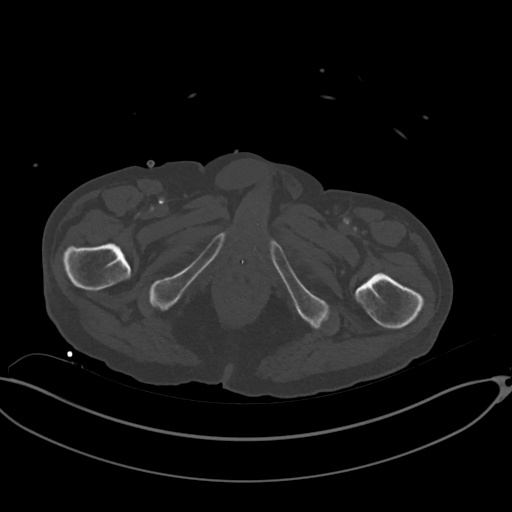
[im 43/229  soft-tissue]
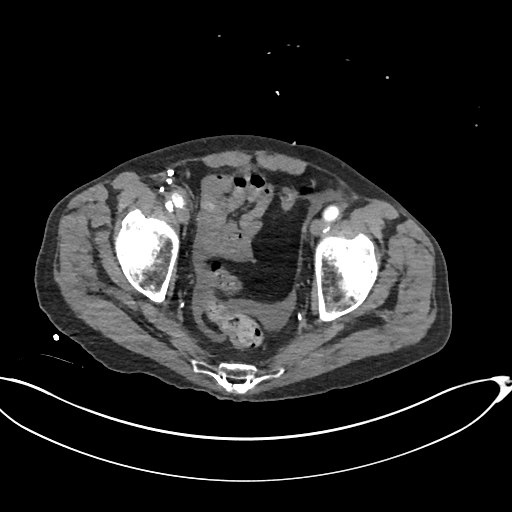
[im 72/229  soft-tissue]
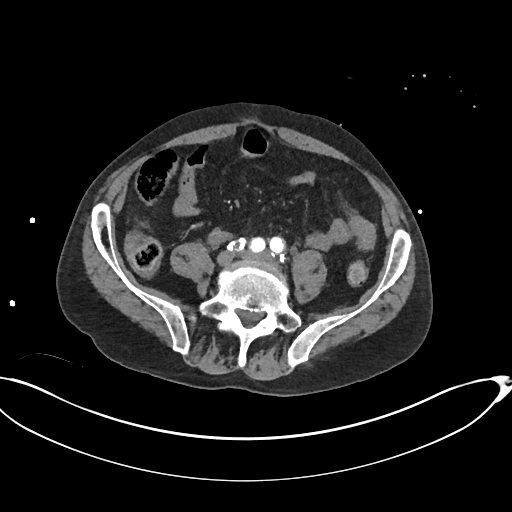
[im 100/229  soft-tissue]
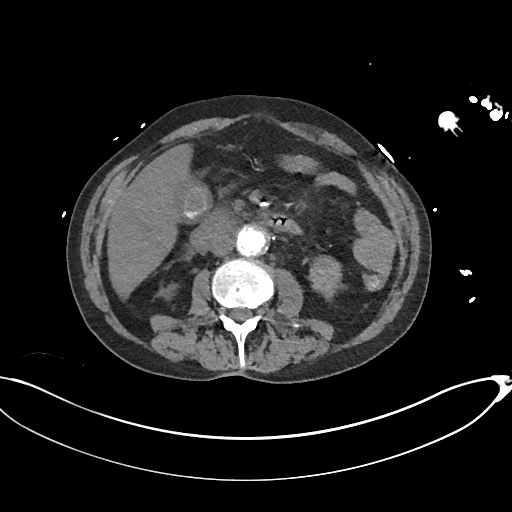
[im 129/229  soft-tissue]
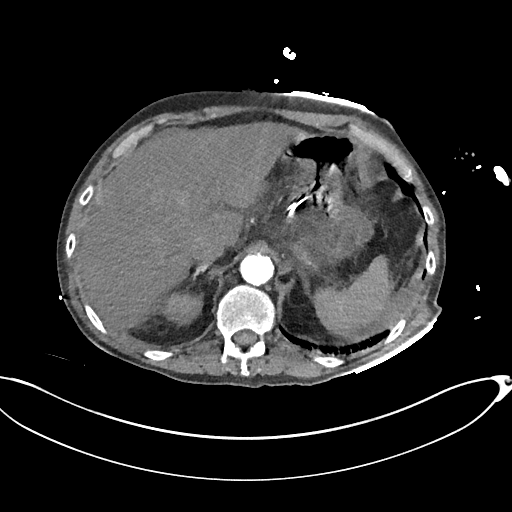
[im 157/229  soft-tissue]
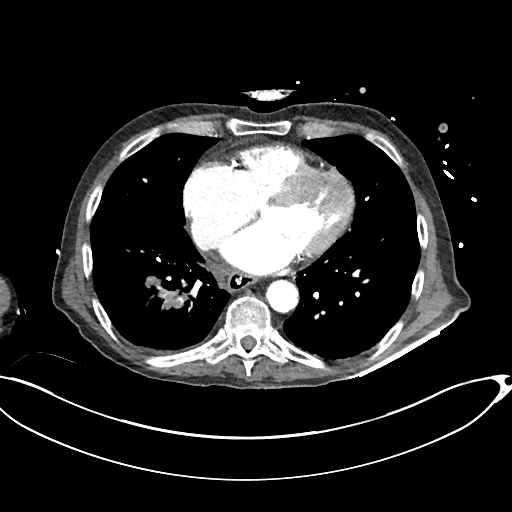
[im 186/229  soft-tissue]
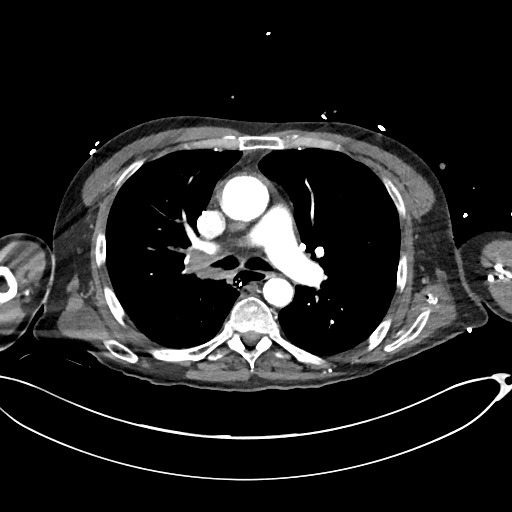
[im 214/229  soft-tissue]
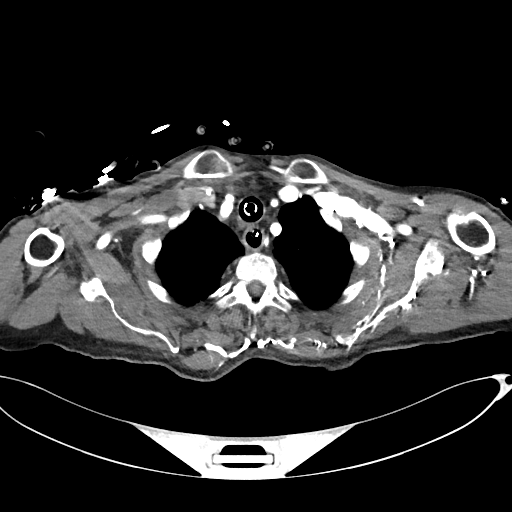

[Series 9: coronals · coronal · 0.67mm/px · 3 of 145 slices shown]
[im 37/145  soft-tissue]
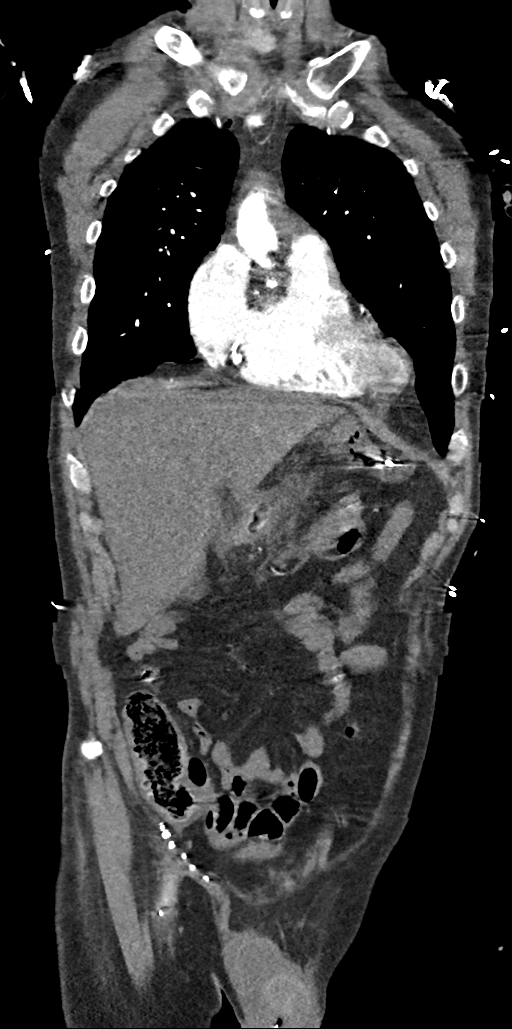
[im 73/145  soft-tissue]
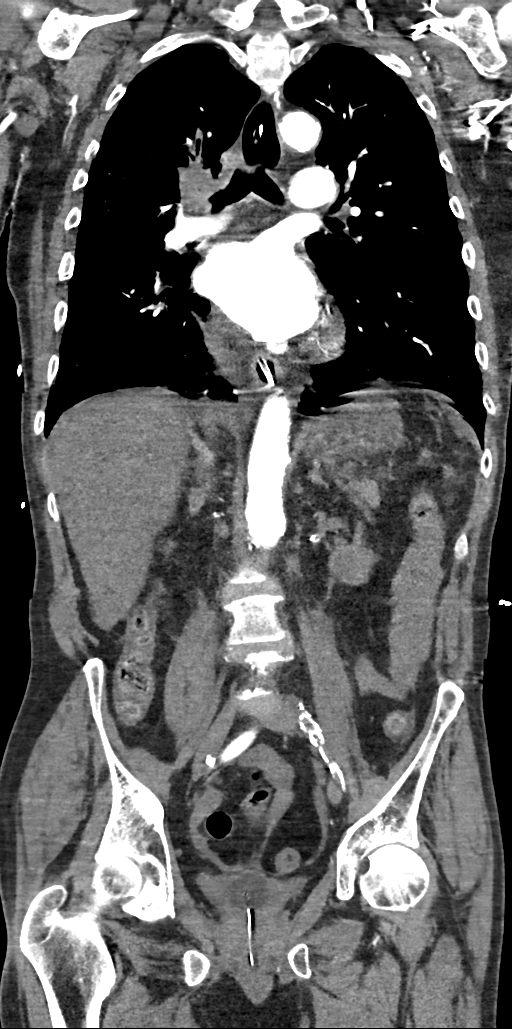
[im 109/145  soft-tissue]
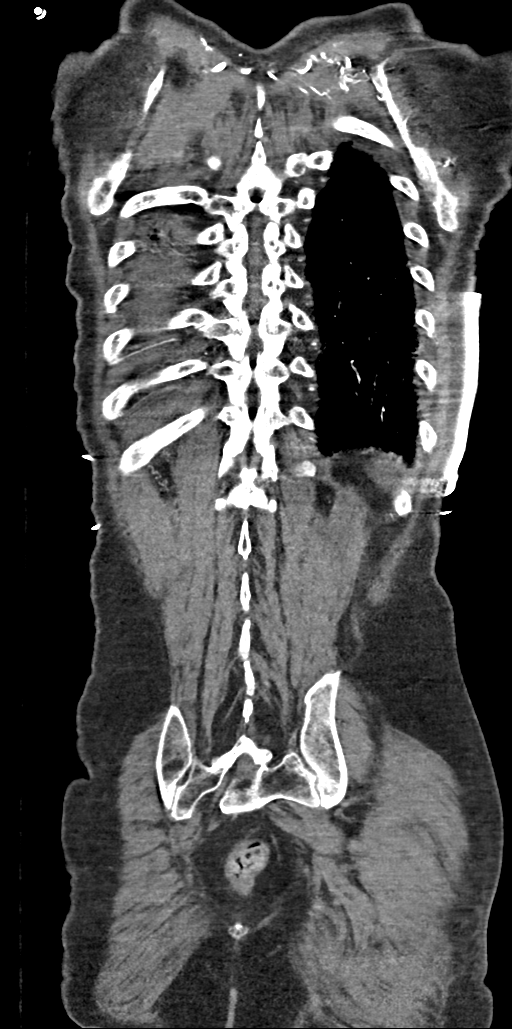

[11 of 46 positions shown; findings below may reference images not displayed]

FINDINGS: CTA CHEST FINDINGS

Cardiovascular: Preferential opacification of the thoracic aorta. No
evidence of thoracic aortic aneurysm or dissection. Moderate
calcific atherosclerosis. Mixed plaque of left common carotid artery
origin with moderate 50% stenosis. Mild cardiomegaly. Severe
coronary artery calcification.

Mediastinum/Nodes: Sub- carinal lymph node measuring 16 x 26 mm
(series 6, image 53). Otherwise no mediastinal adenopathy. Enteric
tube tip in the gastric body. Patulous esophagus. Normal thyroid
gland.

Lungs/Pleura: Right upper lobe cavitary nodule measuring 2.7 x 2.8 x
2.9 cm (AP x ML x CC series 6, image 36 and series 9, image 92). The
nodule is contiguous with suprahilar airways and pulmonary arteries.
A segmental right upper lobe pulmonary artery is attenuated as it
traverses the mass and several airways are occluded (series 9, image
75). Contiguous soft tissue extending into the right hilum abuts the
carina posteriorly (series 6, image 43).

Right lower lobe mass measuring 3.7 x 2.9 x 3.7 cm (series 6, image
70 and series 9, image 91). The mass occludes at the develops
segmental bronchi. 12 mm discrete satellite nodule inferiorly and in
the right lower lobe (series 6, image 77).

Musculoskeletal: No acute fracture identified. Mild multilevel
degenerative changes of the spine

Review of the MIP images confirms the above findings.

CTA ABDOMEN AND PELVIS FINDINGS

VASCULAR

Aorta: Infrarenal abdominal aortic aneurysm repair measuring up to
3.2 cm. Extensive fibrofatty plaque with plaque ulceration.

Celiac: Patent celiac origin. Diffuse attenuation of the patent
splenic and hepatic arteries.

SMA: Severe stenosis of SMA approximately 3.7 cm downstream to the
origin (series 10, image 83).

Renals: Severe atherosclerotic disease of bilateral renal artery is
with long segments of severe stenosis/ near occlusion.

IMA: Occluded.

Inflow: Bilateral aortofemoral bypass are patent. Severe stenosis of
proximal femoral arteries bilaterally.

Veins: No obvious venous abnormality within the limitations of this
arterial phase study.

Review of the MIP images confirms the above findings.

NON-VASCULAR

Hepatobiliary: Hepatic steatosis. Reflux of contrast into the liver
may represent heart failure. Severe gallbladder wall thickening.
Layering density within the gallbladder may represent stones or
vicarious excretion of contrast.

Pancreas: Diffuse edema surrounding the pancreas. Suboptimal
contrast timing to evaluate for pancreatic necrosis. No pancreatic
ductal dilatation.

Spleen: Normal in size without focal abnormality.

Adrenals/Urinary Tract: No focal kidney lesion. No hydronephrosis.
The bladder is collapsed around Foley catheter. Normal adrenal
glands.

Stomach/Bowel: No obstructive or inflammatory changes of the small
and large bowel. Normal stomach. Edema surrounding the duodenum
probably related to pancreatic inflammation or gallbladder
inflammation.

Lymphatic: No lymphadenopathy identified.

Reproductive: Moderate prostate enlargement

Other: Small volume of ascites.

Musculoskeletal: No acute fracture identified. Mild multilevel
degenerative changes of the spine.

Review of the MIP images confirms the above findings.
IMPRESSION: CTA chest:

[DATE] cm right upper lobe cavitary spiculated nodule and 3.7 cm
right lower lobe mass. Findings probably represent synchronous or
primary/metastatic lung cancer. Distant metastatic disease is less
likely. The right upper lobe lesion invades the hilum. There is a
satellite nodule inferior to the right lower lobe lesion. Sub-
carinal lymphadenopathy.
2. No aortic dissection.
3. Left common carotid artery origin fibrofatty plaque with moderate
50% stenosis.
4. Cardiomegaly and severe coronary artery calcification.
CT abdomen and pelvis:

1. Diffuse gallbladder wall thickening and extensive inflammation
surrounding the pancreas. Inflammation is likely due to acute
pancreatitis with either reactive changes of the gallbladder or
concurrent acute cholecystitis.
2. No aortic dissection.
3. Infrarenal abdominal aortic repair with patent bilateral
aortofemoral bypass. Extensive atherosclerotic disease with severe
proximal femoral artery stenosis, severe mid SMA stenosis, severe
bilateral renal artery stenosis.
4. Layering density within the gallbladder may represent small
stones or vicarious excretion of contrast.
5. Small volume of ascites.
These results will be called to the ordering clinician or
representative by the Radiologist Assistant, and communication
documented in the PACS or zVision Dashboard.

By: Javari Krull M.D.

## 2018-04-29 IMAGING — DX DG CHEST 1V PORT
1 series · 1 of 1 positions shown · non-contrast
Comparison: Yesterday

CLINICAL DATA: Respiratory failure

EXAM:
PORTABLE CHEST 1 VIEW

[chest]
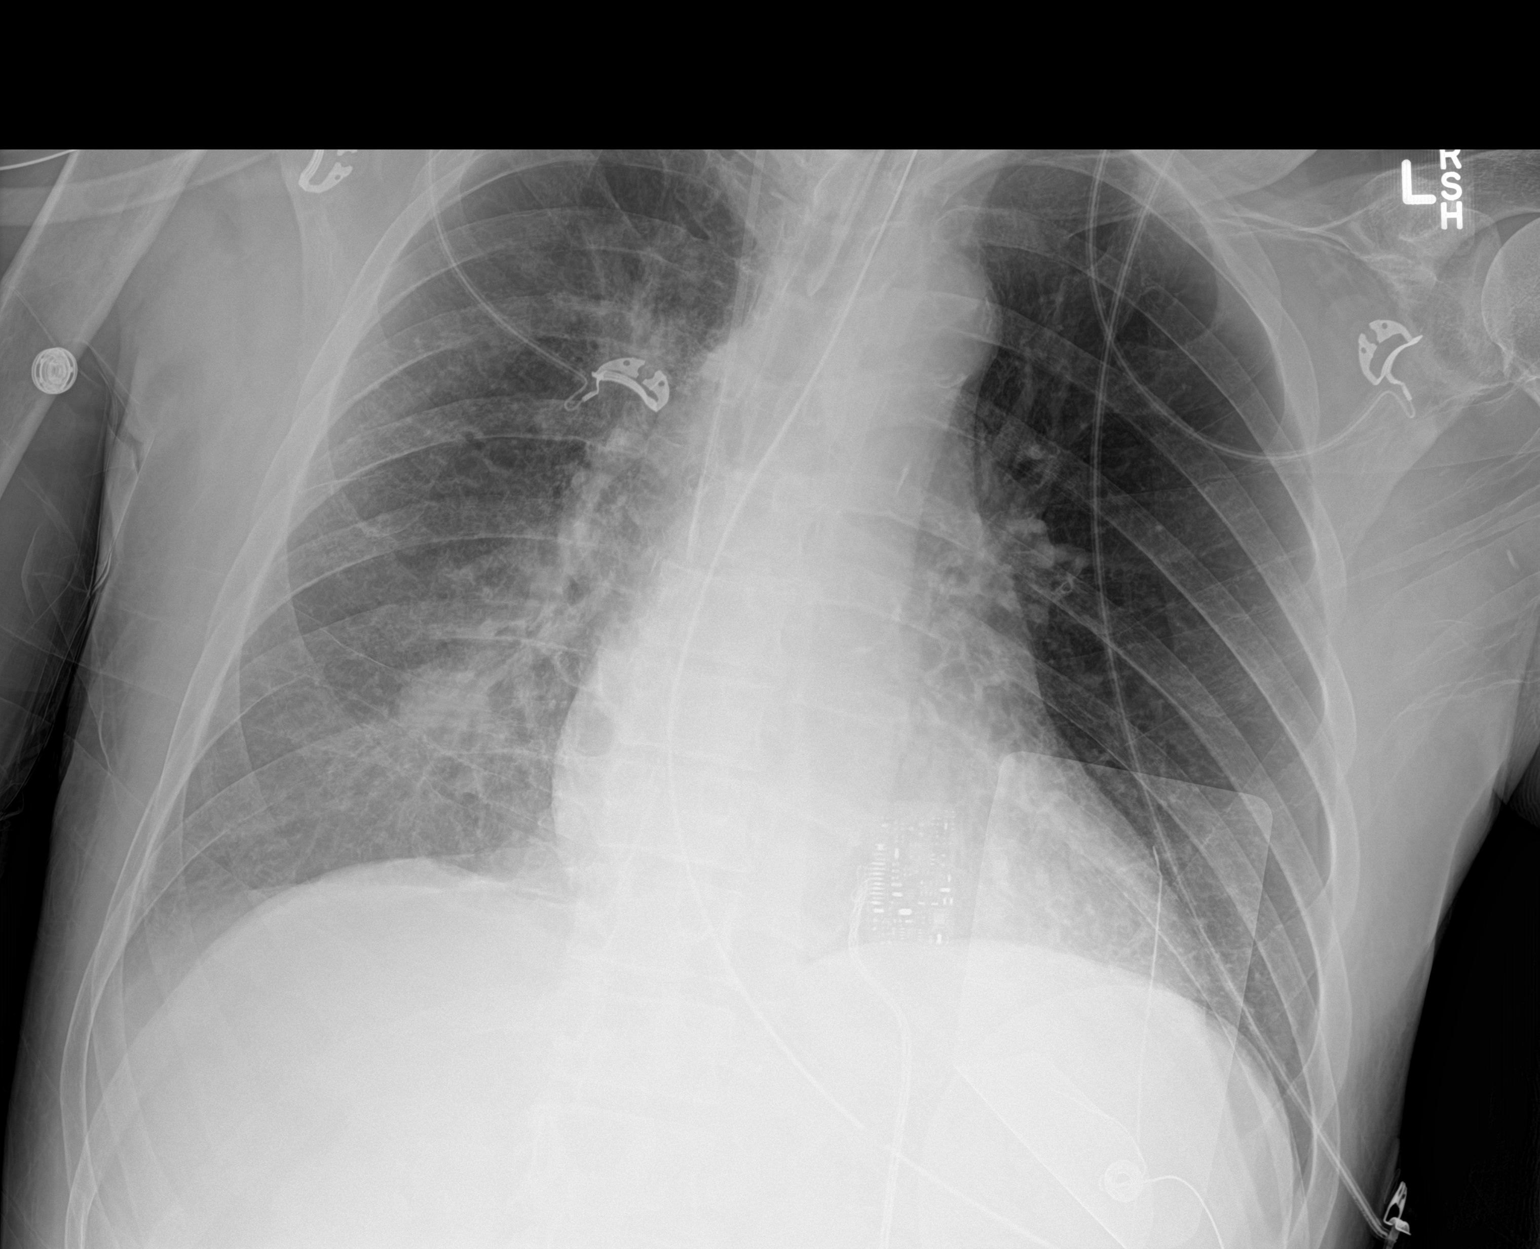

[1 of 1 positions shown; findings below may reference images not displayed]

FINDINGS: Endotracheal tube, NG tube, right jugular central venous catheter
are stable. Right infrahilar mass is stable. Right suprahilar
opacity is stable. Left lung is clear.
IMPRESSION: Stable examination.

Right infrahilar mass.

Right suprahilar opacity.

## 2018-05-01 IMAGING — DX DG CHEST 1V PORT
1 series · 1 of 1 positions shown · non-contrast
Comparison: 07/20/2017 and CT chest 07/18/2017.

CLINICAL DATA: Endotracheal tube, acute respiratory failure.

EXAM:
PORTABLE CHEST 1 VIEW

[chest ap]
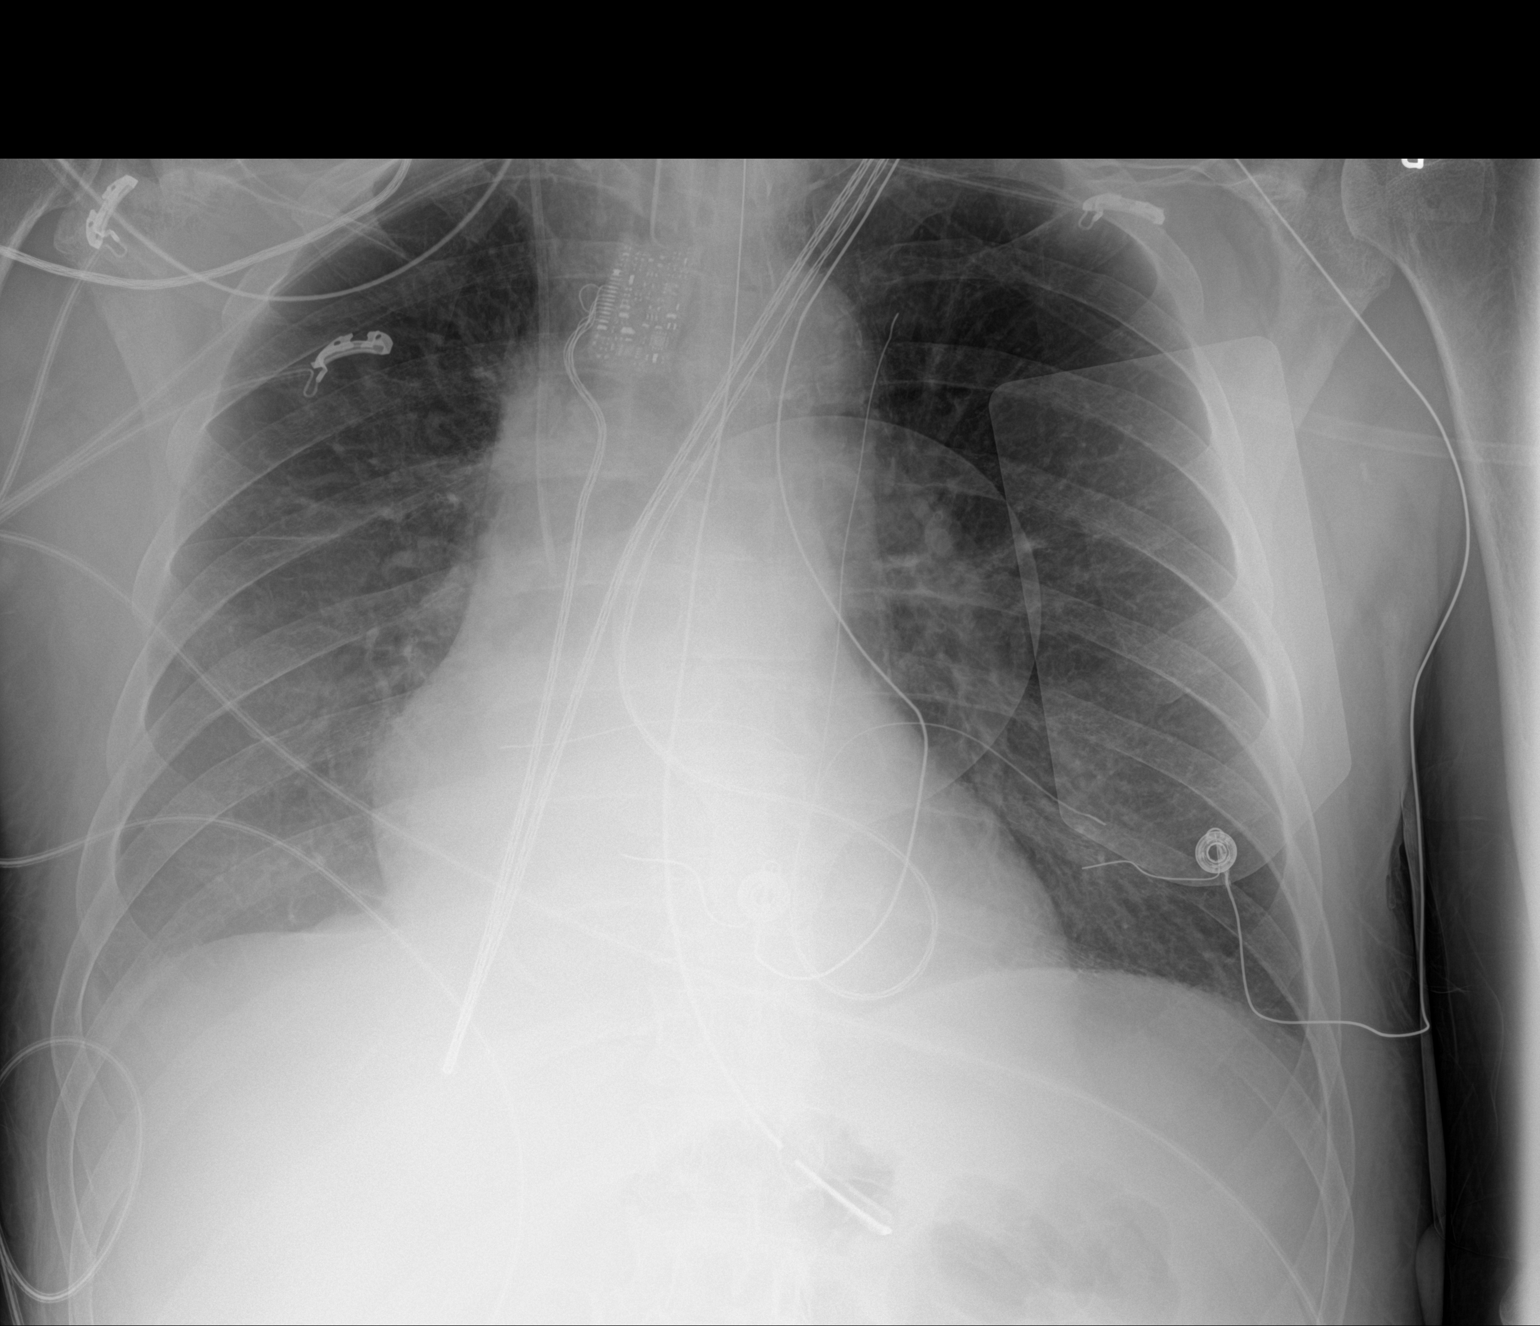

[1 of 1 positions shown; findings below may reference images not displayed]

FINDINGS: Endotracheal tube terminates 6.5 cm above the carina. Nasogastric
tube terminates just beyond the gastroesophageal junction.
Defibrillator pads project over the left hemithorax. Heart size is
grossly stable. Thoracic aorta is calcified. Cavitary lesion in the
medial aspect of the right upper lobe and an irregular area of
nodular consolidation in the right lower lobe are poorly visualized.
Small right pleural effusion. Findings appear unchanged from
yesterday.
IMPRESSION: 1. Small right pleural effusion.
2. Known irregular nodular lesions in the right upper and right
lower lobes are obscured.
3.  Aortic atherosclerosis ([id number]-[id number]).

## 2019-02-20 IMAGING — US US ABDOMEN COMPLETE
1 series · 13 of 25 positions shown · non-contrast
Comparison: Abdominal CT from yesterday

CLINICAL DATA: Pancreatitis

EXAM:
ABDOMEN ULTRASOUND COMPLETE

[Series 1: us abdomen complete · 0.20mm/px · 13 of 146 slices shown]
[im 1/146]
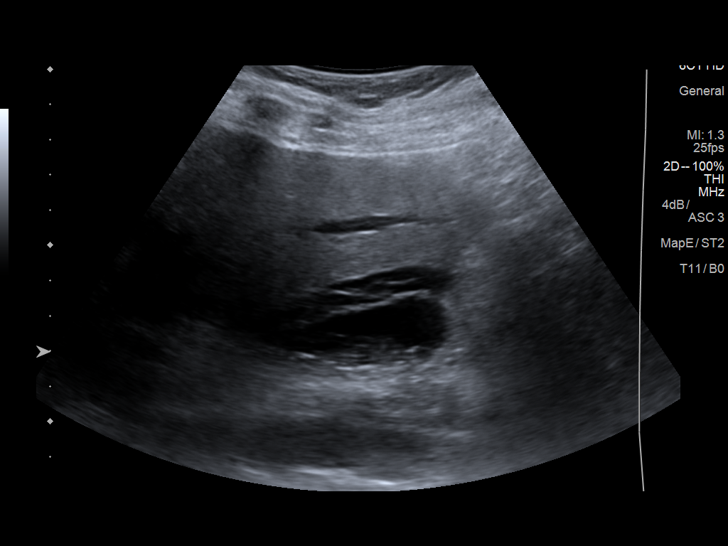
[im 13/146]
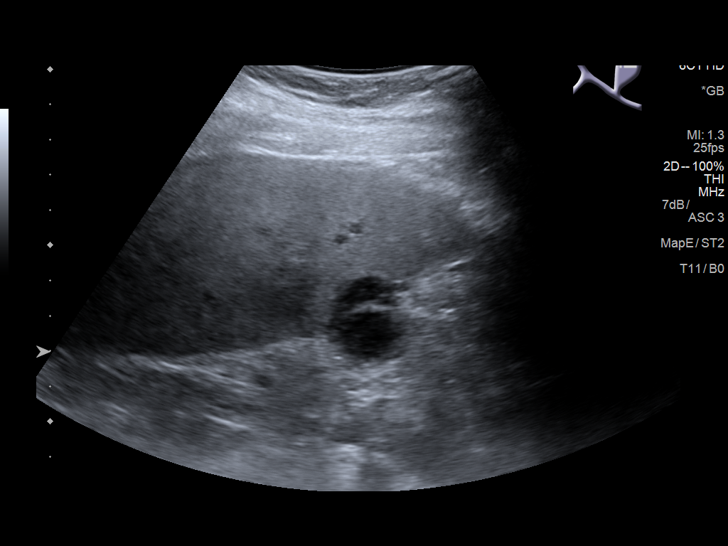
[im 25/146]
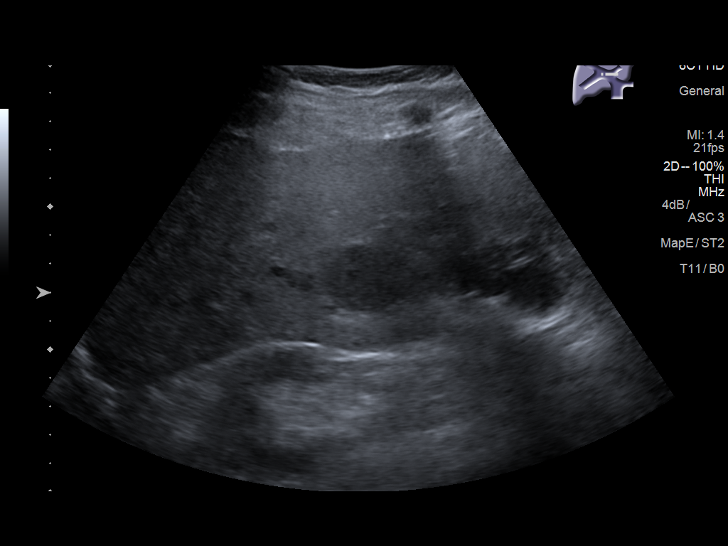
[im 37/146]
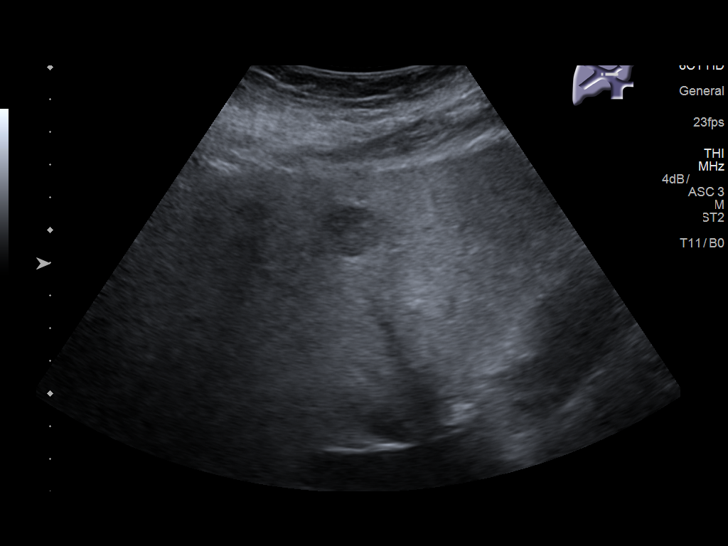
[im 49/146]
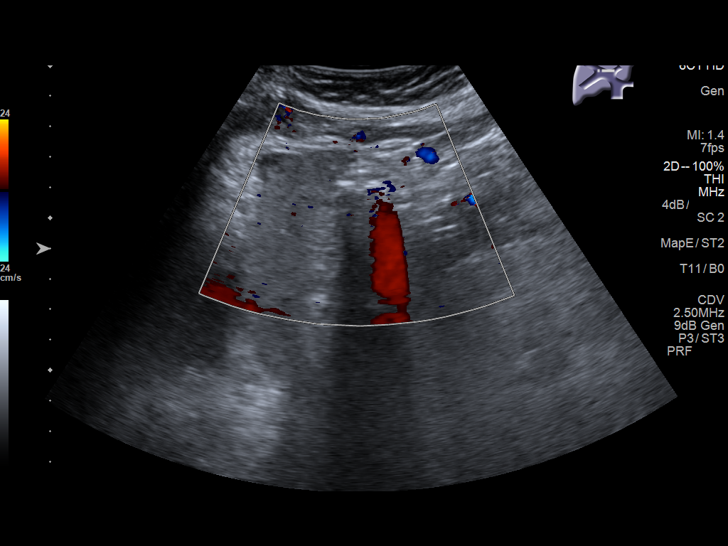
[im 61/146]
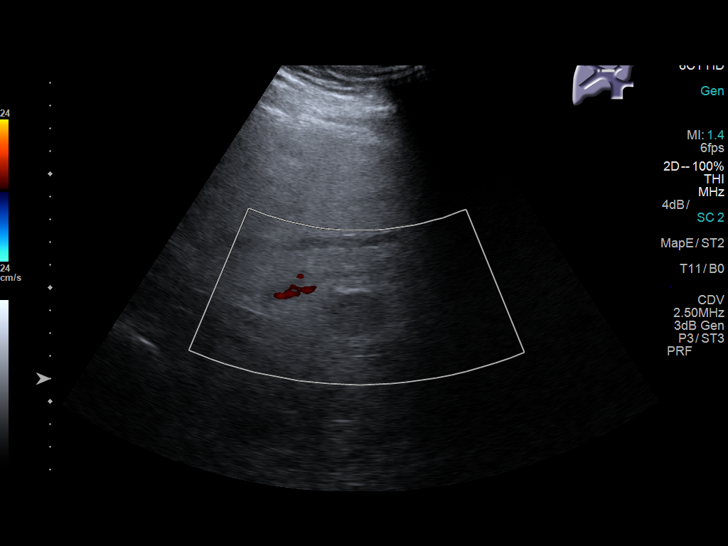
[im 73/146]
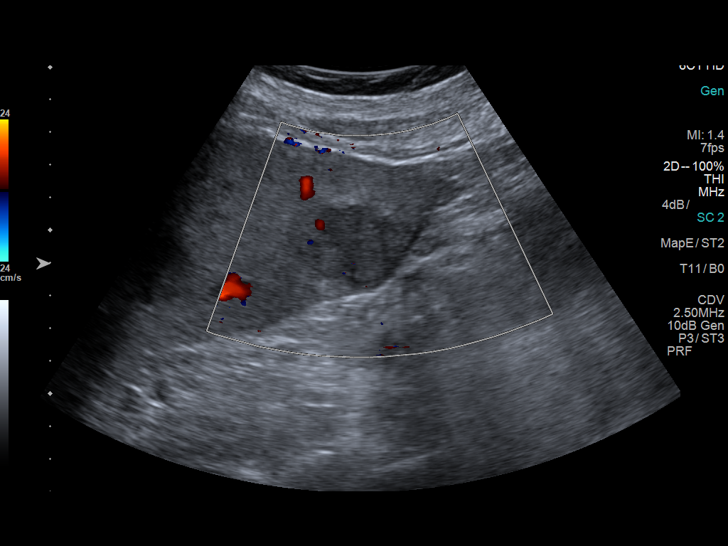
[im 85/146]
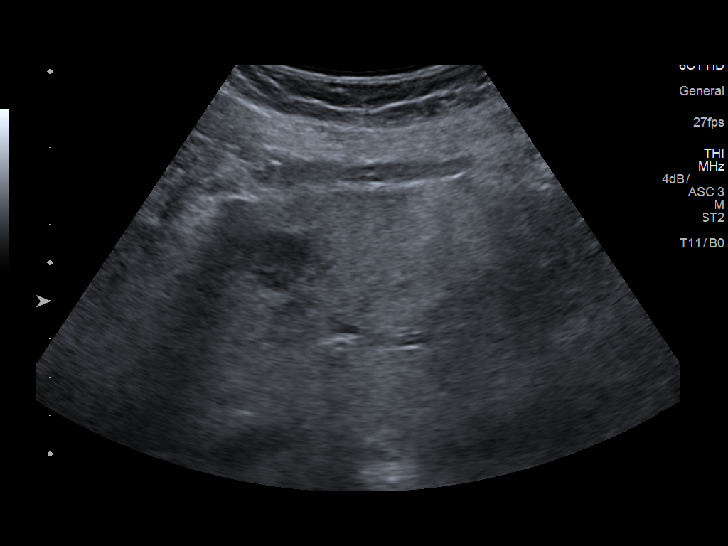
[im 97/146]
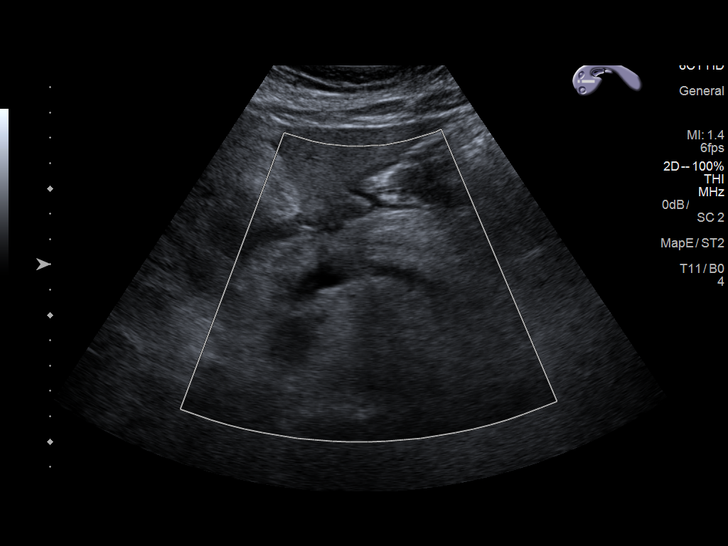
[im 109/146]
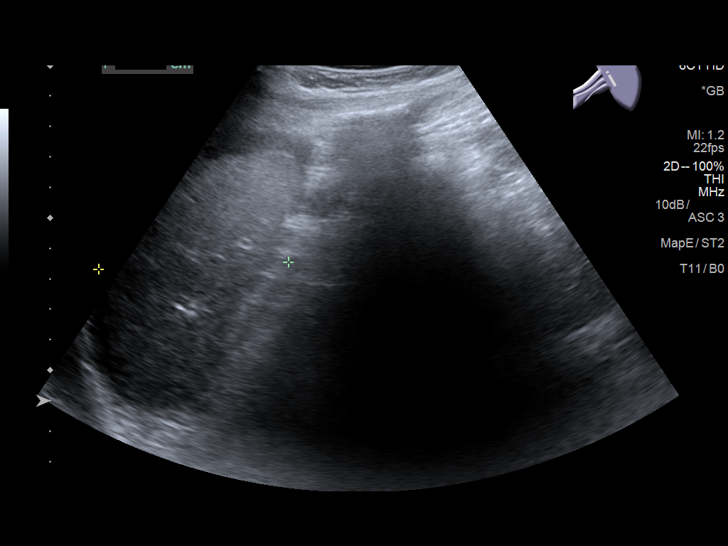
[im 121/146]
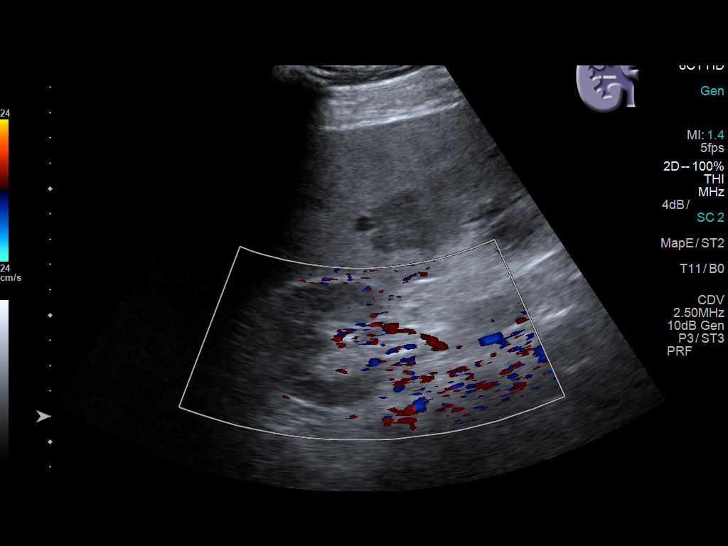
[im 133/146]
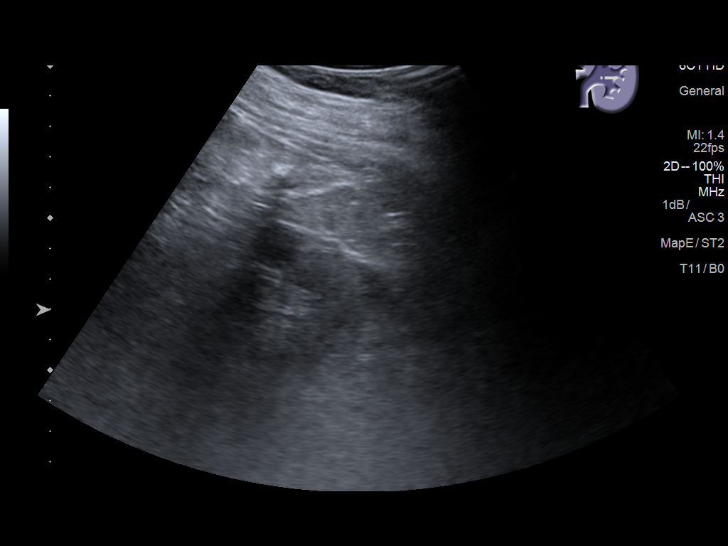
[im 146/146]
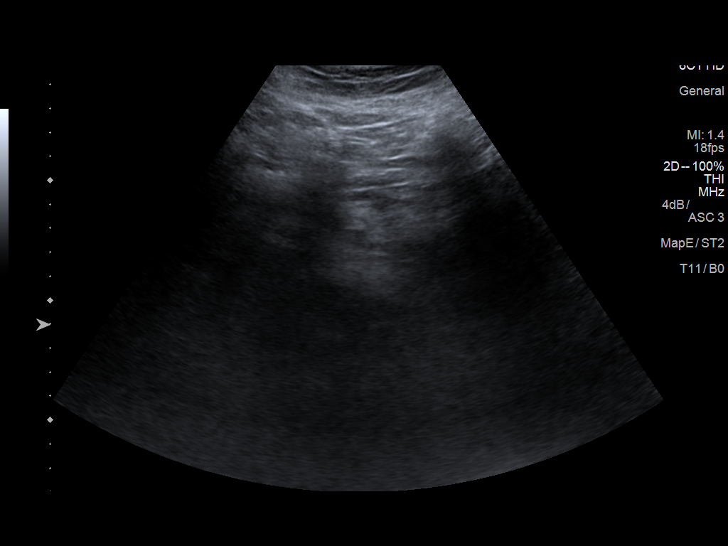

[13 of 25 positions shown; findings below may reference images not displayed]

FINDINGS: Gallbladder: Edematous and striated thickened wall measuring 11 mm.
Layering calculi that are small. Patient is ventilated.

Common bile duct: Diameter: 6 mm. Where visualized, no filling
defect.

Liver: 2 right and 1 left hypoechoic liver mass, likely metastatic
disease given chest findings from yesterday. The masses are
lobulated and measure up to 3.2 cm in the right liver. Portal vein
is patent on color Doppler imaging with normal direction of blood
flow towards the liver.

IVC: No abnormality visualized.

Pancreas: Visualized portion unremarkable.

Spleen: Size and appearance within normal limits.

Right Kidney: Length: 10 cm. Echogenicity within normal limits. No
mass or hydronephrosis visualized.

Left Kidney: Length: 9 cm. Echogenicity within normal limits. No
mass or hydronephrosis visualized.

Abdominal aorta: Atherosclerotic plaque.

Other findings: Small volume ascites.
IMPRESSION: 1. Three liver masses measuring up to 3.2 cm, favor metastatic
disease given chest CT findings yesterday.
2. Cholelithiasis. No visible choledocholithiasis or common bile
duct dilatation.
3. Prominent gallbladder wall edema. This could be reactive rather
than cholecystitis given the gallbladder is not over dilated.
Patient is intubated, limiting Murphy sign.
